# Patient Record
Sex: Female | Born: 1980 | Race: White | Hispanic: Yes | Marital: Married | Smoking: Former smoker
Health system: Southern US, Community
[De-identification: ages and names within clinical notes are randomized; demographics above are authoritative.]

## PROBLEM LIST (undated history)

## (undated) ENCOUNTER — Inpatient Hospital Stay (HOSPITAL_COMMUNITY): Payer: Self-pay

## (undated) DIAGNOSIS — F32A Depression, unspecified: Secondary | ICD-10-CM

## (undated) DIAGNOSIS — E119 Type 2 diabetes mellitus without complications: Secondary | ICD-10-CM

## (undated) DIAGNOSIS — IMO0002 Reserved for concepts with insufficient information to code with codable children: Secondary | ICD-10-CM

## (undated) DIAGNOSIS — A539 Syphilis, unspecified: Secondary | ICD-10-CM

## (undated) DIAGNOSIS — F419 Anxiety disorder, unspecified: Secondary | ICD-10-CM

## (undated) DIAGNOSIS — F3110 Bipolar disorder, current episode manic without psychotic features, unspecified: Secondary | ICD-10-CM

## (undated) DIAGNOSIS — A630 Anogenital (venereal) warts: Secondary | ICD-10-CM

## (undated) DIAGNOSIS — I10 Essential (primary) hypertension: Secondary | ICD-10-CM

## (undated) DIAGNOSIS — F329 Major depressive disorder, single episode, unspecified: Secondary | ICD-10-CM

## (undated) HISTORY — DX: Anogenital (venereal) warts: A63.0

## (undated) HISTORY — DX: Depression, unspecified: F32.A

## (undated) HISTORY — DX: Major depressive disorder, single episode, unspecified: F32.9

## (undated) HISTORY — DX: Type 2 diabetes mellitus without complications: E11.9

## (undated) HISTORY — DX: Syphilis, unspecified: A53.9

## (undated) HISTORY — DX: Essential (primary) hypertension: I10

## (undated) HISTORY — DX: Bipolar disorder, current episode manic without psychotic features, unspecified: F31.10

## (undated) HISTORY — DX: Reserved for concepts with insufficient information to code with codable children: IMO0002

---

## 2003-07-26 DIAGNOSIS — R87619 Unspecified abnormal cytological findings in specimens from cervix uteri: Secondary | ICD-10-CM

## 2003-07-26 DIAGNOSIS — IMO0002 Reserved for concepts with insufficient information to code with codable children: Secondary | ICD-10-CM

## 2003-07-26 HISTORY — DX: Reserved for concepts with insufficient information to code with codable children: IMO0002

## 2003-07-26 HISTORY — DX: Unspecified abnormal cytological findings in specimens from cervix uteri: R87.619

## 2008-03-04 ENCOUNTER — Other Ambulatory Visit: Admission: RE | Admit: 2008-03-04 | Discharge: 2008-03-04 | Payer: Self-pay | Admitting: Obstetrics and Gynecology

## 2008-03-22 ENCOUNTER — Emergency Department (HOSPITAL_COMMUNITY): Admission: EM | Admit: 2008-03-22 | Discharge: 2008-03-22 | Payer: Self-pay | Admitting: Emergency Medicine

## 2008-12-23 HISTORY — PX: CHOLECYSTECTOMY: SHX55

## 2008-12-29 ENCOUNTER — Emergency Department (HOSPITAL_COMMUNITY): Admission: EM | Admit: 2008-12-29 | Discharge: 2008-12-30 | Payer: Self-pay | Admitting: Emergency Medicine

## 2008-12-31 ENCOUNTER — Inpatient Hospital Stay (HOSPITAL_COMMUNITY): Admission: AD | Admit: 2008-12-31 | Discharge: 2009-01-03 | Payer: Self-pay

## 2009-01-01 ENCOUNTER — Encounter (INDEPENDENT_AMBULATORY_CARE_PROVIDER_SITE_OTHER): Payer: Self-pay | Admitting: General Surgery

## 2009-01-14 ENCOUNTER — Encounter: Admission: RE | Admit: 2009-01-14 | Discharge: 2009-01-14 | Payer: Self-pay | Admitting: General Surgery

## 2009-01-16 ENCOUNTER — Ambulatory Visit (HOSPITAL_COMMUNITY): Admission: RE | Admit: 2009-01-16 | Discharge: 2009-01-16 | Payer: Self-pay | Admitting: General Surgery

## 2009-06-29 ENCOUNTER — Ambulatory Visit (HOSPITAL_COMMUNITY): Admission: RE | Admit: 2009-06-29 | Discharge: 2009-06-29 | Payer: Self-pay | Admitting: Psychiatry

## 2010-11-01 LAB — CBC
HCT: 30.9 % — ABNORMAL LOW (ref 36.0–46.0)
HCT: 36.3 % (ref 36.0–46.0)
HCT: 38.4 % (ref 36.0–46.0)
Hemoglobin: 10.3 g/dL — ABNORMAL LOW (ref 12.0–15.0)
Hemoglobin: 12.2 g/dL (ref 12.0–15.0)
Hemoglobin: 12.9 g/dL (ref 12.0–15.0)
MCHC: 33.6 g/dL (ref 30.0–36.0)
RDW: 13.6 % (ref 11.5–15.5)
RDW: 14.2 % (ref 11.5–15.5)

## 2010-11-01 LAB — COMPREHENSIVE METABOLIC PANEL
AST: 49 U/L — ABNORMAL HIGH (ref 0–37)
Albumin: 3 g/dL — ABNORMAL LOW (ref 3.5–5.2)
Alkaline Phosphatase: 141 U/L — ABNORMAL HIGH (ref 39–117)
Alkaline Phosphatase: 85 U/L (ref 39–117)
Alkaline Phosphatase: 91 U/L (ref 39–117)
BUN: 5 mg/dL — ABNORMAL LOW (ref 6–23)
BUN: 8 mg/dL (ref 6–23)
BUN: 8 mg/dL (ref 6–23)
CO2: 27 mEq/L (ref 19–32)
CO2: 29 mEq/L (ref 19–32)
Calcium: 8.3 mg/dL — ABNORMAL LOW (ref 8.4–10.5)
Calcium: 9.1 mg/dL (ref 8.4–10.5)
Chloride: 107 mEq/L (ref 96–112)
Creatinine, Ser: 0.72 mg/dL (ref 0.4–1.2)
GFR calc Af Amer: >60 mL/min (ref >60)
GFR calc non Af Amer: >60 mL/min (ref >60)
GFR calc non Af Amer: >60 mL/min (ref >60)
GFR calc non Af Amer: >60 mL/min (ref >60)
Glucose, Bld: 103 mg/dL — ABNORMAL HIGH (ref 70–99)
Glucose, Bld: 145 mg/dL — ABNORMAL HIGH (ref 70–99)
Glucose, Bld: 90 mg/dL (ref 70–99)
Potassium: 3.9 mEq/L (ref 3.5–5.1)
Potassium: 4 mEq/L (ref 3.5–5.1)
Total Bilirubin: 0.5 mg/dL (ref 0.3–1.2)
Total Bilirubin: 1.1 mg/dL (ref 0.3–1.2)
Total Protein: 5.8 g/dL — ABNORMAL LOW (ref 6.0–8.3)
Total Protein: 6.1 g/dL (ref 6.0–8.3)
Total Protein: 7 g/dL (ref 6.0–8.3)

## 2010-11-01 LAB — DIFFERENTIAL
Basophils Relative: 0 % (ref 0–1)
Monocytes Relative: 5 % (ref 3–12)
Neutro Abs: 8.2 10*3/uL — ABNORMAL HIGH (ref 1.7–7.7)
Neutrophils Relative %: 69 % (ref 43–77)

## 2010-11-01 LAB — URINALYSIS, ROUTINE W REFLEX MICROSCOPIC
Ketones, ur: NEGATIVE mg/dL
Nitrite: NEGATIVE
Protein, ur: NEGATIVE mg/dL
Urobilinogen, UA: 1 mg/dL (ref 0.0–1.0)
pH: 5.5 (ref 5.0–8.0)

## 2010-11-01 LAB — LIPASE, BLOOD
Lipase: 14 U/L (ref 11–59)
Lipase: 18 U/L (ref 11–59)

## 2010-11-01 LAB — POCT PREGNANCY, URINE: Preg Test, Ur: NEGATIVE

## 2010-12-07 NOTE — Op Note (Signed)
NAME:  Diamond Pace, CHIHUAHUA NO.:  [id number]   MEDICAL RECORD NO.:  [id number]          PATIENT TYPE:  INP   LOCATION:  5151                         FACILITY:  [facility]   PHYSICIAN:  Almond Lint, MD       DATE OF BIRTH:  1981/04/16   DATE OF PROCEDURE:  01/01/2009  DATE OF DISCHARGE:                               OPERATIVE REPORT   PREOPERATIVE DIAGNOSIS:  Cholecystitis.   POSTOPERATIVE DIAGNOSIS:  Cholecystitis.   PROCEDURE PERFORMED:  Laparoscopic cholecystectomy.   SURGEON:  Almond Lint, MD   ASSISTANT:  Letha Cape, PA-C   ANESTHESIA:  General and local.   FINDINGS:  Inflamed, friable gallbladder and cystic duct.  This was  singly clipped as the duct tore apart when trying to apply the third  clip.   SPECIMEN:  Gallbladder to pathology.   ESTIMATED BLOOD LOSS:  Minimal.   COMPLICATIONS:  None known.   PROCEDURE:  Mr. Diamond Pace is a 30 year old female who was  identified in the holding area and taken to the operating room where she  was placed on supine on the operating room table.  General endotracheal  anesthesia was induced.  The abdomen was prepped and draped in a sterile  fashion.  Time-out was performed according to the surgical safety check  list.  When all was correct, we continued.  The infraumbilical skin was  anesthetized with a mixture of 1% lidocaine plain and 0.25% Marcaine  with epinephrine.  A transverse curvilinear incision was made  approximately 1.5 cm in length.  The subcutaneous tissues were divided  bluntly with a Kelly clamp.  The umbilical stalk was elevated with a  Kocher.  The fascia was noted to be slightly deeper so this was cleaned  off with retractors and grasped on either side of the midline.  A #11  blade was used to incise the midline.  A pursestring suture of 0 Vicryl  was placed around the fascial incision and Hasson trocar was introduced  into the abdomen.  The sutures were also used to secure the Hasson  to  the abdominal wall.  Pneumoperitoneum was achieved to a pressure of 15  mmHg.  The camera was introduced in the abdomen and the gallbladder was  immediately seen.  The epigastric region was identified and an incision  made after administration of local.  An 11-mm port was placed in the  epigastrium under direct visualization and two 5-mm ports were placed in  the right upper quadrant under direct visualization.  The gallbladder  was very difficult to retract as the patient's abdominal wall was very  thick and precluded good retraction.  The first attempts were made to  take the gallbladder dome down; however, the gallbladder was noted to be  very friable.  The peritoneum overlying the ductal structures was  removed and the [place] dissector in right angle were used to  skeletonize the cystic duct.  This was clipped once and the clip made a  hole in the gallbladder.  This was clipped again.  The gallbladder  continued to leak.  There was another clip placed inferior to these  clips and the  artery had been skeletonized and this was clipped and  transected as well.  Before an additional clip could be placed inferior  to the three clips on the duct, the cystic duct tore between the second  and third clips leaving only one clip present on the cystic duct stump.  The gallbladder was then taken off the gallbladder fossa with the Bovie  electrocautery.  There was no evidence of leakage from the clip that was  in place.  The gallbladder was removed from the abdominal cavity with  the EndoCatch bag through the umbilical incision.  The right upper  quadrant was irrigated copiously as well as along the right paracolic  gutter where the bile had spilled.  The gallbladder fossa was  reexamined.  There was no bleeding seen.  The cystic duct stump and the  cystic artery stump were examined and there was no leakage of bile or  bleeding seen at the clips.  There was a brief attempt to place an   additional clip on the cystic duct stump.  However, the periductal fatty  tissues began to come off and it was felt that it would be more prudent  to leave the ductal alone with the one clip in place.  The  pneumoperitoneum was then allowed to evacuate and the right upper  quadrant trocar and epigastric trocar was removed.  The fascia at the  umbilicus was closed with a pursestring suture with no additional defect  palpated.  The skin at each incision was closed with 4-0 Monocryl.  The  needle and sponge counts were correct x2.  Almond Lint, MD  Electronically Signed     FB/MEDQ  D:  01/01/2009  T:  01/02/2009  Job:  [id number]

## 2010-12-07 NOTE — H&P (Signed)
NAME:  Diamond Pace, Diamond Pace NO.:  [id number]   MEDICAL RECORD NO.:  [id number]          PATIENT TYPE:  INP   LOCATION:  5151                         FACILITY:  [facility]   PHYSICIAN:  Adolph Pollack, M.D.DATE OF BIRTH:  February 08, 1981   DATE OF ADMISSION:  12/31/2008  DATE OF DISCHARGE:                              HISTORY & PHYSICAL   CHIEF COMPLAINT:  Severe right upper quadrant pain, nausea, and  vomiting.   HISTORY:  This is a 30 year old female who presented to the emergency  department about a day to day and half ago with complaints as above with  right upper quadrant pain that is severe radiating to the back, nausea,  and vomiting.  While she was there she was noted to have significant  right upper quadrant tenderness, white cell count of 11,600 and  elevation of her SGOT and SGPT.  She was diagnosed with a presumptive  gallbladder disease.  No ultrasound was done.  She was subsequently sent  over here for further evaluation.  She states she continues to have the  pain and cannot keep anything down.  No fever.   PAST MEDICAL HISTORY:  No chronic illnesses.   OPERATIONS:  None.   ALLERGIES:  None.   MEDICATIONS:  Oxycodone, Zantac, and Phenergan.   SOCIAL HISTORY:  Married, 1 child, smokes half a pack of cigarettes a  day, no alcohol use.  Works as an Proofreader.   FAMILY HISTORY:  Notable for cancer and high blood pressure.   REVIEW OF SYSTEMS:  GENERAL:  She reports having some chills and night  sweats.  CARDIAC:  Negative.  PULMONARY:  Negative.  ENDOCRINE:  Negative.  HEMATOLOGIC:  Negative.  GU:  Negative.  NEUROLOGIC:  Has  intermittent headaches.   PHYSICAL EXAMINATION:  GENERAL:  Morbidly obese female, being 6 feet and  weighing 364 pounds.  Appears to be uncomfortable.  VITAL SIGNS:  Temperature is 97.6, blood pressure is 120/80, and pulse  is 64.  EYES:  Extraocular motions are intact.  No icterus.  RESPIRATORY:  Breath sounds  equal and clear.  Respirations unlabored.  CARDIOVASCULAR:  Regular rate, regular rhythm.  No murmur.  ABDOMEN:  Soft and obese with right upper quadrant tenderness and  guarding.  No obvious mass.  MUSCULOSKELETAL:  Some trace pretibial edema.   LABORATORY DATA:  White count 11,600.  SGOT is 94, SGPT is 55, total  bilirubin is 0.5, and alk phos is 85.  Lipase 18.   Urine pregnancy test negative.   IMPRESSION:  Right upper quadrant pain clinically and laboratory  consistent with evolving acute cholecystitis.   PLAN:  We will admit to the hospital.  We will get a stat abdominal  ultrasound.  Empirically, start IV antibiotics.  Very likely, she will  need a cholecystectomy in admission.      Adolph Pollack, M.D.  Electronically Signed     TJR/MEDQ  D:  12/31/2008  T:  01/01/2009  Job:  [id number]

## 2010-12-10 NOTE — Discharge Summary (Signed)
NAME:  Diamond Pace, Diamond Pace NO.:  [id number]   MEDICAL RECORD NO.:  [id number]          PATIENT TYPE:  INP   LOCATION:  5151                         FACILITY:  [facility]   PHYSICIAN:  Currie Paris, M.D.DATE OF BIRTH:  1981/01/08   DATE OF ADMISSION:  12/31/2008  DATE OF DISCHARGE:  01/03/2009                               DISCHARGE SUMMARY   ADMITTING PHYSICIAN:  Adolph Pollack, MD   OPERATING PHYSICIAN:  Almond Lint, MD   CHIEF COMPLAINT/REASON FOR ADMISSION:  Mr. Derrell Lolling is a 30 year old  female patient who had come to the ER on June 26, 27, and 28 with  complaints of right upper quadrant pain and nausea and vomiting.  Pain  radiating to the back.  Apparently at those visit it was stable enough  to go home.  She has had not any fever, but in the interim she has been  unable to keep any food or liquids down and ultrasound had not been done  on those admissions.  On exam, Dr. Abbey Chatters found the patient whose  abdomen was soft with right upper quadrant tenderness and guarding.  She  is obese.  Her white count was 11,900, SGOT 94, SGPT 55.  She was found  to have a diagnosis of acute cholecystitis on clinical exam.   HOSPITAL COURSE:  The patient was admitted, started on empiric IV  antibiotic therapy, and abdominal ultrasound was ordered to help clarify  diagnosis since this had not yet been done.  The ultrasound showed  common bile duct within normal limits, increased echo texture in the  liver, multiple stones with gallbladder wall thickening with a positive  sonographic Murphy sign.   The patient was subsequently taken to the OR on January 01, 2009, where she  underwent laparoscopic cholecystectomy by Dr. Donell Beers.  During the  procedure, the patient was found to have an inflamed friable gallbladder  and cystic duct singly clipped, it was very difficult to mobilize the  duct due to the small size.  Given these findings intraoperatively, Dr.  Donell Beers wanted  the patient followed closely for possible bile leak in the  postoperative period, so clinical exam and serial LFTs were followed.   By postoperative day #1, the patient was afebrile and vital signs were  stable.  Her white count was normal.  Her LFTs were trending down.  Total bilirubin was normal, but she was having increasing right upper  quadrant pain radiating to the back, which was similar to her  preoperative pain.  She was tolerating her diet and oral pain  medications, but she was exclusively tender with voluntary guarding in  the right upper quadrant.  We continued to watch her closely to make  sure she was not having signs of a bile leak.  By January 03, 2009,  postoperative day #2, Dr. Jamey Ripa evaluated the patient.  Her right upper  quadrant pain had diminished greatly and she was tolerating a diet.  Her  bowel sounds were present and it was determined that she was otherwise  deemed appropriate for discharge home.   FINAL DISCHARGE DIAGNOSIS:  Right upper quadrant abdominal pain  secondary to acute cholecystitis  status post laparoscopic  cholecystectomy with other issues as described.   DISCHARGE MEDICATIONS:  1. Oxycodone 5/325, 1-2 tablets every 4-6 hours as needed for pain.  2. Promethazine 25 mg every 4-6 hours as needed for nausea.  3. Zantac 75 mg daily.   ADDITIONAL INSTRUCTIONS:  No changes in diet.  The patient has Dermabond  over incisions.   ACTIVITY:  Increase activity slowly.  May walk up steps.  May shower.  No lifting for 2 weeks.  No driving for 2 weeks.   FOLLOWUP APPOINTMENT:  She needs to see Dr. Donell Beers in 2 weeks.  She  needs to call for an appointment.      Allison L. Rennis Harding, N.P.      Currie Paris, M.D.  Electronically Signed    ALE/MEDQ  D:  01/20/2009  T:  01/21/2009  Job:  [id number]   cc:   Almond Lint, MD

## 2011-11-07 ENCOUNTER — Other Ambulatory Visit: Payer: Self-pay | Admitting: Physician Assistant

## 2011-11-16 ENCOUNTER — Other Ambulatory Visit: Payer: Self-pay | Admitting: Physician Assistant

## 2011-12-15 ENCOUNTER — Other Ambulatory Visit: Payer: Self-pay | Admitting: Family Medicine

## 2011-12-15 ENCOUNTER — Other Ambulatory Visit: Payer: Self-pay | Admitting: Physician Assistant

## 2012-10-03 DIAGNOSIS — E785 Hyperlipidemia, unspecified: Secondary | ICD-10-CM | POA: Insufficient documentation

## 2012-10-03 DIAGNOSIS — I872 Venous insufficiency (chronic) (peripheral): Secondary | ICD-10-CM | POA: Insufficient documentation

## 2012-12-18 ENCOUNTER — Encounter: Payer: Self-pay | Admitting: Certified Nurse Midwife

## 2012-12-19 ENCOUNTER — Encounter: Payer: Self-pay | Admitting: Certified Nurse Midwife

## 2012-12-19 ENCOUNTER — Ambulatory Visit (INDEPENDENT_AMBULATORY_CARE_PROVIDER_SITE_OTHER): Payer: BC Managed Care – PPO | Admitting: Certified Nurse Midwife

## 2012-12-19 VITALS — BP 120/74 | HR 68 | Wt 379.0 lb

## 2012-12-19 DIAGNOSIS — N912 Amenorrhea, unspecified: Secondary | ICD-10-CM

## 2012-12-19 LAB — POCT URINE PREGNANCY: Preg Test, Ur: POSITIVE

## 2012-12-19 LAB — HCG, QUANTITATIVE, PREGNANCY: hCG, Beta Chain, Quant, S: 17.13 m[IU]/mL

## 2012-12-19 MED ORDER — CONCEPT OB 130-92.4-1 MG PO CAPS: 1.0000 | ORAL_CAPSULE | Freq: Every day | ORAL | Status: DC

## 2012-12-19 NOTE — Progress Notes (Signed)
  32 y.o.Married Hispanic female [id number] presents with no menses sinceMarch 28 which was spotting. Normal period 09-01-12 with consistent Micronor use. Has been on weight loss and PCP management for diabetes, hypertension, and depression.  Currently periods are occurring every month prior with Micronor use since 04/2012.  Periods were not regular in the past, occurring every 3 -6 months . Patient reports fatigued, loss of appetite and nipple tenderness.. Reports oral progesterone-only contraceptive, compliance until March with sporadic use. March 28 had spotting,no bleeding since that time., Has been sexually active without condom use. Has not taken her medications today for hypertension, diabetes and depression.  Wondered if she might be pregnant, due to not feeling herself. Urine pregnancy positive - very faint. No partner change in the past ? 6  Months. Not sure how she feels about possibility of pregnancy.   General appearance: alert, cooperative and morbidly obese Pelvic: External genital: normal BUS: negative Vagina: normal appearance, normal discharge Cervix: non tender, normal appearance Uterus: non tender,difficult to palpate due to body habitus Adnexa: no large masses, non tender, hard to palpate due to body habitus Perineal area: no lesions  Assessment:1-  Amenorrhea with very faint positive UPT 2-History of irregular cycles with Micronor use for contraception and cycle control 3-Micronor compliance issue for month of April 2014 4- History of SAB and ectopic( medically managed) 5-Type II Diabetes on medication 6-Hypertension on medication 7-Depression on medication 8-Unplanned pregnancy  Plan:1- Discussed with patient factors that may be contributory to menstrual abnormalities include hormonal factors non compliance of pills. Normal limited pelvic exam due morbid obesity.  Patient aware of limitations.  2-Patient unsure of feelings of questionable positive UPT Recommend Serum HCG  Quant.. Patient agreeable Labs: HCG quant. Discussed with patient all the medications she is taking and category of medications with pregnancy  and her health risk if pregnant. Medications will need to be altered or discontinued if results positive.  Patient will probably need high risk clinic management. Will hold on medications until lab back, patient has not been taking daily. Given prenatal vitamin sample to start [facility] one daily. Consulted with Dr Farrel Gobble regarding patient and she will follow up with her and agrees with management at this point. Patient advised to notify if vaginal bleeding or cramping.   Rv prn as ablove      Reviewed, TL

## 2012-12-20 ENCOUNTER — Other Ambulatory Visit: Payer: Self-pay | Admitting: Gynecology

## 2012-12-20 ENCOUNTER — Telehealth: Payer: Self-pay | Admitting: Certified Nurse Midwife

## 2012-12-20 DIAGNOSIS — N912 Amenorrhea, unspecified: Secondary | ICD-10-CM

## 2012-12-20 NOTE — Telephone Encounter (Signed)
Patient stated that she is waiting on a call from Joy with results from pregnancy test results. Patient stated that she needs to know if she is pregnant or not so that she can take her meds if she is not pregnant. Patient stated that she has not taken her meds since 1 pm 12/18/12.

## 2012-12-20 NOTE — Telephone Encounter (Signed)
Repeat HCG (STAT) scheduled for 12/21/12

## 2012-12-20 NOTE — Telephone Encounter (Signed)
Patient saw Debbi yesterday for amenorrhea and had faint pos UPT here. HCG 17, pt calling for results. Please advise.

## 2012-12-20 NOTE — Telephone Encounter (Signed)
Pt calling to schedule another appt for repeat hcg

## 2012-12-21 ENCOUNTER — Other Ambulatory Visit (INDEPENDENT_AMBULATORY_CARE_PROVIDER_SITE_OTHER): Payer: BC Managed Care – PPO

## 2012-12-21 DIAGNOSIS — N912 Amenorrhea, unspecified: Secondary | ICD-10-CM

## 2012-12-24 ENCOUNTER — Other Ambulatory Visit (INDEPENDENT_AMBULATORY_CARE_PROVIDER_SITE_OTHER): Payer: BC Managed Care – PPO

## 2012-12-24 ENCOUNTER — Other Ambulatory Visit: Payer: Self-pay | Admitting: Gynecology

## 2012-12-24 ENCOUNTER — Encounter: Payer: Self-pay | Admitting: Certified Nurse Midwife

## 2012-12-24 ENCOUNTER — Other Ambulatory Visit: Payer: Self-pay | Admitting: Orthopedic Surgery

## 2012-12-24 DIAGNOSIS — O09291 Supervision of pregnancy with other poor reproductive or obstetric history, first trimester: Secondary | ICD-10-CM

## 2012-12-24 DIAGNOSIS — N912 Amenorrhea, unspecified: Secondary | ICD-10-CM

## 2012-12-24 LAB — HCG, QUANTITATIVE, PREGNANCY: hCG, Beta Chain, Quant, S: 326 m[IU]/mL

## 2012-12-25 ENCOUNTER — Encounter: Payer: Self-pay | Admitting: Certified Nurse Midwife

## 2012-12-25 ENCOUNTER — Ambulatory Visit (INDEPENDENT_AMBULATORY_CARE_PROVIDER_SITE_OTHER): Payer: BC Managed Care – PPO | Admitting: Certified Nurse Midwife

## 2012-12-25 VITALS — BP 110/70 | HR 72 | Temp 97.9°F | Resp 16

## 2012-12-25 DIAGNOSIS — Z3201 Encounter for pregnancy test, result positive: Secondary | ICD-10-CM

## 2012-12-25 DIAGNOSIS — O200 Threatened abortion: Secondary | ICD-10-CM

## 2012-12-25 DIAGNOSIS — N912 Amenorrhea, unspecified: Secondary | ICD-10-CM

## 2012-12-25 DIAGNOSIS — E119 Type 2 diabetes mellitus without complications: Secondary | ICD-10-CM

## 2012-12-25 DIAGNOSIS — R350 Frequency of micturition: Secondary | ICD-10-CM

## 2012-12-25 LAB — POCT URINALYSIS DIPSTICK
Bilirubin, UA: NEGATIVE
Blood, UA: NEGATIVE
Glucose, UA: NEGATIVE
Nitrite, UA: NEGATIVE
Urobilinogen, UA: NEGATIVE
pH, UA: 5

## 2012-12-25 NOTE — Progress Notes (Signed)
32 yo married(but not together)hispanic female g3 p1021 with positive UPT and quantative HCG that is increasing, here complaining of cramping sporadic more so on left side, but occurs on right side.  Denies bleeding, vaginal discharge change or abdominal pain.  Denies nausea or vomiting, urinary frequency that is different, diarrhea,fever or chills.  Still has increasing breast and nipple tenderness. Taking prenatal vitamins, Aldomet for hypertension, recently changed form Lisinopril and Metformin HCL 500 mg.  Eating well, some fatigue. Describes discomfort as sporadic sharp quick pain,"almost like period getting ready to start". Checking blood glucose at home, all normal. LMP:10/17/12 history of irregular cycles  O: Healthy morbid obese  Affect: normal, orientation X3 Vital signs :110/70, P-72 R- 16  Urine essentially negative Temp:97.9 HCG: 5/28-17.13 5/30-49.7 6/2-326  Progesterone- 6  Exam: Abdomen: soft, no rebound, points to LLQ as feeling bruised when pain occurs.  No point of pain identified. Pelvic Exam: External genital area: normal no lesions BUS: negative Vagina: scant white discharge, no blood noted Cervix: closed, non tender, no blood present Uterus: non tender,mid position, difficult to palpate due to body habitus. Adnexae: non tender to touch, no large masses noted,due to body habitus Perineal area: no lesions  A:Early pregnancy with consistent increasing HCG levels? 5 weeks  No physical signs of threatened miscarriage per exam. Limited normal pelvic exam due to morbid obesity Unplanned pregnancy, but happy with partner support History of ectopic pregnancy with medication management Type 2 Diabetes on Metformin, good control Hypertension on Aldomet  P:Reviewed findings of no evidence of threatened miscarriage.  Warning signs and symptoms given and need to evaluate if occurs. Lab: HCG quant. And Hgb A1-c Discussed limitations of exam and PUS limitations at this  point. Continue medications as prescribed.  Continue good water intake. Consulted with Dr. Farrel Gobble who agrees with plan. Patient was seen by Dr.Lathrop who discussed concerns also with her.  Rv 12-28-12 repeat quant. Per Dr Farrel Gobble  Rv prn  Reviewed, TL

## 2012-12-26 ENCOUNTER — Encounter: Payer: Self-pay | Admitting: Gynecology

## 2012-12-27 ENCOUNTER — Other Ambulatory Visit: Payer: Self-pay | Admitting: Certified Nurse Midwife

## 2012-12-27 DIAGNOSIS — Z3201 Encounter for pregnancy test, result positive: Secondary | ICD-10-CM

## 2012-12-28 ENCOUNTER — Ambulatory Visit (INDEPENDENT_AMBULATORY_CARE_PROVIDER_SITE_OTHER): Payer: BC Managed Care – PPO | Admitting: Certified Nurse Midwife

## 2012-12-28 ENCOUNTER — Encounter: Payer: Self-pay | Admitting: Certified Nurse Midwife

## 2012-12-28 DIAGNOSIS — Z3201 Encounter for pregnancy test, result positive: Secondary | ICD-10-CM

## 2012-12-31 ENCOUNTER — Telehealth: Payer: Self-pay | Admitting: Certified Nurse Midwife

## 2012-12-31 NOTE — Telephone Encounter (Signed)
Patient is waiting to here about her appointment for [facility]

## 2012-12-31 NOTE — Telephone Encounter (Signed)
U/S appointment scheduled for 01/01/13 cm

## 2013-01-01 ENCOUNTER — Ambulatory Visit (INDEPENDENT_AMBULATORY_CARE_PROVIDER_SITE_OTHER): Payer: BC Managed Care – PPO

## 2013-01-01 ENCOUNTER — Ambulatory Visit (INDEPENDENT_AMBULATORY_CARE_PROVIDER_SITE_OTHER): Payer: BC Managed Care – PPO | Admitting: Gynecology

## 2013-01-01 ENCOUNTER — Other Ambulatory Visit: Payer: Self-pay | Admitting: Gynecology

## 2013-01-01 VITALS — BP 128/78

## 2013-01-01 DIAGNOSIS — N912 Amenorrhea, unspecified: Secondary | ICD-10-CM

## 2013-01-01 DIAGNOSIS — O09291 Supervision of pregnancy with other poor reproductive or obstetric history, first trimester: Secondary | ICD-10-CM

## 2013-01-01 DIAGNOSIS — O09299 Supervision of pregnancy with other poor reproductive or obstetric history, unspecified trimester: Secondary | ICD-10-CM

## 2013-01-01 DIAGNOSIS — N831 Corpus luteum cyst of ovary, unspecified side: Secondary | ICD-10-CM

## 2013-01-01 DIAGNOSIS — O0911 Supervision of pregnancy with history of ectopic or molar pregnancy, first trimester: Secondary | ICD-10-CM | POA: Insufficient documentation

## 2013-01-01 DIAGNOSIS — Z3201 Encounter for pregnancy test, result positive: Secondary | ICD-10-CM

## 2013-01-01 DIAGNOSIS — I10 Essential (primary) hypertension: Secondary | ICD-10-CM | POA: Insufficient documentation

## 2013-01-01 DIAGNOSIS — E119 Type 2 diabetes mellitus without complications: Secondary | ICD-10-CM

## 2013-01-01 DIAGNOSIS — Z794 Long term (current) use of insulin: Secondary | ICD-10-CM | POA: Insufficient documentation

## 2013-01-01 DIAGNOSIS — O9989 Other specified diseases and conditions complicating pregnancy, childbirth and the puerperium: Secondary | ICD-10-CM

## 2013-01-01 DIAGNOSIS — O0910 Supervision of pregnancy with history of ectopic or molar pregnancy, unspecified trimester: Secondary | ICD-10-CM

## 2013-01-01 LAB — US OB TRANSVAGINAL

## 2013-01-01 MED ORDER — PROGESTERONE 50 MG VA SUPP: 1.0000 | Freq: Two times a day (BID) | VAGINAL | Status: DC

## 2013-01-01 NOTE — Progress Notes (Signed)
Pt here for viabiltiy u/s, BHCG's rising appropriately but P4 remains low, was 6 now 4.1.  Pt with history of irregular menses in past. U/S images reviewed with pt and daughter, gestational sac appropriately placed in cavity, yolk sac seen, no fetal pole was noted, no mass in either adnexa, CL cyst on left. Pt's discomfort earlier was on left, now assured with CL cyst. Discussed concerns regarding low progesterone on 2 samples, recommend supplementing progesterone vaginally until viability can be confirmed, pt is aware this will distort her symptoms. Pt instructed on placement, we will rx enough to get to next u/s next week, and will refill if fetal pole seen. BP good today on aldomet, she is to continue her metformin.

## 2013-01-04 DIAGNOSIS — K219 Gastro-esophageal reflux disease without esophagitis: Secondary | ICD-10-CM | POA: Insufficient documentation

## 2013-01-10 ENCOUNTER — Other Ambulatory Visit: Payer: Self-pay | Admitting: Gynecology

## 2013-01-10 ENCOUNTER — Encounter: Payer: Self-pay | Admitting: Certified Nurse Midwife

## 2013-01-10 MED ORDER — PROGESTERONE 50 MG VA SUPP: 1.0000 | Freq: Two times a day (BID) | VAGINAL | Status: DC

## 2013-01-14 ENCOUNTER — Telehealth: Payer: Self-pay | Admitting: *Deleted

## 2013-01-14 NOTE — Telephone Encounter (Addendum)
Patient is ready to schedule pus appointment. Patients says Dr. Farrel Gobble told her she could have her pus today 01/14/13 or Wed. 6/252014. Patient says no one has called her to schedule.

## 2013-01-14 NOTE — Telephone Encounter (Signed)
Patient calling about ultrasound appointment that Dr Farrel Gobble told her would be this week.  Patient states she needs to know this now because "she is putting her job in jeopardy". Advised i could not speak to the job status but PUS scheduled for tomm at 5pm as last available appt this week for PUS.

## 2013-01-14 NOTE — Telephone Encounter (Signed)
This call (originally on 12-31-12) was resolved with appt on 01-01-13.  Please see next phone note regarding next PUS appt. @ week follow up appt is scheduled for 01-15-13.

## 2013-01-15 ENCOUNTER — Ambulatory Visit (INDEPENDENT_AMBULATORY_CARE_PROVIDER_SITE_OTHER): Payer: BC Managed Care – PPO | Admitting: Gynecology

## 2013-01-15 ENCOUNTER — Other Ambulatory Visit: Payer: Self-pay | Admitting: *Deleted

## 2013-01-15 ENCOUNTER — Ambulatory Visit (INDEPENDENT_AMBULATORY_CARE_PROVIDER_SITE_OTHER): Payer: BC Managed Care – PPO

## 2013-01-15 DIAGNOSIS — N912 Amenorrhea, unspecified: Secondary | ICD-10-CM

## 2013-01-15 DIAGNOSIS — I10 Essential (primary) hypertension: Secondary | ICD-10-CM

## 2013-01-15 DIAGNOSIS — O9989 Other specified diseases and conditions complicating pregnancy, childbirth and the puerperium: Secondary | ICD-10-CM

## 2013-01-15 DIAGNOSIS — E119 Type 2 diabetes mellitus without complications: Secondary | ICD-10-CM

## 2013-01-15 DIAGNOSIS — O09299 Supervision of pregnancy with other poor reproductive or obstetric history, unspecified trimester: Secondary | ICD-10-CM

## 2013-01-15 DIAGNOSIS — Z3201 Encounter for pregnancy test, result positive: Secondary | ICD-10-CM

## 2013-01-15 DIAGNOSIS — O09291 Supervision of pregnancy with other poor reproductive or obstetric history, first trimester: Secondary | ICD-10-CM

## 2013-01-15 LAB — US OB TRANSVAGINAL

## 2013-01-15 MED ORDER — PROGESTERONE 50 MG VA SUPP: 1.0000 | Freq: Two times a day (BID) | VAGINAL | Status: DC

## 2013-01-15 NOTE — Patient Instructions (Signed)
Please make appt with [facility] doctor

## 2013-01-15 NOTE — Progress Notes (Signed)
Pt here for viability u/s last scan failed to show a fetal pole, pt was found to have low progesterone of 6 and repeat also low 4.1, she is on supplemental vaginal progesterone twice a day. U/s shows an SIUP, [redacted]w[redacted]d c/w LMP, FHR 158 well positioned in fundus Pt is pleasesd. We suggest she continue the progesterone support for the first trimester and she is agreeable, we refilled #30 x2. We encourage her to make an appointment with OB, dicussued high risk based on HTN, DM but good HbA1C, recommend she discuss with new MD regarding diet and exercise.  Pt is doing well on new BP medication Congradulations given

## 2013-01-17 ENCOUNTER — Encounter (HOSPITAL_COMMUNITY): Payer: Self-pay | Admitting: *Deleted

## 2013-01-17 ENCOUNTER — Inpatient Hospital Stay (HOSPITAL_COMMUNITY)
Admission: AD | Admit: 2013-01-17 | Discharge: 2013-01-17 | Disposition: A | Discharge status: Home / Self Care | Payer: BC Managed Care – PPO | Source: Ambulatory Visit | Attending: Gynecology | Admitting: Gynecology

## 2013-01-17 ENCOUNTER — Inpatient Hospital Stay (HOSPITAL_COMMUNITY): Payer: BC Managed Care – PPO

## 2013-01-17 DIAGNOSIS — R109 Unspecified abdominal pain: Secondary | ICD-10-CM | POA: Insufficient documentation

## 2013-01-17 DIAGNOSIS — O4690 Antepartum hemorrhage, unspecified, unspecified trimester: Secondary | ICD-10-CM

## 2013-01-17 DIAGNOSIS — M549 Dorsalgia, unspecified: Secondary | ICD-10-CM | POA: Insufficient documentation

## 2013-01-17 DIAGNOSIS — O24919 Unspecified diabetes mellitus in pregnancy, unspecified trimester: Secondary | ICD-10-CM | POA: Insufficient documentation

## 2013-01-17 DIAGNOSIS — Y92009 Unspecified place in unspecified non-institutional (private) residence as the place of occurrence of the external cause: Secondary | ICD-10-CM | POA: Insufficient documentation

## 2013-01-17 DIAGNOSIS — E119 Type 2 diabetes mellitus without complications: Secondary | ICD-10-CM | POA: Insufficient documentation

## 2013-01-17 DIAGNOSIS — O99891 Other specified diseases and conditions complicating pregnancy: Secondary | ICD-10-CM | POA: Insufficient documentation

## 2013-01-17 DIAGNOSIS — O10019 Pre-existing essential hypertension complicating pregnancy, unspecified trimester: Secondary | ICD-10-CM | POA: Insufficient documentation

## 2013-01-17 DIAGNOSIS — IMO0002 Reserved for concepts with insufficient information to code with codable children: Secondary | ICD-10-CM | POA: Insufficient documentation

## 2013-01-17 DIAGNOSIS — O209 Hemorrhage in early pregnancy, unspecified: Secondary | ICD-10-CM | POA: Insufficient documentation

## 2013-01-17 HISTORY — DX: Major depressive disorder, single episode, unspecified: F32.9

## 2013-01-17 HISTORY — DX: Depression, unspecified: F32.A

## 2013-01-17 HISTORY — DX: Anxiety disorder, unspecified: F41.9

## 2013-01-17 LAB — WET PREP, GENITAL
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
## 2013-01-29 DIAGNOSIS — Z3490 Encounter for supervision of normal pregnancy, unspecified, unspecified trimester: Secondary | ICD-10-CM | POA: Insufficient documentation

## 2013-01-29 NOTE — Progress Notes (Signed)
Patient not seen.

## 2013-01-30 ENCOUNTER — Encounter: Payer: Self-pay | Admitting: Gynecology

## 2013-02-05 ENCOUNTER — Ambulatory Visit (INDEPENDENT_AMBULATORY_CARE_PROVIDER_SITE_OTHER): Payer: BC Managed Care – PPO | Admitting: Family Medicine

## 2013-02-05 VITALS — BP 159/92 | Temp 98.8°F | Wt 387.4 lb

## 2013-02-05 DIAGNOSIS — O99211 Obesity complicating pregnancy, first trimester: Secondary | ICD-10-CM

## 2013-02-05 DIAGNOSIS — O99210 Obesity complicating pregnancy, unspecified trimester: Secondary | ICD-10-CM | POA: Insufficient documentation

## 2013-02-05 DIAGNOSIS — O24919 Unspecified diabetes mellitus in pregnancy, unspecified trimester: Secondary | ICD-10-CM

## 2013-02-05 DIAGNOSIS — O10019 Pre-existing essential hypertension complicating pregnancy, unspecified trimester: Secondary | ICD-10-CM

## 2013-02-05 DIAGNOSIS — O10011 Pre-existing essential hypertension complicating pregnancy, first trimester: Secondary | ICD-10-CM

## 2013-02-05 DIAGNOSIS — O0990 Supervision of high risk pregnancy, unspecified, unspecified trimester: Secondary | ICD-10-CM

## 2013-02-05 DIAGNOSIS — O24911 Unspecified diabetes mellitus in pregnancy, first trimester: Secondary | ICD-10-CM

## 2013-02-05 DIAGNOSIS — E669 Obesity, unspecified: Secondary | ICD-10-CM

## 2013-02-05 LAB — POCT URINALYSIS DIP (DEVICE)
Ketones, ur: NEGATIVE mg/dL
Leukocytes, UA: NEGATIVE
Protein, ur: NEGATIVE mg/dL
Specific Gravity, Urine: 1.02 (ref 1.005–1.030)
Urobilinogen, UA: 0.2 mg/dL (ref 0.0–1.0)
pH: 6 (ref 5.0–8.0)

## 2013-02-05 MED ORDER — ACCU-CHEK FASTCLIX LANCETS MISC: 1.0000 [IU] | Freq: Four times a day (QID) | Status: DC

## 2013-02-05 MED ORDER — GLUCOSE BLOOD VI STRP: ORAL_STRIP | Status: DC

## 2013-02-05 MED ORDER — ACCU-CHEK NANO SMARTVIEW W/DEVICE KIT: 1.0000 | PACK | Status: DC

## 2013-02-05 NOTE — Progress Notes (Signed)
Pulse- 84  Edema-feet  Pain-"mild cramps" Weight gain 11-20lbs  New ob packet given

## 2013-02-05 NOTE — Patient Instructions (Addendum)
Following an appropriate diet and keeping your blood sugar under control is the most important thing to do for your health and that of your unborn baby.  Please check your blood sugar 4 times daily.  Please keep accurate BS logs and bring them with you to every visit.  Please bring your meter also.  Goals for Blood sugar should be: 1. Fasting (first thing in the morning before eating) should be less than 90.   2.  2 hours after meals should be less than 120.  Please eat 3 meals and 3 snacks.  Include protein (meat, dairy-cheese, eggs, nuts) with all meals.  Be mindful that carbohydrates increase your blood sugar.  Not just sweet food (cookies, cake, donuts, fruit, juice, soda) but also bread, pasta, rice, and potatoes.  You have to limit how many carbs you are eating.  Adding exercise, as little as 30 minutes a day can decrease your blood sugar.  Pregnancy - First Trimester During sexual intercourse, millions of sperm go into the vagina. Only 1 sperm will penetrate and fertilize the female egg while it is in the Fallopian tube. One week later, the fertilized egg implants into the wall of the uterus. An embryo begins to develop into a baby. At 6 to 8 weeks, the eyes and face are formed and the heartbeat can be seen on ultrasound. At the end of 12 weeks (first trimester), all the baby's organs are formed. Now that you are pregnant, you will want to do everything you can to have a healthy baby. Two of the most important things are to get good prenatal care and follow your caregiver's instructions. Prenatal care is all the medical care you receive before the baby's birth. It is given to prevent, find, and treat problems during the pregnancy and childbirth. PRENATAL EXAMS  During prenatal visits, your weight, blood pressure, and urine are checked. This is done to make sure you are healthy and progressing normally during the pregnancy.  A pregnant woman should gain 25 to 35 pounds during the  pregnancy. However, if you are overweight or underweight, your caregiver will advise you regarding your weight.  Your caregiver will ask and answer questions for you.  Blood work, cervical cultures, other necessary tests, and a Pap test are done during your prenatal exams. These tests are done to check on your health and the probable health of your baby. Tests are strongly recommended and done for HIV with your permission. This is the virus that causes AIDS. These tests are done because medicines can be given to help prevent your baby from being born with this infection should you have been infected without knowing it. Blood work is also used to find out your blood type, previous infections, and follow your blood levels (hemoglobin).  Low hemoglobin (anemia) is common during pregnancy. Iron and vitamins are given to help prevent this. Later in the pregnancy, blood tests for diabetes will be done along with any other tests if any problems develop.  You may need other tests to make sure you and the baby are doing well. CHANGES DURING THE FIRST TRIMESTER  Your body goes through many changes during pregnancy. They vary from person to person. Talk to your caregiver about changes you notice and are concerned about. Changes can include:  Your menstrual period stops.  The egg and sperm carry the genes that determine what you look like. Genes from you and your partner are forming a baby. The female genes determine whether the baby  is a boy or a girl.  Your body increases in girth and you may feel bloated.  Feeling sick to your stomach (nauseous) and throwing up (vomiting). If the vomiting is uncontrollable, call your caregiver.  Your breasts will begin to enlarge and become tender.  Your nipples may stick out more and become darker.  The need to urinate more. Painful urination may mean you have a bladder infection.  Tiring easily.  Loss of appetite.  Cravings for certain kinds of food.  At  first, you may gain or lose a couple of pounds.  You may have changes in your emotions from day to day (excited to be pregnant or concerned something may go wrong with the pregnancy and baby).  You may have more vivid and strange dreams. HOME CARE INSTRUCTIONS   It is very important to avoid all smoking, alcohol and non-prescribed drugs during your pregnancy. These affect the formation and growth of the baby. Avoid chemicals while pregnant to ensure the delivery of a healthy infant.  Start your prenatal visits by the 12th week of pregnancy. They are usually scheduled monthly at first, then more often in the last 2 months before delivery. Keep your caregiver's appointments. Follow your caregiver's instructions regarding medicine use, blood and lab tests, exercise, and diet.  During pregnancy, you are providing food for you and your baby. Eat regular, well-balanced meals. Choose foods such as meat, fish, milk and other low fat dairy products, vegetables, fruits, and whole-grain breads and cereals. Your caregiver will tell you of the ideal weight gain.  You can help morning sickness by keeping soda crackers at the bedside. Eat a couple before arising in the morning. You may want to use the crackers without salt on them.  Eating 4 to 5 small meals rather than 3 large meals a day also may help the nausea and vomiting.  Drinking liquids between meals instead of during meals also seems to help nausea and vomiting.  A physical sexual relationship may be continued throughout pregnancy if there are no other problems. Problems may be early (premature) leaking of amniotic fluid from the membranes, vaginal bleeding, or belly (abdominal) pain.  Exercise regularly if there are no restrictions. Check with your caregiver or physical therapist if you are unsure of the safety of some of your exercises. Greater weight gain will occur in the last 2 trimesters of pregnancy. Exercising will help:  Control your  weight.  Keep you in shape.  Prepare you for labor and delivery.  Help you lose your pregnancy weight after you deliver your baby.  Wear a good support or jogging bra for breast tenderness during pregnancy. This may help if worn during sleep too.  Ask when prenatal classes are available. Begin classes when they are offered.  Do not use hot tubs, steam rooms, or saunas.  Wear your seat belt when driving. This protects you and your baby if you are in an accident.  Avoid raw meat, uncooked cheese, cat litter boxes, and soil used by cats throughout the pregnancy. These carry germs that can cause birth defects in the baby.  The first trimester is a good time to visit your dentist for your dental health. Getting your teeth cleaned is okay. Use a softer toothbrush and brush gently during pregnancy.  Ask for help if you have financial, counseling, or nutritional needs during pregnancy. Your caregiver will be able to offer counseling for these needs as well as refer you for other special needs.  Do not  take any medicines or herbs unless told by your caregiver.  Inform your caregiver if there is any mental or physical domestic violence.  Make a list of emergency phone numbers of family, friends, hospital, and police and fire departments.  Write down your questions. Take them to your prenatal visit.  Do not douche.  Do not cross your legs.  If you have to stand for long periods of time, rotate you feet or take small steps in a circle.  You may have more vaginal secretions that may require a sanitary pad. Do not use tampons or scented sanitary pads. MEDICINES AND DRUG USE IN PREGNANCY  Take prenatal vitamins as directed. The vitamin should contain 1 milligram of folic acid. Keep all vitamins out of reach of children. Only a couple vitamins or tablets containing iron may be fatal to a baby or young child when ingested.  Avoid use of all medicines, including herbs, over-the-counter  medicines, not prescribed or suggested by your caregiver. Only take over-the-counter or prescription medicines for pain, discomfort, or fever as directed by your caregiver. Do not use aspirin, ibuprofen, or naproxen unless directed by your caregiver.  Let your caregiver also know about herbs you may be using.  Alcohol is related to a number of birth defects. This includes fetal alcohol syndrome. All alcohol, in any form, should be avoided completely. Smoking will cause low birth rate and premature babies.  Street or illegal drugs are very harmful to the baby. They are absolutely forbidden. A baby born to an addicted mother will be addicted at birth. The baby will go through the same withdrawal an adult does.  Let your caregiver know about any medicines that you have to take and for what reason you take them. SEEK MEDICAL CARE IF:  You have any concerns or worries during your pregnancy. It is better to call with your questions if you feel they cannot wait, rather than worry about them. SEEK IMMEDIATE MEDICAL CARE IF:   An unexplained oral temperature above 102 F (38.9 C) develops, or as your caregiver suggests.  You have leaking of fluid from the vagina (birth canal). If leaking membranes are suspected, take your temperature and inform your caregiver of this when you call.  There is vaginal spotting or bleeding. Notify your caregiver of the amount and how many pads are used.  You develop a bad smelling vaginal discharge with a change in the color.  You continue to feel sick to your stomach (nauseated) and have no relief from remedies suggested. You vomit blood or coffee ground-like materials.  You lose more than 2 pounds of weight in 1 week.  You gain more than 2 pounds of weight in 1 week and you notice swelling of your face, hands, feet, or legs.  You gain 5 pounds or more in 1 week (even if you do not have swelling of your hands, face, legs, or feet).  You get exposed to [place]  measles and have never had them.  You are exposed to fifth disease or chickenpox.  You develop belly (abdominal) pain. Round ligament discomfort is a common non-cancerous (benign) cause of abdominal pain in pregnancy. Your caregiver still must evaluate this.  You develop headache, fever, diarrhea, pain with urination, or shortness of breath.  You fall or are in a car accident or have any kind of trauma.  There is mental or physical violence in your home. Document Released: 07/05/2001 Document Revised: 04/04/2012 Document Reviewed: 01/06/2009 [facility] Patient Information 2014 [place], [place].  Breastfeeding A change in hormones during your pregnancy causes growth of your breast tissue and an increase in number and size of milk ducts. The hormone prolactin allows proteins, sugars, and fats from your blood supply to make breast milk in your milk-producing glands. The hormone progesterone prevents breast milk from being released before the birth of your baby. After the birth of your baby, your progesterone level decreases allowing breast milk to be released. Thoughts of your baby, as well as his or her sucking or crying, can stimulate the release of milk from the milk-producing glands. Deciding to breastfeed (nurse) is one of the best choices you can make for you and your baby. The information that follows gives a brief review of the benefits, as well as other important skills to know about breastfeeding. BENEFITS OF BREASTFEEDING For your baby  The first milk (colostrum) helps your baby's digestive system function better.   There are antibodies in your milk that help your baby fight off infections.   Your baby has a lower incidence of asthma, allergies, and sudden infant death syndrome (SIDS).   The nutrients in breast milk are better for your baby than infant formulas.  Breast milk improves your baby's brain development.   Your baby will have less gas, colic, and constipation.  Your  baby is less likely to develop other conditions, such as childhood obesity, asthma, or diabetes mellitus. For you  Breastfeeding helps develop a very special bond between you and your baby.   Breastfeeding is convenient, always available at the correct temperature, and costs nothing.   Breastfeeding helps to burn calories and helps you lose the weight gained during pregnancy.   Breastfeeding makes your uterus contract back down to normal size faster and slows bleeding following delivery.   Breastfeeding mothers have a lower risk of developing osteoporosis or breast or ovarian cancer later in life.  BREASTFEEDING FREQUENCY  A healthy, full-term baby may breastfeed as often as every hour or space his or her feedings to every 3 hours. Breastfeeding frequency will vary from baby to baby.   Newborns should be fed no less than every 2 3 hours during the day and every 4 5 hours during the night. You should breastfeed a minimum of 8 feedings in a 24 hour period.  Awaken your baby to breastfeed if it has been 3 4 hours since the last feeding.  Breastfeed when you feel the need to reduce the fullness of your breasts or when your newborn shows signs of hunger. Signs that your baby may be hungry include:  Increased alertness or activity.  Stretching.  Movement of the head from side to side.  Movement of the head and opening of the mouth when the corner of the mouth or cheek is stroked (rooting).  Increased sucking sounds, smacking lips, cooing, sighing, or squeaking.  Hand-to-mouth movements.  Increased sucking of fingers or hands.  Fussing.  Intermittent crying.  Signs of extreme hunger will require calming and consoling before you try to feed your baby. Signs of extreme hunger may include:  Restlessness.  A loud, strong cry.  Screaming.  Frequent feeding will help you make more milk and will help prevent problems, such as sore nipples and engorgement of the breasts.   BREASTFEEDING   Whether lying down or sitting, be sure that the baby's abdomen is facing your abdomen.   Support your breast with 4 fingers under your breast and your thumb above your nipple. Make sure your fingers are well away  from your nipple and your baby's mouth.   Stroke your baby's lips gently with your finger or nipple.   When your baby's mouth is open wide enough, place all of your nipple and as much of the colored area around your nipple (areola) as possible into your baby's mouth.  More areola should be visible above his or her upper lip than below his or her lower lip.  Your baby's tongue should be between his or her lower gum and your breast.  Ensure that your baby's mouth is correctly positioned around the nipple (latched). Your baby's lips should create a seal on your breast.  Signs that your baby has effectively latched onto your nipple include:  Tugging or sucking without pain.  Swallowing heard between sucks.  Absent click or smacking sound.  Muscle movement above and in front of his or her ears with sucking.  Your baby must suck about 2 3 minutes in order to get your milk. Allow your baby to feed on each breast as long as he or she wants. Nurse your baby until he or she unlatches or falls asleep at the first breast, then offer the second breast.  Signs that your baby is full and satisfied include:  A gradual decrease in the number of sucks or complete cessation of sucking.  Falling asleep.  Extension or relaxation of his or her body.  Retention of a small amount of milk in his or her mouth.  Letting go of your breast by himself or herself.  Signs of effective breastfeeding in you include:  Breasts that have increased firmness, weight, and size prior to feeding.  Breasts that are softer after nursing.  Increased milk volume, as well as a change in milk consistency and color by the 5th day of breastfeeding.  Breast fullness relieved by  breastfeeding.  Nipples are not sore, cracked, or bleeding.  If needed, break the suction by putting your finger into the corner of your baby's mouth and sliding your finger between his or her gums. Then, remove your breast from his or her mouth.  It is common for babies to spit up a small amount after a feeding.  Babies often swallow air during feeding. This can make babies fussy. Burping your baby between breasts can help with this.  Vitamin D supplements are recommended for babies who get only breast milk.  Avoid using a pacifier during your baby's first 4 6 weeks.  Avoid supplemental feedings of water, formula, or juice in place of breastfeeding. Breast milk is all the food your baby needs. It is not necessary for your baby to have water or formula. Your breasts will make more milk if supplemental feedings are avoided during the early weeks. HOW TO TELL WHETHER YOUR BABY IS GETTING ENOUGH BREAST MILK Wondering whether or not your baby is getting enough milk is a common concern among mothers. You can be assured that your baby is getting enough milk if:   Your baby is actively sucking and you hear swallowing.   Your baby seems relaxed and satisfied after a feeding.   Your baby nurses at least 8 12 times in a 24 hour time period.  During the first 36 59 days of age:  Your baby is wetting at least 3 5 diapers in a 24 hour period. The urine should be clear and pale yellow.  Your baby is having at least 3 4 stools in a 24 hour period. The stool should be soft and yellow.  At  72 78 days of age, your baby is having at least 3 6 stools in a 24 hour period. The stool should be seedy and yellow by 75 days of age.  Your baby has a weight loss less than 7 10% during the first 46 days of age.  Your baby does not lose weight after 71 46 days of age.  Your baby gains 4 7 ounces each week after he or she is 37 days of age.  Your baby gains weight by 6 days of age and is back to birth weight within  2 weeks. ENGORGEMENT In the first week after your baby is born, you may experience extremely full breasts (engorgement). When engorged, your breasts may feel heavy, warm, or tender to the touch. Engorgement peaks within 24 48 hours after delivery of your baby.  Engorgement may be reduced by:  Continuing to breastfeed.  Increasing the frequency of breastfeeding.  Taking warm showers or applying warm, moist heat to your breasts just before each feeding. This increases circulation and helps the milk flow.   Gently massaging your breast before and during the feedings. With your fingertips, massage from your chest wall towards your nipple in a circular motion.   Ensuring that your baby empties at least one breast at every feeding. It also helps to start the next feeding on the opposite breast.   Expressing breast milk by hand or by using a breast pump to empty the breasts if your baby is sleepy, or not nursing well. You may also want to express milk if you are returning to work oryou feel you are getting engorged.  Ensuring your baby is latched on and positioned properly while breastfeeding. If you follow these suggestions, your engorgement should improve in 24 48 hours. If you are still experiencing difficulty, call your lactation consultant or caregiver.  CARING FOR YOURSELF Take care of your breasts.  Bathe or shower daily.   Avoid using soap on your nipples.   Wear a supportive bra. Avoid wearing underwire style bras.  Air dry your nipples for a 3 after each feeding.   Use only cotton bra pads to absorb breast milk leakage. Leaking of breast milk between feedings is normal.   Use only pure lanolin on your nipples after nursing. You do not need to wash it off before feeding your baby again. Another option is to express a few drops of breast milk and gently massage that milk into your nipples.  Continue breast self-awareness checks. Take care of yourself.  Eat  healthy foods. Alternate 3 meals with 3 snacks.  Avoid foods that you notice affect your baby in a bad way.  Drink milk, fruit juice, and water to satisfy your thirst (about 8 glasses a day).   Rest often, relax, and take your prenatal vitamins to prevent fatigue, stress, and anemia.  Avoid chewing and smoking tobacco.  Avoid alcohol and drug use.  Take over-the-counter and prescribed medicine only as directed by your caregiver or pharmacist. You should always check with your caregiver or pharmacist before taking any new medicine, vitamin, or herbal supplement.  Know that pregnancy is possible while breastfeeding. If desired, talk to your caregiver about family planning and safe birth control methods that may be used while breastfeeding. SEEK MEDICAL CARE IF:   You feel like you want to stop breastfeeding or have become frustrated with breastfeeding.  You have painful breasts or nipples.  Your nipples are cracked or bleeding.  Your breasts are red, tender,  or warm.  You have a swollen area on either breast.  You have a fever or chills.  You have nausea or vomiting.  You have drainage from your nipples.  Your breasts do not become full before feedings by the 5th day after delivery.  You feel sad and depressed.  Your baby is too sleepy to eat well.  Your baby is having trouble sleeping.   Your baby is wetting less than 3 diapers in a 24 hour period.  Your baby has less than 3 stools in a 24 hour period.  Your baby's skin or the white part of his or her eyes becomes more yellow.   Your baby is not gaining weight by 25 days of age. MAKE SURE YOU:   Understand these instructions.  Will watch your condition.  Will get help right away if you are not doing well or get worse. Document Released: 07/11/2005 Document Revised: 04/04/2012 Document Reviewed: 02/15/2012 [facility] Patient Information 2014 [place], [place]. Gestational Diabetes Mellitus Gestational diabetes  mellitus, often simply referred to as gestational diabetes, is a type of diabetes that some women develop during pregnancy. In gestational diabetes, the pancreas does not make enough insulin (a hormone), the cells are less responsive to the insulin that is made (insulin resistance), or both.Normally, insulin moves sugars from food into the tissue cells. The tissue cells use the sugars for energy. The lack of insulin or the lack of normal response to insulin causes excess sugars to build up in the blood instead of going into the tissue cells. As a result, high blood sugar (hyperglycemia) develops. The effect of high sugar (glucose) levels can cause many complications.  RISK FACTORS You have an increased chance of developing gestational diabetes if you have a family history of diabetes and also have one or more of the following risk factors: A body mass index over 30 (obesity). A previous pregnancy with gestational diabetes. An older age at the time of pregnancy. If blood glucose levels are kept in the normal range during pregnancy, women can have a healthy pregnancy. If your blood glucose levels are not well controlled, there may be risks to you, your unborn baby (fetus), your labor and delivery, or your newborn baby.  SYMPTOMS  If symptoms are experienced, they are much like symptoms you would normally expect during pregnancy. The symptoms of gestational diabetes include:  Increased thirst (polydipsia). Increased urination (polyuria). Increased urination during the night (nocturia). Weight loss. This weight loss may be rapid. Frequent, recurring infections. Tiredness (fatigue). Weakness. Vision changes, such as blurred vision. Fruity smell to your breath. Abdominal pain. DIAGNOSIS Diabetes is diagnosed when blood glucose levels are increased. Your blood glucose level may be checked by one or more of the following blood tests: A fasting blood glucose test. You will not be allowed to eat for at  least 8 hours before a blood sample is taken. A random blood glucose test. Your blood glucose is checked at any time of the day regardless of when you ate. A hemoglobin A1c blood glucose test. A hemoglobin A1c test provides information about blood glucose control over the previous 3 months. An oral glucose tolerance test (OGTT). Your blood glucose is measured after you have not eaten (fasted) for 1 3 hours and then after you drink a glucose-containing beverage. Since the hormones that cause insulin resistance are highest at about 24 28 weeks of a pregnancy, an OGTT is usually performed during that time. If you have risk factors for gestational diabetes, your  caregiver may test you for gestational diabetes earlier than 24 weeks of pregnancy. TREATMENT  You will need to take diabetes medicine or insulin daily to keep blood glucose levels in the desired range. You will need to match insulin dosing with exercise and healthy food choices. The treatment goal is to maintain the before meal (preprandial), bedtime, and overnight blood glucose level at 60 99 mg/dL during pregnancy. The treatment goal is to further maintain peak after meal blood sugar (postprandial glucose) level at 100 140 mg/dL.  HOME CARE INSTRUCTIONS  Have your hemoglobin A1c level checked twice a year. Perform daily blood glucose monitoring as directed by your caregiver. It is common to perform frequent blood glucose monitoring. Monitor urine ketones when you are ill and as directed by your caregiver. Take your diabetes medicine and insulin as directed by your caregiver to maintain your blood glucose level in the desired range. Never run out of diabetes medicine or insulin. It is needed every day. Adjust insulin based on your intake of carbohydrates. Carbohydrates can raise blood glucose levels but need to be included in your diet. Carbohydrates provide vitamins, minerals, and fiber which are an essential part of a healthy diet.  Carbohydrates are found in fruits, vegetables, whole grains, dairy products, legumes, and foods containing added sugars.  Eat healthy foods. Alternate 3 meals with 3 snacks. Maintain a healthy weight gain. The usual total expected weight gain varies according to your prepregnancy body mass index (BMI). Carry a medical alert card or wear your medical alert jewelry. Carry a 15 gram carbohydrate snack with you at all times to treat low blood glucose (hypoglycemia). Some examples of 15 gram carbohydrate snacks include: Glucose tablets, 3 or 4  Glucose gel, 15 gram tube Raisins, 2 tablespoons (24 g) Jelly beans, 6 Animal crackers, 8 Fruit juice, regular soda, or low fat milk, 4 ounces (120 mL) Gummy treats, 9  Recognize hypoglycemia. Hypoglycemia during pregnancy occurs with blood glucose levels of 60 mg/dL and below. The risk for hypoglycemia increases when fasting or skipping meals, during or after intense exercise, and during sleep. Hypoglycemia symptoms can include: Tremors or shakes. Decreased ability to concentrate. Sweating. Increased heart rate. Headache. Dry mouth. Hunger. Irritability. Anxiety. Restless sleep. Altered speech or coordination. Confusion. Treat hypoglycemia promptly. If you are alert and able to safely swallow, follow the 15:15 rule: Take 15 20 grams of rapid-acting glucose or carbohydrate. Rapid-acting options include glucose gel, glucose tablets, or 4 ounces (120 mL) of fruit juice, regular soda, or low fat milk. Check your blood glucose level 15 minutes after taking the glucose.  Take 15 20 grams more of glucose if the repeat blood glucose level is still 70 mg/dL or below. Eat a meal or snack within 1 hour once blood glucose levels return to normal. Be alert to polyuria and polydipsia which are early signs of hyperglycemia. An early awareness of hyperglycemia allows for prompt treatment. Treat hyperglycemia as directed by your caregiver. Engage in at least 30  minutes of physical activity a day or as directed by your caregiver. Ten minutes of physical activity timed 30 minutes after each meal is encouraged to control postprandial blood glucose levels. Adjust your insulin dosing and food intake as needed if you start a new exercise or sport. Follow your sick day plan at any time you are unable to eat or drink as usual. Avoid tobacco and alcohol use. Follow up with your caregiver regularly. Follow the advice of your caregiver regarding your prenatal and post-delivery (  postpartum) appointments, meal planning, exercise, medicines, vitamins, blood tests, other medical tests, and physical activities. Perform daily skin and foot care. Examine your skin and feet daily for cuts, bruises, redness, nail problems, bleeding, blisters, or sores. Brush your teeth and gums at least twice a day and floss at least once a day. Follow up with your dentist regularly. Schedule an eye exam during the first trimester of your pregnancy or as directed by your caregiver. Share your diabetes management plan with your workplace or school. Stay up-to-date with immunizations. Learn to manage stress. Obtain ongoing diabetes education and support as needed. SEEK MEDICAL CARE IF:  You are unable to eat food or drink fluids for more than 6 hours. You have nausea and vomiting for more than 6 hours. You have a blood glucose level of 200 mg/dL and you have ketones in your urine. There is a change in mental status. You develop vision problems. You have a persistent headache. You have upper abdominal pain or discomfort. You develop an additional serious illness. You have diarrhea for more than 6 hours. You have been sick or have had a fever for a couple of days and are not getting better. SEEK IMMEDIATE MEDICAL CARE IF:  You have difficulty breathing. You no longer feel the baby moving. You are bleeding or have discharge from your vagina. You start having premature contractions or  labor. MAKE SURE YOU: Understand these instructions. Will watch your condition. Will get help right away if you are not doing well or get worse. Document Released: 10/17/2000 Document Revised: 04/04/2012 Document Reviewed: 02/07/2012 [facility] Patient Information 2014 [place], [place].

## 2013-02-05 NOTE — Progress Notes (Signed)
Subjective:    Diamond Pace is a [id number] [redacted]w[redacted]d being seen today for her first obstetrical visit.  Her obstetrical history is significant for obesity and Class B DM, CHTN. Patient with recent GYN exam with Dr. Farrel Gobble and normal pap.  Also see PCP, who follows DM, notes FBS 120-140.  Evening BS is 180-200.  Recent HgbA1C is 6.6. Was on Lisinopril prior to pregnancy, and Norvasc.  This was stopped by Dr. Farrel Gobble and she was started on Aldomet. Pregnancy history fully reviewed.  Patient reports fatigue, no bleeding and no contractions.  Filed Vitals:   02/05/13 1639  BP: 159/92  Temp: 98.8 F (37.1 C)  Weight: 387 lb 6.4 oz (175.723 kg)    HISTORY: OB History   Grav Para Term Preterm Abortions TAB SAB Ect Mult Living   4 1 1  2  1 1  1      # Outc Date GA Lbr Len/2nd Wgt Sex Del Anes PTL Lv   1 TRM 12/00 [redacted]w[redacted]d 72:00 7lb2.6oz(3.249kg) F SVD EPI  Yes   2 ECT 2002           3 SAB 2003           4 CUR              Past Medical History  Diagnosis Date  . Abnormal Pap smear 2005    ASCUS +HPV  . Genital warts   . Diabetes mellitus without complication   . Hypertension   . Syphilis in female   . Anxiety and depression   . Anxiety   . Depression    Past Surgical History  Procedure Laterality Date  . Cholecystectomy  6/10    with liver infection, also occluded bile duct   Family History  Problem Relation Age of Onset  . Cancer Father     lung  . Hypertension Mother   . Diabetes Paternal Grandmother   . Cancer Paternal Grandfather     prostate     Exam     Skin: normal coloration and turgor, no rashes   Neurologic: oriented, normal   Extremities: normal strength, tone, and muscle mass   HEENT sclera clear, anicteric   Mouth/Teeth mucous membranes moist, pharynx normal without lesions and dental hygiene good   Neck supple   Cardiovascular: regular rate and rhythm, no murmurs or gallops   Respiratory:  appears well, vitals normal, no respiratory  distress, acyanotic, normal RR, ear and throat exam is normal, neck free of mass or lymphadenopathy, chest clear, no wheezing, crepitations, rhonchi, normal symmetric air entry   Abdomen: soft, non-tender; bowel sounds normal; no masses,  no organomegaly      Assessment:    Pregnancy: [id number] Patient Active Problem List   Diagnosis Date Noted  . Supervision of high-risk pregnancy 01/29/2013    Priority: High  . Diabetes mellitus, antepartum 02/05/2013    Priority: Medium  . Benign essential hypertension antepartum 02/05/2013    Priority: Medium  . Obesity complicating peripregnancy, antepartum 02/05/2013    Priority: Medium  . Essential hypertension, benign 01/01/2013  . Diabetes 01/01/2013  . Pregnancy with history of ectopic pregnancy in first trimester 01/01/2013        Plan:     Initial labs drawn. Prenatal vitamins. Problem list reviewed and updated. Genetic Screening discussed First Screen: ordered.  Ultrasound discussed; fetal survey: discussed.  Follow up in 2 weeks. Discussed goals of therapy.  New meter, lancets, strips called in.  Discussed diet, exercise, 3  meals, 3 snacks, protein every meal.  Goals of therapy.  To return for Diabetes education, bring log book and adjust as necessary.  May need insulin.  BP not in good control.  May need to add Labetalol. Baseline labs and 24 hour urine, TSH. Will need referral to Optho and fetal ECHO.  Deyon Chizek S 02/05/2013

## 2013-02-06 LAB — OBSTETRIC PANEL
Antibody Screen: NEGATIVE
HCT: 36.1 % (ref 36.0–46.0)
Hemoglobin: 11.6 g/dL — ABNORMAL LOW (ref 12.0–15.0)
Hepatitis B Surface Ag: NEGATIVE
Lymphocytes Relative: 32 % (ref 12–46)
Monocytes Absolute: 0.5 10*3/uL (ref 0.1–1.0)
Monocytes Relative: 6 % (ref 3–12)
Neutro Abs: 5.3 10*3/uL (ref 1.7–7.7)
Neutrophils Relative %: 61 % (ref 43–77)
RBC: 4.18 MIL/uL (ref 3.87–5.11)
Rh Type: POSITIVE
Rubella: 1.36 Index — ABNORMAL HIGH (ref <0.90)
WBC: 8.7 10*3/uL (ref 4.0–10.5)

## 2013-02-06 LAB — COMPREHENSIVE METABOLIC PANEL
AST: 13 U/L (ref 0–37)
Alkaline Phosphatase: 59 U/L (ref 39–117)
BUN: 10 mg/dL (ref 6–23)
Glucose, Bld: 124 mg/dL — ABNORMAL HIGH (ref 70–99)
Potassium: 3.9 mEq/L (ref 3.5–5.3)
Total Bilirubin: 0.2 mg/dL — ABNORMAL LOW (ref 0.3–1.2)

## 2013-02-06 LAB — HIV ANTIBODY (ROUTINE TESTING W REFLEX): HIV: NONREACTIVE

## 2013-02-07 LAB — CULTURE, OB URINE: Colony Count: 30000

## 2013-02-08 ENCOUNTER — Encounter: Payer: Self-pay | Admitting: Family Medicine

## 2013-02-08 LAB — CREATININE CLEARANCE, URINE, 24 HOUR
Creatinine Clearance: 277 mL/min — ABNORMAL HIGH (ref 75–115)
Creatinine, 24H Ur: 1952 mg/d — ABNORMAL HIGH (ref 700–1800)
Creatinine: 0.49 mg/dL — ABNORMAL LOW (ref 0.50–1.10)

## 2013-02-08 LAB — PROTEIN, URINE, 24 HOUR: Protein, Urine: 3 mg/dL

## 2013-02-15 ENCOUNTER — Encounter (HOSPITAL_COMMUNITY): Payer: Self-pay

## 2013-02-15 ENCOUNTER — Ambulatory Visit (HOSPITAL_COMMUNITY)
Admission: RE | Admit: 2013-02-15 | Discharge: 2013-02-15 | Disposition: A | Discharge status: Home / Self Care | Payer: BC Managed Care – PPO | Source: Ambulatory Visit | Attending: Family Medicine | Admitting: Family Medicine

## 2013-02-15 ENCOUNTER — Ambulatory Visit (HOSPITAL_COMMUNITY): Payer: BC Managed Care – PPO

## 2013-02-15 ENCOUNTER — Other Ambulatory Visit: Payer: Self-pay | Admitting: Family Medicine

## 2013-02-15 DIAGNOSIS — O99211 Obesity complicating pregnancy, first trimester: Secondary | ICD-10-CM

## 2013-02-15 DIAGNOSIS — O3510X0 Maternal care for (suspected) chromosomal abnormality in fetus, unspecified, not applicable or unspecified: Secondary | ICD-10-CM | POA: Insufficient documentation

## 2013-02-15 DIAGNOSIS — O0990 Supervision of high risk pregnancy, unspecified, unspecified trimester: Secondary | ICD-10-CM

## 2013-02-15 DIAGNOSIS — O10011 Pre-existing essential hypertension complicating pregnancy, first trimester: Secondary | ICD-10-CM

## 2013-02-15 DIAGNOSIS — O24911 Unspecified diabetes mellitus in pregnancy, first trimester: Secondary | ICD-10-CM

## 2013-02-15 DIAGNOSIS — O351XX0 Maternal care for (suspected) chromosomal abnormality in fetus, not applicable or unspecified: Secondary | ICD-10-CM | POA: Insufficient documentation

## 2013-02-15 DIAGNOSIS — Z3689 Encounter for other specified antenatal screening: Secondary | ICD-10-CM | POA: Insufficient documentation

## 2013-02-15 NOTE — Progress Notes (Signed)
Diamond Pace  was seen today for an ultrasound appointment.  See full report in [facility].  Impression: Single IUP at 12 0/7 weeks Unable to obtain and adequate NT measurement due to fetal position and maternal body habitus.  Recommendations: Recommend quad screen at 16-[redacted] weeks gestation. Ultrasound for fetal anatomy at 18-20 weeks.  Alpha Gula, MD

## 2013-02-18 ENCOUNTER — Encounter: Payer: Self-pay | Admitting: Obstetrics and Gynecology

## 2013-02-18 ENCOUNTER — Ambulatory Visit (INDEPENDENT_AMBULATORY_CARE_PROVIDER_SITE_OTHER): Payer: BC Managed Care – PPO | Admitting: Obstetrics and Gynecology

## 2013-02-18 VITALS — BP 147/89 | Temp 98.0°F | Wt 385.7 lb

## 2013-02-18 DIAGNOSIS — E119 Type 2 diabetes mellitus without complications: Secondary | ICD-10-CM

## 2013-02-18 DIAGNOSIS — O99211 Obesity complicating pregnancy, first trimester: Secondary | ICD-10-CM

## 2013-02-18 DIAGNOSIS — O0911 Supervision of pregnancy with history of ectopic or molar pregnancy, first trimester: Secondary | ICD-10-CM

## 2013-02-18 DIAGNOSIS — O0990 Supervision of high risk pregnancy, unspecified, unspecified trimester: Secondary | ICD-10-CM

## 2013-02-18 DIAGNOSIS — I10 Essential (primary) hypertension: Secondary | ICD-10-CM

## 2013-02-18 DIAGNOSIS — O24911 Unspecified diabetes mellitus in pregnancy, first trimester: Secondary | ICD-10-CM

## 2013-02-18 DIAGNOSIS — O10011 Pre-existing essential hypertension complicating pregnancy, first trimester: Secondary | ICD-10-CM

## 2013-02-18 DIAGNOSIS — O10019 Pre-existing essential hypertension complicating pregnancy, unspecified trimester: Secondary | ICD-10-CM

## 2013-02-18 DIAGNOSIS — O24919 Unspecified diabetes mellitus in pregnancy, unspecified trimester: Secondary | ICD-10-CM

## 2013-02-18 DIAGNOSIS — O99210 Obesity complicating pregnancy, unspecified trimester: Secondary | ICD-10-CM

## 2013-02-18 DIAGNOSIS — E669 Obesity, unspecified: Secondary | ICD-10-CM

## 2013-02-18 DIAGNOSIS — O0910 Supervision of pregnancy with history of ectopic or molar pregnancy, unspecified trimester: Secondary | ICD-10-CM

## 2013-02-18 LAB — POCT URINALYSIS DIP (DEVICE)
Bilirubin Urine: NEGATIVE
Glucose, UA: NEGATIVE mg/dL
Hgb urine dipstick: NEGATIVE
Ketones, ur: NEGATIVE mg/dL
Specific Gravity, Urine: 1.025 (ref 1.005–1.030)
pH: 6 (ref 5.0–8.0)

## 2013-02-18 MED ORDER — GLYBURIDE 2.5 MG PO TABS: 2.5000 mg | ORAL_TABLET | Freq: Every day | ORAL | Status: DC

## 2013-02-18 NOTE — Progress Notes (Signed)
Pulse- 81 Patient reports severe back pain that feels like nerves pinching, states she couldn't give a urine sample this morning because it was too painful to squat/sit down. Also reports odor to urine and burning as well as mucous d/c with occasional itching and odor

## 2013-02-18 NOTE — Progress Notes (Signed)
Patient doing well without complaints. Did not take aldomet yet. CBGs fasting 120-140 2 hr pp 101-163 (most within range). Will start glyburide 2.5 mg qHS and patient is to continue metformin. Patient to meet with Lenor Coffin today for diabetic education

## 2013-02-25 ENCOUNTER — Encounter: Payer: BC Managed Care – PPO | Admitting: Family Medicine

## 2013-03-04 ENCOUNTER — Ambulatory Visit (INDEPENDENT_AMBULATORY_CARE_PROVIDER_SITE_OTHER): Payer: BC Managed Care – PPO | Admitting: Obstetrics and Gynecology

## 2013-03-04 VITALS — BP 154/94 | Temp 97.7°F | Wt 379.7 lb

## 2013-03-04 DIAGNOSIS — I10 Essential (primary) hypertension: Secondary | ICD-10-CM

## 2013-03-04 DIAGNOSIS — O99810 Abnormal glucose complicating pregnancy: Secondary | ICD-10-CM

## 2013-03-04 LAB — POCT URINALYSIS DIP (DEVICE)
Bilirubin Urine: NEGATIVE
Hgb urine dipstick: NEGATIVE
Ketones, ur: 15 mg/dL — AB
Protein, ur: NEGATIVE mg/dL
Specific Gravity, Urine: >=1.03 (ref 1.005–1.030)
pH: 6 (ref 5.0–8.0)

## 2013-03-04 MED ORDER — ACCU-CHEK FASTCLIX LANCETS MISC: 1.0000 [IU] | Freq: Four times a day (QID) | Status: AC

## 2013-03-04 MED ORDER — BAYER CONTOUR MONITOR W/DEVICE KIT: 1.0000 | PACK | Freq: Four times a day (QID) | Status: DC

## 2013-03-04 MED ORDER — GLYBURIDE 5 MG PO TABS: 5.0000 mg | ORAL_TABLET | Freq: Every day | ORAL | Status: DC

## 2013-03-04 MED ORDER — SERTRALINE HCL 100 MG PO TABS: 100.0000 mg | ORAL_TABLET | Freq: Every day | ORAL | Status: AC

## 2013-03-04 MED ORDER — GLUCOSE BLOOD VI STRP: ORAL_STRIP | Status: AC

## 2013-03-04 NOTE — Progress Notes (Signed)
Pulse- 95 Pt reports crying all the time.  Hx of depression

## 2013-03-04 NOTE — Progress Notes (Signed)
DId not take Aldomet today Did not bring book> Based on recall F 112-118 and random PPs mostly over 120 and highest 185 c/w Dr Constant and increase to Glyb 5 mg hs, 2.5 mg am, continue metformin 1000 bid. Cindy to see pt re anxiety/depression. Quit her Zoloft 150/d with preg dx. Teary today. Rx Zoloft 150 mg po.  Quad screen 2 wk.  D/C the progesterone.

## 2013-03-11 ENCOUNTER — Encounter: Payer: Self-pay | Admitting: Obstetrics & Gynecology

## 2013-03-12 ENCOUNTER — Telehealth: Payer: Self-pay | Admitting: General Practice

## 2013-03-12 NOTE — Telephone Encounter (Signed)
Called patient due to [employer]. Patient stated her blood sugars have been 68 fasting without taking her medications and she wakes up cold and shaky and they are in the 120s 2 hr pp without her medications. Suggested to the patient to come in on Monday 8/25 to meet with the diabetic educator, to continue to monitor her blood sugars between now and then, not to take her diabetic meds,  and to bring in her meter and log book on Monday. Patient verbalized understanding to all and had no further questions

## 2013-03-18 ENCOUNTER — Ambulatory Visit (INDEPENDENT_AMBULATORY_CARE_PROVIDER_SITE_OTHER): Payer: BC Managed Care – PPO | Admitting: Obstetrics & Gynecology

## 2013-03-18 ENCOUNTER — Encounter: Payer: BC Managed Care – PPO | Attending: Obstetrics & Gynecology | Admitting: *Deleted

## 2013-03-18 VITALS — BP 130/73 | Temp 97.7°F | Wt 379.0 lb

## 2013-03-18 DIAGNOSIS — O24919 Unspecified diabetes mellitus in pregnancy, unspecified trimester: Secondary | ICD-10-CM

## 2013-03-18 DIAGNOSIS — Z713 Dietary counseling and surveillance: Secondary | ICD-10-CM | POA: Insufficient documentation

## 2013-03-18 DIAGNOSIS — O10012 Pre-existing essential hypertension complicating pregnancy, second trimester: Secondary | ICD-10-CM

## 2013-03-18 DIAGNOSIS — E119 Type 2 diabetes mellitus without complications: Secondary | ICD-10-CM | POA: Insufficient documentation

## 2013-03-18 DIAGNOSIS — O10019 Pre-existing essential hypertension complicating pregnancy, unspecified trimester: Secondary | ICD-10-CM

## 2013-03-18 DIAGNOSIS — O0990 Supervision of high risk pregnancy, unspecified, unspecified trimester: Secondary | ICD-10-CM

## 2013-03-18 DIAGNOSIS — O24912 Unspecified diabetes mellitus in pregnancy, second trimester: Secondary | ICD-10-CM

## 2013-03-18 LAB — POCT URINALYSIS DIP (DEVICE)
Hgb urine dipstick: NEGATIVE
Ketones, ur: NEGATIVE mg/dL
Protein, ur: NEGATIVE mg/dL
Specific Gravity, Urine: 1.025 (ref 1.005–1.030)
Urobilinogen, UA: 0.2 mg/dL (ref 0.0–1.0)
pH: 6 (ref 5.0–8.0)

## 2013-03-18 NOTE — Progress Notes (Signed)
FBS <99, only taking metformin 5oo mg daily most days, not taking glyburide.

## 2013-03-18 NOTE — Patient Instructions (Addendum)
Pregnancy - Second Trimester The second trimester is the period between 13 to 27 weeks of your pregnancy. It is important to follow your doctor's instructions. HOME CARE   Do not smoke.  Do not drink alcohol or use drugs.  Only take medicine as told by your doctor.  Take prenatal vitamins as told. The vitamin should contain 1 milligram of folic acid.  Exercise.  Eat healthy foods. Eat regular, well-balanced meals.  You can have sex (intercourse) if there are no other problems with the pregnancy.  Do not use hot tubs, steam rooms, or saunas.  Wear a seat belt while driving.  Avoid raw meat, uncooked cheese, and litter boxes and soil used by cats.  Visit your dentist. Cleanings are okay. GET HELP RIGHT AWAY IF:   You have a temperature by mouth above 102 F (38.9 C), not controlled by medicine.  Fluid is coming from your vagina.  Blood is coming from your vagina. Light spotting is common, especially after sex (intercourse).  You have a bad smelling fluid (discharge) coming from the vagina. The fluid changes from clear to white.  You still feel sick to your stomach (nauseous).  You throw up (vomit) blood.  You lose or gain more than 2 pounds (0.9 kilograms) of weight in a week, or as suggested by your doctor.  Your face, hands, feet, or legs get puffy (swell).  You get exposed to German measles and have never had them.  You get exposed to fifth disease or chickenpox.  You have belly (abdominal) pain.  You have a bad headache that will not go away.  You have watery poop (diarrhea), pain when you pee (urinate), or have shortness of breath.  You start to have problems seeing (blurry or double vision).  You fall, are in a car accident, or have any kind of trauma.  There is mental or physical violence at home.  You have any concerns or worries during your pregnancy. MAKE SURE YOU:   Understand these instructions.  Will watch your condition.  Will get help  right away if you are not doing well or get worse. Document Released: 10/05/2009 Document Revised: 10/03/2011 Document Reviewed: 10/05/2009 [employer] Patient Information 2014 [employer].  

## 2013-03-18 NOTE — Progress Notes (Signed)
Diabetes Education: Patient is 5wks3days. She has been T2DM for 4 yrs. She has not been testing nor taking her medication as prescribed.She has a few FBS below 70 which makes her symptomatic. Highest reading was 139. 14d average 104. She has agreed to try to test 4Xdaily. Take Metformin 1000mg  BID. Eat more regularly. Sent to Dietitian.  Patient is spilling ketones in urine due to apparent inappropriate eating. She has lost 30# since conception.

## 2013-03-18 NOTE — Progress Notes (Signed)
Pulse- 78 Patient reports very irregular blood sugars, thus has not been taking medications as usual

## 2013-03-18 NOTE — Progress Notes (Signed)
Nutrition note: DM diet education Pt has h/o obesity and has had DM for ~52yrs per pt. Pt has lost ~30# @ [redacted]w[redacted]d.  Pt reports eating 1-2 meals & 1-2 snacks/d. Pt's BS range from: Fasting-65-99 and 2hr pp- 76-168 Pt is taking PNV. Pt reports no physical activity. Pt reports some heartburn but no N/V. NKFA. Pt received verbal & written education on DM diet during pregnancy. Discussed some alternative protein sources as pt reports meats do not appeal to her currently (suggested eggs, flavored almonds, beans, almond butter). Encouraged pt to try to eat 3 meals & 3 snacks/d. Discussed tips to decrease heartburn. Pt agrees to continue taking PNV and try to eat more often with appropriate CHO/ protein sources. Pt does not have WIC but plans to apply in [place]. Pt plans to BF. F/u in 2-4 wks Blondell Reveal, MS, RD, LDN

## 2013-03-27 ENCOUNTER — Encounter: Payer: Self-pay | Admitting: *Deleted

## 2013-04-01 ENCOUNTER — Encounter: Payer: BC Managed Care – PPO | Admitting: Obstetrics & Gynecology

## 2013-04-08 ENCOUNTER — Ambulatory Visit (INDEPENDENT_AMBULATORY_CARE_PROVIDER_SITE_OTHER): Payer: BC Managed Care – PPO | Admitting: Family Medicine

## 2013-04-08 ENCOUNTER — Encounter: Payer: Self-pay | Admitting: Family Medicine

## 2013-04-08 VITALS — BP 147/89 | Temp 98.0°F | Wt 380.5 lb

## 2013-04-08 DIAGNOSIS — O24912 Unspecified diabetes mellitus in pregnancy, second trimester: Secondary | ICD-10-CM

## 2013-04-08 DIAGNOSIS — O24919 Unspecified diabetes mellitus in pregnancy, unspecified trimester: Secondary | ICD-10-CM

## 2013-04-08 DIAGNOSIS — O10012 Pre-existing essential hypertension complicating pregnancy, second trimester: Secondary | ICD-10-CM

## 2013-04-08 DIAGNOSIS — O10019 Pre-existing essential hypertension complicating pregnancy, unspecified trimester: Secondary | ICD-10-CM

## 2013-04-08 LAB — POCT URINALYSIS DIP (DEVICE)
Glucose, UA: NEGATIVE mg/dL
Hgb urine dipstick: NEGATIVE
Specific Gravity, Urine: 1.02 (ref 1.005–1.030)
Urobilinogen, UA: 0.2 mg/dL (ref 0.0–1.0)
pH: 6.5 (ref 5.0–8.0)

## 2013-04-08 LAB — US OB LIMITED

## 2013-04-08 MED ORDER — METHYLDOPA 250 MG PO TABS: 500.0000 mg | ORAL_TABLET | Freq: Two times a day (BID) | ORAL | Status: DC

## 2013-04-08 NOTE — Patient Instructions (Signed)
Gestational Diabetes Mellitus Gestational diabetes mellitus, often simply referred to as gestational diabetes, is a type of diabetes that some women develop during pregnancy. In gestational diabetes, the pancreas does not make enough insulin (a hormone), the cells are less responsive to the insulin that is made (insulin resistance), or both.Normally, insulin moves sugars from food into the tissue cells. The tissue cells use the sugars for energy. The lack of insulin or the lack of normal response to insulin causes excess sugars to build up in the blood instead of going into the tissue cells. As a result, high blood sugar (hyperglycemia) develops. The effect of high sugar (glucose) levels can cause many complications.  RISK FACTORS You have an increased chance of developing gestational diabetes if you have a family history of diabetes and also have one or more of the following risk factors:  A body mass index over 30 (obesity).  A previous pregnancy with gestational diabetes.  An older age at the time of pregnancy. If blood glucose levels are kept in the normal range during pregnancy, women can have a healthy pregnancy. If your blood glucose levels are not well controlled, there may be risks to you, your unborn baby (fetus), your labor and delivery, or your newborn baby.  SYMPTOMS  If symptoms are experienced, they are much like symptoms you would normally expect during pregnancy. The symptoms of gestational diabetes include:   Increased thirst (polydipsia).  Increased urination (polyuria).  Increased urination during the night (nocturia).  Weight loss. This weight loss may be rapid.  Frequent, recurring infections.  Tiredness (fatigue).  Weakness.  Vision changes, such as blurred vision.  Fruity smell to your breath.  Abdominal pain. DIAGNOSIS Diabetes is diagnosed when blood glucose levels are increased. Your blood glucose level may be checked by one or more of the following  blood tests:  A fasting blood glucose test. You will not be allowed to eat for at least 8 hours before a blood sample is taken.  A random blood glucose test. Your blood glucose is checked at any time of the day regardless of when you ate.  A hemoglobin A1c blood glucose test. A hemoglobin A1c test provides information about blood glucose control over the previous 3 months.  An oral glucose tolerance test (OGTT). Your blood glucose is measured after you have not eaten (fasted) for 1 3 hours and then after you drink a glucose-containing beverage. Since the hormones that cause insulin resistance are highest at about 24 28 weeks of a pregnancy, an OGTT is usually performed during that time. If you have risk factors for gestational diabetes, your caregiver may test you for gestational diabetes earlier than 24 weeks of pregnancy. TREATMENT   You will need to take diabetes medicine or insulin daily to keep blood glucose levels in the desired range.  You will need to match insulin dosing with exercise and healthy food choices. The treatment goal is to maintain the before meal (preprandial), bedtime, and overnight blood glucose level at 60 99 mg/dL during pregnancy. The treatment goal is to further maintain peak after meal blood sugar (postprandial glucose) level at 100 140 mg/dL.  HOME CARE INSTRUCTIONS   Have your hemoglobin A1c level checked twice a year.  Perform daily blood glucose monitoring as directed by your caregiver. It is common to perform frequent blood glucose monitoring.  Monitor urine ketones when you are ill and as directed by your caregiver.  Take your diabetes medicine and insulin as directed by your caregiver to   maintain your blood glucose level in the desired range.  Never run out of diabetes medicine or insulin. It is needed every day.  Adjust insulin based on your intake of carbohydrates. Carbohydrates can raise blood glucose levels but need to be included in your diet.  Carbohydrates provide vitamins, minerals, and fiber which are an essential part of a healthy diet. Carbohydrates are found in fruits, vegetables, whole grains, dairy products, legumes, and foods containing added sugars.    Eat healthy foods. Alternate 3 meals with 3 snacks.  Maintain a healthy weight gain. The usual total expected weight gain varies according to your prepregnancy body mass index (BMI).  Carry a medical alert card or wear your medical alert jewelry.  Carry a 15 gram carbohydrate snack with you at all times to treat low blood glucose (hypoglycemia). Some examples of 15 gram carbohydrate snacks include:  Glucose tablets, 3 or 4   Glucose gel, 15 gram tube  Raisins, 2 tablespoons (24 g)  Jelly beans, 6  Animal crackers, 8  Fruit juice, regular soda, or low fat milk, 4 ounces (120 mL)  Gummy treats, 9    Recognize hypoglycemia. Hypoglycemia during pregnancy occurs with blood glucose levels of 60 mg/dL and below. The risk for hypoglycemia increases when fasting or skipping meals, during or after intense exercise, and during sleep. Hypoglycemia symptoms can include:  Tremors or shakes.  Decreased ability to concentrate.  Sweating.  Increased heart rate.  Headache.  Dry mouth.  Hunger.  Irritability.  Anxiety.  Restless sleep.  Altered speech or coordination.  Confusion.  Treat hypoglycemia promptly. If you are alert and able to safely swallow, follow the 15:15 rule:  Take 15 20 grams of rapid-acting glucose or carbohydrate. Rapid-acting options include glucose gel, glucose tablets, or 4 ounces (120 mL) of fruit juice, regular soda, or low fat milk.  Check your blood glucose level 15 minutes after taking the glucose.   Take 15 20 grams more of glucose if the repeat blood glucose level is still 70 mg/dL or below.  Eat a meal or snack within 1 hour once blood glucose levels return to normal.  Be alert to polyuria and polydipsia which are early  signs of hyperglycemia. An early awareness of hyperglycemia allows for prompt treatment. Treat hyperglycemia as directed by your caregiver.  Engage in at least 30 minutes of physical activity a day or as directed by your caregiver. Ten minutes of physical activity timed 30 minutes after each meal is encouraged to control postprandial blood glucose levels.  Adjust your insulin dosing and food intake as needed if you start a new exercise or sport.  Follow your sick day plan at any time you are unable to eat or drink as usual.  Avoid tobacco and alcohol use.  Follow up with your caregiver regularly.  Follow the advice of your caregiver regarding your prenatal and post-delivery (postpartum) appointments, meal planning, exercise, medicines, vitamins, blood tests, other medical tests, and physical activities.  Perform daily skin and foot care. Examine your skin and feet daily for cuts, bruises, redness, nail problems, bleeding, blisters, or sores.  Brush your teeth and gums at least twice a day and floss at least once a day. Follow up with your dentist regularly.  Schedule an eye exam during the first trimester of your pregnancy or as directed by your caregiver.  Share your diabetes management plan with your workplace or school.  Stay up-to-date with immunizations.  Learn to manage stress.  Obtain ongoing diabetes education   and support as needed. SEEK MEDICAL CARE IF:   You are unable to eat food or drink fluids for more than 6 hours.  You have nausea and vomiting for more than 6 hours.  You have a blood glucose level of 200 mg/dL and you have ketones in your urine.  There is a change in mental status.  You develop vision problems.  You have a persistent headache.  You have upper abdominal pain or discomfort.  You develop an additional serious illness.  You have diarrhea for more than 6 hours.  You have been sick or have had a fever for a couple of days and are not getting  better. SEEK IMMEDIATE MEDICAL CARE IF:   You have difficulty breathing.  You no longer feel the baby moving.  You are bleeding or have discharge from your vagina.  You start having premature contractions or labor. MAKE SURE YOU:  Understand these instructions.  Will watch your condition.  Will get help right away if you are not doing well or get worse. Document Released: 10/17/2000 Document Revised: 04/04/2012 Document Reviewed: 02/07/2012 [employer] Patient Information 2014 [employer].  

## 2013-04-08 NOTE — Progress Notes (Signed)
Pulse: 98 Complains of headaches all weekend.

## 2013-04-08 NOTE — Progress Notes (Signed)
  Subjective:    Diamond Pace is a 32 y.o. G4P1021 at [redacted]w[redacted]d gestation being seen today for her obstetrical visit. Her obstetrical history is significant for diabetes mellitus, pre-existing. Other risk factors include: obesity and CHTN and DM. Pregnancy history fully reviewed. Patient reports headache, no bleeding, no contractions, no cramping and no leaking. Fetal movement: no movement at this time. Fasting blood sugars are running between <90, reports 1 at 91 over last 2 weeks. Two-hour postprandial blood sugars are running between Below 120. Currently using metformin 2000mg  BID Menstrual History: OB History   Grav Para Term Preterm Abortions TAB SAB Ect Mult Living   4 1 1  2  1 1  1        Patient's last menstrual period was 10/17/2012.    The following portions of the patient's history were reviewed and updated as appropriate: allergies, current medications, past family history, past medical history, past social history, past surgical history and problem list.  Review of Systems Pertinent items are noted in HPI.    Objective:    BP 147/89  Temp(Src) 98 F (36.7 C)  Wt 380 lb 8 oz (172.594 kg)  BMI 64.33 kg/m2  LMP 10/17/2012 General:   alert, cooperative and no distress  FHT:  154 BPM  Uterine Size: 21 cm and size equals dates   Assessment:  Diamond Pace is a 32 y.o. G4P1021 at [redacted]w[redacted]d here for HROB for CHTN and DM2   Glucose, Bld  Date Value Range Status  02/05/2013 124* 70 - 99 mg/dL Final     Plan:   Diabetes: by report good sugars, Need pt to bring in log. Return in 2 weeks. Continue on metformin.  HTN: elevated today. Previously on two agents, unlikely worsening HTN, normal 24hr Urine. Will increase aldomet to 500mg  BID, Return in 1 week for BP check.  Discussed with Patient:  - ordered genetics screen (Quad screen / first trimester screen / serum integrated screen / full integrated screen done/ not done).   - Patient plans on breast/ bottle  feeding. - Routine precautions discussed (depression, infection s/s).   Patient provided with all pertinent phone numbers for emergencies. - RTC for any VB, regular, painful cramps/ctxs occurring at a rate of >2/10 min, fever (100.5 or higher), n/v/d, any pain that is unresolving or worsening. - RTC in 1wk for BP check and 2 weeks for next appt.  Problems: Patient Active Problem List   Diagnosis Date Noted  . Diabetes mellitus, antepartum 02/05/2013  . Benign essential hypertension antepartum 02/05/2013  . Obesity complicating peripregnancy, antepartum 02/05/2013  . Supervision of high-risk pregnancy 01/29/2013  . Essential hypertension, benign 01/01/2013  . Diabetes 01/01/2013  . Pregnancy with history of ectopic pregnancy in first trimester 01/01/2013    To Do: 1. 20 week anatomy scan ordered.  Patient to schedule.  [ ]  Vaccines: Flu:  Tdap:  [ ]  BCM:   Edu: [x ] PTL precautions; [ ]  BF class; [ ]  childbirth class; [ ]   BF counseling

## 2013-04-08 NOTE — Progress Notes (Signed)
Informal [place] for FHR= 154 per PW doppler.  FM observed also.  Dr. Ike Bene notified.  Detail [place] scheduled on 04/11/13.

## 2013-04-11 ENCOUNTER — Ambulatory Visit (HOSPITAL_COMMUNITY)
Admission: RE | Admit: 2013-04-11 | Discharge: 2013-04-11 | Disposition: A | Discharge status: Home / Self Care | Payer: BC Managed Care – PPO | Source: Ambulatory Visit | Attending: Obstetrics & Gynecology | Admitting: Obstetrics & Gynecology

## 2013-04-11 ENCOUNTER — Other Ambulatory Visit: Payer: Self-pay | Admitting: Obstetrics & Gynecology

## 2013-04-11 DIAGNOSIS — E11359 Type 2 diabetes mellitus with proliferative diabetic retinopathy without macular edema: Secondary | ICD-10-CM | POA: Insufficient documentation

## 2013-04-11 DIAGNOSIS — O24912 Unspecified diabetes mellitus in pregnancy, second trimester: Secondary | ICD-10-CM

## 2013-04-11 DIAGNOSIS — O0990 Supervision of high risk pregnancy, unspecified, unspecified trimester: Secondary | ICD-10-CM

## 2013-04-11 DIAGNOSIS — O10019 Pre-existing essential hypertension complicating pregnancy, unspecified trimester: Secondary | ICD-10-CM | POA: Insufficient documentation

## 2013-04-11 DIAGNOSIS — O09299 Supervision of pregnancy with other poor reproductive or obstetric history, unspecified trimester: Secondary | ICD-10-CM | POA: Insufficient documentation

## 2013-04-11 DIAGNOSIS — O24919 Unspecified diabetes mellitus in pregnancy, unspecified trimester: Secondary | ICD-10-CM | POA: Insufficient documentation

## 2013-04-11 DIAGNOSIS — E1139 Type 2 diabetes mellitus with other diabetic ophthalmic complication: Secondary | ICD-10-CM | POA: Insufficient documentation

## 2013-04-11 DIAGNOSIS — E669 Obesity, unspecified: Secondary | ICD-10-CM | POA: Insufficient documentation

## 2013-04-12 ENCOUNTER — Encounter: Payer: Self-pay | Admitting: *Deleted

## 2013-04-12 DIAGNOSIS — O0990 Supervision of high risk pregnancy, unspecified, unspecified trimester: Secondary | ICD-10-CM

## 2013-04-15 ENCOUNTER — Ambulatory Visit (INDEPENDENT_AMBULATORY_CARE_PROVIDER_SITE_OTHER): Payer: BC Managed Care – PPO | Admitting: General Practice

## 2013-04-15 VITALS — BP 148/89 | HR 81 | Ht 66.0 in | Wt 378.0 lb

## 2013-04-15 DIAGNOSIS — O132 Gestational [pregnancy-induced] hypertension without significant proteinuria, second trimester: Secondary | ICD-10-CM

## 2013-04-15 DIAGNOSIS — O139 Gestational [pregnancy-induced] hypertension without significant proteinuria, unspecified trimester: Secondary | ICD-10-CM

## 2013-04-15 MED ORDER — METHYLDOPA 250 MG PO TABS: 500.0000 mg | ORAL_TABLET | Freq: Three times a day (TID) | ORAL | Status: DC

## 2013-04-15 NOTE — Progress Notes (Addendum)
Reviewed chart with Dr Debroah Loop & patient's BP today. Increased aldomet to 500 mg TID. Reviewed instructions with patient, patient verbalizes understanding

## 2013-04-22 ENCOUNTER — Encounter: Payer: Self-pay | Admitting: Obstetrics and Gynecology

## 2013-04-22 ENCOUNTER — Ambulatory Visit (INDEPENDENT_AMBULATORY_CARE_PROVIDER_SITE_OTHER): Payer: BC Managed Care – PPO | Admitting: Obstetrics and Gynecology

## 2013-04-22 VITALS — BP 133/81 | Temp 97.2°F | Wt 379.2 lb

## 2013-04-22 DIAGNOSIS — O24912 Unspecified diabetes mellitus in pregnancy, second trimester: Secondary | ICD-10-CM

## 2013-04-22 DIAGNOSIS — E669 Obesity, unspecified: Secondary | ICD-10-CM

## 2013-04-22 DIAGNOSIS — O10019 Pre-existing essential hypertension complicating pregnancy, unspecified trimester: Secondary | ICD-10-CM

## 2013-04-22 DIAGNOSIS — O10012 Pre-existing essential hypertension complicating pregnancy, second trimester: Secondary | ICD-10-CM

## 2013-04-22 DIAGNOSIS — I10 Essential (primary) hypertension: Secondary | ICD-10-CM

## 2013-04-22 DIAGNOSIS — E119 Type 2 diabetes mellitus without complications: Secondary | ICD-10-CM

## 2013-04-22 DIAGNOSIS — O99212 Obesity complicating pregnancy, second trimester: Secondary | ICD-10-CM

## 2013-04-22 DIAGNOSIS — O0990 Supervision of high risk pregnancy, unspecified, unspecified trimester: Secondary | ICD-10-CM

## 2013-04-22 DIAGNOSIS — O24919 Unspecified diabetes mellitus in pregnancy, unspecified trimester: Secondary | ICD-10-CM

## 2013-04-22 LAB — POCT URINALYSIS DIP (DEVICE)
Bilirubin Urine: NEGATIVE
Hgb urine dipstick: NEGATIVE
Ketones, ur: NEGATIVE mg/dL
Leukocytes, UA: NEGATIVE
Protein, ur: NEGATIVE mg/dL
Specific Gravity, Urine: >=1.03 (ref 1.005–1.030)
pH: 6 (ref 5.0–8.0)

## 2013-04-22 NOTE — Progress Notes (Signed)
FBS 90-152 (most in the 90's) 2hr pp 80-175 (4/18 out of range). Patient not always taking glyburide, sometimes takes glyburide in am and for lunch. Patient also often skips meals. Reviewed importance of diet and timing of medications. Patient taking BP meds adequately. Will schedule follow up anatomy for week of 10/13

## 2013-04-22 NOTE — Progress Notes (Signed)
U/S scheduled with MFM on 05/08/13 at 930 am.

## 2013-04-22 NOTE — Progress Notes (Signed)
Pulse- 85 Patient reports pelvic pressure, thinks she is starting to feel some fetal movement

## 2013-05-06 ENCOUNTER — Ambulatory Visit (INDEPENDENT_AMBULATORY_CARE_PROVIDER_SITE_OTHER): Payer: BC Managed Care – PPO | Admitting: Obstetrics and Gynecology

## 2013-05-06 ENCOUNTER — Encounter: Payer: Self-pay | Admitting: Obstetrics & Gynecology

## 2013-05-06 VITALS — BP 133/82 | Wt 377.3 lb

## 2013-05-06 DIAGNOSIS — N949 Unspecified condition associated with female genital organs and menstrual cycle: Secondary | ICD-10-CM

## 2013-05-06 DIAGNOSIS — Z23 Encounter for immunization: Secondary | ICD-10-CM

## 2013-05-06 DIAGNOSIS — O24912 Unspecified diabetes mellitus in pregnancy, second trimester: Secondary | ICD-10-CM

## 2013-05-06 DIAGNOSIS — O24919 Unspecified diabetes mellitus in pregnancy, unspecified trimester: Secondary | ICD-10-CM

## 2013-05-06 LAB — POCT URINALYSIS DIP (DEVICE)
Hgb urine dipstick: NEGATIVE
Ketones, ur: NEGATIVE mg/dL
Protein, ur: NEGATIVE mg/dL
Specific Gravity, Urine: 1.025 (ref 1.005–1.030)
Urobilinogen, UA: 0.2 mg/dL (ref 0.0–1.0)
pH: 6 (ref 5.0–8.0)

## 2013-05-06 NOTE — Patient Instructions (Signed)

## 2013-05-06 NOTE — Progress Notes (Signed)
Reviewed log book: all CBGs within range since working on diet and metformin 1000 bid glyburide 2.5 mg bid. [place] in 2 d.  BP improved on increased Aldomet. RLP discussed.

## 2013-05-06 NOTE — Progress Notes (Signed)
Pulse: 83

## 2013-05-07 ENCOUNTER — Other Ambulatory Visit: Payer: Self-pay | Admitting: Obstetrics and Gynecology

## 2013-05-07 DIAGNOSIS — E669 Obesity, unspecified: Secondary | ICD-10-CM

## 2013-05-07 DIAGNOSIS — O24919 Unspecified diabetes mellitus in pregnancy, unspecified trimester: Secondary | ICD-10-CM

## 2013-05-07 DIAGNOSIS — O10019 Pre-existing essential hypertension complicating pregnancy, unspecified trimester: Secondary | ICD-10-CM

## 2013-05-08 ENCOUNTER — Ambulatory Visit (HOSPITAL_COMMUNITY)
Admission: RE | Admit: 2013-05-08 | Discharge: 2013-05-08 | Disposition: A | Discharge status: Home / Self Care | Payer: BC Managed Care – PPO | Source: Ambulatory Visit | Attending: Obstetrics and Gynecology | Admitting: Obstetrics and Gynecology

## 2013-05-08 VITALS — BP 116/66 | HR 83 | Wt 376.0 lb

## 2013-05-08 DIAGNOSIS — O24919 Unspecified diabetes mellitus in pregnancy, unspecified trimester: Secondary | ICD-10-CM | POA: Insufficient documentation

## 2013-05-08 DIAGNOSIS — E1139 Type 2 diabetes mellitus with other diabetic ophthalmic complication: Secondary | ICD-10-CM | POA: Insufficient documentation

## 2013-05-08 DIAGNOSIS — O24912 Unspecified diabetes mellitus in pregnancy, second trimester: Secondary | ICD-10-CM

## 2013-05-08 DIAGNOSIS — O10019 Pre-existing essential hypertension complicating pregnancy, unspecified trimester: Secondary | ICD-10-CM | POA: Insufficient documentation

## 2013-05-08 DIAGNOSIS — Z3689 Encounter for other specified antenatal screening: Secondary | ICD-10-CM | POA: Insufficient documentation

## 2013-05-08 DIAGNOSIS — O10012 Pre-existing essential hypertension complicating pregnancy, second trimester: Secondary | ICD-10-CM

## 2013-05-08 DIAGNOSIS — O09299 Supervision of pregnancy with other poor reproductive or obstetric history, unspecified trimester: Secondary | ICD-10-CM | POA: Insufficient documentation

## 2013-05-08 DIAGNOSIS — E669 Obesity, unspecified: Secondary | ICD-10-CM | POA: Insufficient documentation

## 2013-05-08 DIAGNOSIS — O0990 Supervision of high risk pregnancy, unspecified, unspecified trimester: Secondary | ICD-10-CM

## 2013-05-08 NOTE — Progress Notes (Signed)
Diamond Pace was seen for ultrasound appointment today.  Please see [facility] report for details.

## 2013-05-13 ENCOUNTER — Ambulatory Visit: Payer: Self-pay | Admitting: Certified Nurse Midwife

## 2013-05-20 ENCOUNTER — Ambulatory Visit (INDEPENDENT_AMBULATORY_CARE_PROVIDER_SITE_OTHER): Payer: BC Managed Care – PPO | Admitting: Obstetrics and Gynecology

## 2013-05-20 VITALS — BP 151/84 | Temp 96.8°F | Wt 380.3 lb

## 2013-05-20 DIAGNOSIS — O99810 Abnormal glucose complicating pregnancy: Secondary | ICD-10-CM

## 2013-05-20 DIAGNOSIS — E669 Obesity, unspecified: Secondary | ICD-10-CM

## 2013-05-20 DIAGNOSIS — O99212 Obesity complicating pregnancy, second trimester: Secondary | ICD-10-CM

## 2013-05-20 DIAGNOSIS — O24419 Gestational diabetes mellitus in pregnancy, unspecified control: Secondary | ICD-10-CM

## 2013-05-20 LAB — POCT URINALYSIS DIP (DEVICE)
Glucose, UA: NEGATIVE mg/dL
Hgb urine dipstick: NEGATIVE
Specific Gravity, Urine: 1.025 (ref 1.005–1.030)
Urobilinogen, UA: 0.2 mg/dL (ref 0.0–1.0)
pH: 6 (ref 5.0–8.0)

## 2013-05-20 NOTE — Progress Notes (Signed)
Pulse: 89

## 2013-05-20 NOTE — Patient Instructions (Signed)

## 2013-05-20 NOTE — Progress Notes (Signed)
[place] scheduled 11/11.  CBGs good with 1 fasting 97, 1 PP 143 and all others within range.  Did not take Aldomet yet this am. BP recheck: inadvertently not done.

## 2013-05-27 ENCOUNTER — Other Ambulatory Visit: Payer: Self-pay | Admitting: Obstetrics & Gynecology

## 2013-05-29 ENCOUNTER — Telehealth: Payer: Self-pay | Admitting: *Deleted

## 2013-05-29 NOTE — Telephone Encounter (Signed)
Pt called Nurse line and left message about round ligament pain with tylenol use.  Pt reports going to pharmacy, parked in handicap space.  Pt received a ticket.  Police informed her if we could write her a prescription for handicap sticker she could get out of a ticket.    Called patient, informed pt that we are unable to help her with her ticket or handicap sticker.  Discussed comfort measures for round ligament pain. Pt confirms appt for Monday.

## 2013-06-03 ENCOUNTER — Ambulatory Visit (INDEPENDENT_AMBULATORY_CARE_PROVIDER_SITE_OTHER): Payer: BC Managed Care – PPO | Admitting: Obstetrics & Gynecology

## 2013-06-03 VITALS — BP 133/85 | Temp 97.1°F | Wt 384.0 lb

## 2013-06-03 DIAGNOSIS — Z23 Encounter for immunization: Secondary | ICD-10-CM

## 2013-06-03 DIAGNOSIS — O10013 Pre-existing essential hypertension complicating pregnancy, third trimester: Secondary | ICD-10-CM

## 2013-06-03 DIAGNOSIS — E119 Type 2 diabetes mellitus without complications: Secondary | ICD-10-CM

## 2013-06-03 DIAGNOSIS — M545 Low back pain, unspecified: Secondary | ICD-10-CM

## 2013-06-03 DIAGNOSIS — O24919 Unspecified diabetes mellitus in pregnancy, unspecified trimester: Secondary | ICD-10-CM

## 2013-06-03 DIAGNOSIS — O10019 Pre-existing essential hypertension complicating pregnancy, unspecified trimester: Secondary | ICD-10-CM

## 2013-06-03 DIAGNOSIS — O0990 Supervision of high risk pregnancy, unspecified, unspecified trimester: Secondary | ICD-10-CM

## 2013-06-03 DIAGNOSIS — O9989 Other specified diseases and conditions complicating pregnancy, childbirth and the puerperium: Secondary | ICD-10-CM

## 2013-06-03 DIAGNOSIS — O99213 Obesity complicating pregnancy, third trimester: Secondary | ICD-10-CM

## 2013-06-03 DIAGNOSIS — O24913 Unspecified diabetes mellitus in pregnancy, third trimester: Secondary | ICD-10-CM

## 2013-06-03 DIAGNOSIS — E669 Obesity, unspecified: Secondary | ICD-10-CM

## 2013-06-03 LAB — COMPREHENSIVE METABOLIC PANEL
ALT: 9 U/L (ref 0–35)
Alkaline Phosphatase: 74 U/L (ref 39–117)
CO2: 22 mEq/L (ref 19–32)
Creat: 0.47 mg/dL — ABNORMAL LOW (ref 0.50–1.10)
Glucose, Bld: 203 mg/dL — ABNORMAL HIGH (ref 70–99)
Total Bilirubin: 0.2 mg/dL — ABNORMAL LOW (ref 0.3–1.2)

## 2013-06-03 LAB — CBC
MCH: 28.7 pg (ref 26.0–34.0)
MCHC: 33 g/dL (ref 30.0–36.0)
MCV: 86.9 fL (ref 78.0–100.0)
Platelets: 336 10*3/uL (ref 150–400)
RBC: 3.66 MIL/uL — ABNORMAL LOW (ref 3.87–5.11)

## 2013-06-03 LAB — POCT URINALYSIS DIP (DEVICE)
Hgb urine dipstick: NEGATIVE
Nitrite: NEGATIVE
Protein, ur: NEGATIVE mg/dL
Urobilinogen, UA: 0.2 mg/dL (ref 0.0–1.0)
pH: 6.5 (ref 5.0–8.0)

## 2013-06-03 LAB — HEMOGLOBIN A1C
Hgb A1c MFr Bld: 6.4 % — ABNORMAL HIGH (ref <5.7)
Mean Plasma Glucose: 137 mg/dL — ABNORMAL HIGH (ref <117)

## 2013-06-03 MED ORDER — TETANUS-DIPHTH-ACELL PERTUSSIS 5-2.5-18.5 LF-MCG/0.5 IM SUSP: 0.5000 mL | Freq: Once | INTRAMUSCULAR | Status: DC

## 2013-06-03 MED ORDER — CYCLOBENZAPRINE HCL 10 MG PO TABS: 10.0000 mg | ORAL_TABLET | Freq: Three times a day (TID) | ORAL | Status: DC | PRN

## 2013-06-03 NOTE — Progress Notes (Signed)
P=92. C/o feeling very tired, weak- like" going to stumble" sometimes. Has felt like was going to faint several times at work.

## 2013-06-03 NOTE — Patient Instructions (Signed)
Return to clinic for any obstetric concerns or go to [facility] for evaluation  

## 2013-06-03 NOTE — Progress Notes (Signed)
Will check CBC and other third trimester labs today; also check preeclampsia evaluation labs.  Recommended keeping hydrated.  Also reported low back pain not alleviated by Tylenol, Flexeril prescribed.  Also reports feeling like she "passes out" when she lies down and her daughter and sister report having difficulty waking her up. Denies any SOB. If persists, may need sleep study to rule out OSA or other sleep problems.   Next ultrasound is scheduled on 11/14 with [facility]. Tdap vaccine today.  No other complaints or concerns.  Presyncope, preeclampsia, fetal movement and labor precautions reviewed.

## 2013-06-04 LAB — PROTEIN / CREATININE RATIO, URINE: Creatinine, Urine: 110.9 mg/dL

## 2013-06-04 LAB — HIV ANTIBODY (ROUTINE TESTING W REFLEX): HIV: NONREACTIVE

## 2013-06-06 ENCOUNTER — Encounter: Payer: Self-pay | Admitting: *Deleted

## 2013-06-07 ENCOUNTER — Encounter (HOSPITAL_COMMUNITY): Payer: Self-pay

## 2013-06-07 ENCOUNTER — Ambulatory Visit (HOSPITAL_COMMUNITY)
Admission: RE | Admit: 2013-06-07 | Discharge: 2013-06-07 | Disposition: A | Discharge status: Home / Self Care | Payer: BC Managed Care – PPO | Source: Ambulatory Visit | Attending: Obstetrics & Gynecology | Admitting: Obstetrics & Gynecology

## 2013-06-07 DIAGNOSIS — O0990 Supervision of high risk pregnancy, unspecified, unspecified trimester: Secondary | ICD-10-CM

## 2013-06-07 DIAGNOSIS — O24919 Unspecified diabetes mellitus in pregnancy, unspecified trimester: Secondary | ICD-10-CM | POA: Insufficient documentation

## 2013-06-07 DIAGNOSIS — O10019 Pre-existing essential hypertension complicating pregnancy, unspecified trimester: Secondary | ICD-10-CM | POA: Insufficient documentation

## 2013-06-07 DIAGNOSIS — O10012 Pre-existing essential hypertension complicating pregnancy, second trimester: Secondary | ICD-10-CM

## 2013-06-07 DIAGNOSIS — Z3689 Encounter for other specified antenatal screening: Secondary | ICD-10-CM | POA: Insufficient documentation

## 2013-06-07 DIAGNOSIS — O09299 Supervision of pregnancy with other poor reproductive or obstetric history, unspecified trimester: Secondary | ICD-10-CM | POA: Insufficient documentation

## 2013-06-07 DIAGNOSIS — O24912 Unspecified diabetes mellitus in pregnancy, second trimester: Secondary | ICD-10-CM

## 2013-06-07 DIAGNOSIS — E1139 Type 2 diabetes mellitus with other diabetic ophthalmic complication: Secondary | ICD-10-CM | POA: Insufficient documentation

## 2013-06-07 DIAGNOSIS — E669 Obesity, unspecified: Secondary | ICD-10-CM | POA: Insufficient documentation

## 2013-06-13 ENCOUNTER — Encounter: Payer: Self-pay | Admitting: *Deleted

## 2013-06-17 ENCOUNTER — Ambulatory Visit (INDEPENDENT_AMBULATORY_CARE_PROVIDER_SITE_OTHER): Payer: BC Managed Care – PPO | Admitting: Advanced Practice Midwife

## 2013-06-17 VITALS — BP 141/87 | Temp 98.0°F | Wt 380.4 lb

## 2013-06-17 DIAGNOSIS — O24911 Unspecified diabetes mellitus in pregnancy, first trimester: Secondary | ICD-10-CM

## 2013-06-17 DIAGNOSIS — O24919 Unspecified diabetes mellitus in pregnancy, unspecified trimester: Secondary | ICD-10-CM

## 2013-06-17 LAB — POCT URINALYSIS DIP (DEVICE)
Bilirubin Urine: NEGATIVE
Glucose, UA: NEGATIVE mg/dL
Ketones, ur: NEGATIVE mg/dL
Specific Gravity, Urine: 1.025 (ref 1.005–1.030)
Urobilinogen, UA: 0.2 mg/dL (ref 0.0–1.0)

## 2013-06-17 MED ORDER — PRENATAL MULTIVITAMIN CH: 1.0000 | ORAL_TABLET | Freq: Every day | ORAL | Status: DC

## 2013-06-17 MED ORDER — GLYBURIDE 2.5 MG PO TABS: 2.5000 mg | ORAL_TABLET | Freq: Every day | ORAL | Status: DC

## 2013-06-17 MED ORDER — PANTOPRAZOLE SODIUM 40 MG PO TBEC: 40.0000 mg | DELAYED_RELEASE_TABLET | Freq: Every day | ORAL | Status: AC

## 2013-06-17 NOTE — Progress Notes (Signed)
Pulse- 91 Patient reports pressure in lower back & pelvis; requests refills on multiple medications/vitamins

## 2013-06-17 NOTE — Progress Notes (Signed)
Pt reports sharp pain in her pelvis, painful to get out of bed. Flexeril helping some.  Good fetal movement, no vaginal bleeding, LOF.  Pt reports she did not take her Aldomet this morning but plans to get water and take it when she leaves office.  She is taking it TID as prescribed.  Blood glucose log reviewed (Fasting 94-124 with one outlier of 130, pt reports she has been out of morning dose of Glyburide; PP 1 out of 10 above 125).  Discussed diet, pt has been to birthday parties and eaten cake a few times.  Recommend walking daily.  Pt having heartburn, Tums not working, used Prilosec before pregnancy which helped.

## 2013-06-24 ENCOUNTER — Encounter: Payer: Self-pay | Admitting: Obstetrics & Gynecology

## 2013-06-24 ENCOUNTER — Ambulatory Visit (INDEPENDENT_AMBULATORY_CARE_PROVIDER_SITE_OTHER): Payer: BC Managed Care – PPO | Admitting: Obstetrics & Gynecology

## 2013-06-24 VITALS — BP 143/84 | Temp 97.4°F | Wt 379.2 lb

## 2013-06-24 DIAGNOSIS — O10913 Unspecified pre-existing hypertension complicating pregnancy, third trimester: Secondary | ICD-10-CM

## 2013-06-24 DIAGNOSIS — O169 Unspecified maternal hypertension, unspecified trimester: Secondary | ICD-10-CM

## 2013-06-24 DIAGNOSIS — O10019 Pre-existing essential hypertension complicating pregnancy, unspecified trimester: Secondary | ICD-10-CM | POA: Insufficient documentation

## 2013-06-24 DIAGNOSIS — G4710 Hypersomnia, unspecified: Secondary | ICD-10-CM

## 2013-06-24 LAB — POCT URINALYSIS DIP (DEVICE)
Bilirubin Urine: NEGATIVE
Glucose, UA: NEGATIVE mg/dL
Hgb urine dipstick: NEGATIVE
Nitrite: NEGATIVE
Specific Gravity, Urine: >=1.03 (ref 1.005–1.030)

## 2013-06-24 NOTE — Progress Notes (Signed)
P= 91 Second BP 138/70 manually.  Pt. C/o of ears hurting and warmth behind hears, states ears pop and hurt really bad and has been like this for 3 weeks. States she will be making an appointment with PCP but has been having headaches along with ears hurting.

## 2013-06-24 NOTE — Progress Notes (Signed)
Growth [place] on December 12th.  HTN controlled. Fasting 112,95,89,91,86,91,90 2 hr breakfast not doing 2 hr after lunch 120,100,92,100,98,94,92 3 hr after dinner (pt goes to bed at 7 pm and sleeps a bit, gets up and then eats again) 115,101,103,120,1185,111,121  Pt sleeps 12 hours a day.  Will check TSH and have family members check and see if she is snoring / sleep apnea.

## 2013-06-24 NOTE — Patient Instructions (Signed)
Levonorgestrel intrauterine device (IUD) What is this medicine? LEVONORGESTREL IUD (LEE voe nor jes trel) is a contraceptive (birth control) device. The device is placed inside the uterus by a healthcare professional. It is used to prevent pregnancy and can also be used to treat heavy bleeding that occurs during your period. Depending on the device, it can be used for 3 to 5 years. This medicine may be used for other purposes; ask your health care provider or pharmacist if you have questions. COMMON BRAND NAME(S): Mirena, Skyla What should I tell my health care provider before I take this medicine? They need to know if you have any of these conditions: -abnormal Pap smear -cancer of the breast, uterus, or cervix -diabetes -endometritis -genital or pelvic infection now or in the past -have more than one sexual partner or your partner has more than one partner -heart disease -history of an ectopic or tubal pregnancy -immune system problems -IUD in place -liver disease or tumor -problems with blood clots or take blood-thinners -use intravenous drugs -uterus of unusual shape -vaginal bleeding that has not been explained -an unusual or allergic reaction to levonorgestrel, other hormones, silicone, or polyethylene, medicines, foods, dyes, or preservatives -pregnant or trying to get pregnant -breast-feeding How should I use this medicine? This device is placed inside the uterus by a health care professional. Talk to your pediatrician regarding the use of this medicine in children. Special care may be needed. Overdosage: If you think you have taken too much of this medicine contact a poison control center or emergency room at once. NOTE: This medicine is only for you. Do not share this medicine with others. What if I miss a dose? This does not apply. What may interact with this medicine? Do not take this medicine with any of the following  medications: -amprenavir -bosentan -fosamprenavir This medicine may also interact with the following medications: -aprepitant -barbiturate medicines for inducing sleep or treating seizures -bexarotene -griseofulvin -medicines to treat seizures like carbamazepine, ethotoin, felbamate, oxcarbazepine, phenytoin, topiramate -modafinil -pioglitazone -rifabutin -rifampin -rifapentine -some medicines to treat HIV infection like atazanavir, indinavir, lopinavir, nelfinavir, tipranavir, ritonavir -St. John's wort -warfarin This list may not describe all possible interactions. Give your health care provider a list of all the medicines, herbs, non-prescription drugs, or dietary supplements you use. Also tell them if you smoke, drink alcohol, or use illegal drugs. Some items may interact with your medicine. What should I watch for while using this medicine? Visit your doctor or health care professional for regular check ups. See your doctor if you or your partner has sexual contact with others, becomes HIV positive, or gets a sexual transmitted disease. This product does not protect you against HIV infection (AIDS) or other sexually transmitted diseases. You can check the placement of the IUD yourself by reaching up to the top of your vagina with clean fingers to feel the threads. Do not pull on the threads. It is a good habit to check placement after each menstrual period. Call your doctor right away if you feel more of the IUD than just the threads or if you cannot feel the threads at all. The IUD may come out by itself. You may become pregnant if the device comes out. If you notice that the IUD has come out use a backup birth control method like condoms and call your health care provider. Using tampons will not change the position of the IUD and are okay to use during your period. What side effects may I   notice from receiving this medicine? Side effects that you should report to your doctor or  health care professional as soon as possible: -allergic reactions like skin rash, itching or hives, swelling of the face, lips, or tongue -fever, flu-like symptoms -genital sores -high blood pressure -no menstrual period for 6 weeks during use -pain, swelling, warmth in the leg -pelvic pain or tenderness -severe or sudden headache -signs of pregnancy -stomach cramping -sudden shortness of breath -trouble with balance, talking, or walking -unusual vaginal bleeding, discharge -yellowing of the eyes or skin Side effects that usually do not require medical attention (report to your doctor or health care professional if they continue or are bothersome): -acne -breast pain -change in sex drive or performance -changes in weight -cramping, dizziness, or faintness while the device is being inserted -headache -irregular menstrual bleeding within first 3 to 6 months of use -nausea This list may not describe all possible side effects. Call your doctor for medical advice about side effects. You may report side effects to FDA at 1-800-FDA-1088. Where should I keep my medicine? This does not apply. NOTE: This sheet is a summary. It may not cover all possible information. If you have questions about this medicine, talk to your doctor, pharmacist, or health care provider.  2014, [employer] Standard. (2011-08-11 13:54:04)  

## 2013-07-01 ENCOUNTER — Encounter: Payer: Self-pay | Admitting: Obstetrics and Gynecology

## 2013-07-01 ENCOUNTER — Ambulatory Visit (INDEPENDENT_AMBULATORY_CARE_PROVIDER_SITE_OTHER): Payer: BC Managed Care – PPO | Admitting: Obstetrics and Gynecology

## 2013-07-01 VITALS — BP 137/91 | Wt 389.1 lb

## 2013-07-01 DIAGNOSIS — O10913 Unspecified pre-existing hypertension complicating pregnancy, third trimester: Secondary | ICD-10-CM

## 2013-07-01 DIAGNOSIS — O169 Unspecified maternal hypertension, unspecified trimester: Secondary | ICD-10-CM

## 2013-07-01 DIAGNOSIS — O24913 Unspecified diabetes mellitus in pregnancy, third trimester: Secondary | ICD-10-CM

## 2013-07-01 DIAGNOSIS — O99213 Obesity complicating pregnancy, third trimester: Secondary | ICD-10-CM

## 2013-07-01 DIAGNOSIS — E669 Obesity, unspecified: Secondary | ICD-10-CM

## 2013-07-01 DIAGNOSIS — O0990 Supervision of high risk pregnancy, unspecified, unspecified trimester: Secondary | ICD-10-CM

## 2013-07-01 DIAGNOSIS — E119 Type 2 diabetes mellitus without complications: Secondary | ICD-10-CM

## 2013-07-01 DIAGNOSIS — O24919 Unspecified diabetes mellitus in pregnancy, unspecified trimester: Secondary | ICD-10-CM

## 2013-07-01 LAB — POCT URINALYSIS DIP (DEVICE)
Bilirubin Urine: NEGATIVE
Glucose, UA: NEGATIVE mg/dL
Hgb urine dipstick: NEGATIVE
Nitrite: NEGATIVE

## 2013-07-01 NOTE — Progress Notes (Signed)
Pulse: 82  Had a 10 lb weight gain since last visit. Notices some swelling in her feet but nothing major.

## 2013-07-01 NOTE — Progress Notes (Signed)
Patient doing well without complaints. States she is not sleeping as much as she did last week. Patient did not bring meter or log book today (she left them at home and will not have it all day as she is headed to work now). Patient reports no change in her values. Follow up growth ultrasound already scheduled. Will start twice weekly fetal testing next week

## 2013-07-04 ENCOUNTER — Other Ambulatory Visit: Payer: Self-pay | Admitting: Family Medicine

## 2013-07-04 DIAGNOSIS — E119 Type 2 diabetes mellitus without complications: Secondary | ICD-10-CM

## 2013-07-04 DIAGNOSIS — O169 Unspecified maternal hypertension, unspecified trimester: Secondary | ICD-10-CM

## 2013-07-05 ENCOUNTER — Other Ambulatory Visit: Payer: Self-pay | Admitting: Family Medicine

## 2013-07-05 ENCOUNTER — Other Ambulatory Visit: Payer: Self-pay | Admitting: Obstetrics & Gynecology

## 2013-07-05 ENCOUNTER — Ambulatory Visit (HOSPITAL_COMMUNITY)
Admission: RE | Admit: 2013-07-05 | Discharge: 2013-07-05 | Disposition: A | Discharge status: Home / Self Care | Payer: BC Managed Care – PPO | Source: Ambulatory Visit | Attending: Obstetrics and Gynecology | Admitting: Obstetrics and Gynecology

## 2013-07-05 VITALS — BP 135/79 | HR 82

## 2013-07-05 DIAGNOSIS — O10019 Pre-existing essential hypertension complicating pregnancy, unspecified trimester: Secondary | ICD-10-CM | POA: Insufficient documentation

## 2013-07-05 DIAGNOSIS — E119 Type 2 diabetes mellitus without complications: Secondary | ICD-10-CM

## 2013-07-05 DIAGNOSIS — O24919 Unspecified diabetes mellitus in pregnancy, unspecified trimester: Secondary | ICD-10-CM | POA: Insufficient documentation

## 2013-07-05 DIAGNOSIS — E669 Obesity, unspecified: Secondary | ICD-10-CM | POA: Insufficient documentation

## 2013-07-05 DIAGNOSIS — O09299 Supervision of pregnancy with other poor reproductive or obstetric history, unspecified trimester: Secondary | ICD-10-CM | POA: Insufficient documentation

## 2013-07-05 DIAGNOSIS — E1139 Type 2 diabetes mellitus with other diabetic ophthalmic complication: Secondary | ICD-10-CM | POA: Insufficient documentation

## 2013-07-05 DIAGNOSIS — O169 Unspecified maternal hypertension, unspecified trimester: Secondary | ICD-10-CM

## 2013-07-05 DIAGNOSIS — O10913 Unspecified pre-existing hypertension complicating pregnancy, third trimester: Secondary | ICD-10-CM

## 2013-07-05 DIAGNOSIS — O0990 Supervision of high risk pregnancy, unspecified, unspecified trimester: Secondary | ICD-10-CM

## 2013-07-05 NOTE — Progress Notes (Signed)
Diamond Pace was seen for ultrasound appointment today.  Please see [facility] report for details.  

## 2013-07-08 ENCOUNTER — Ambulatory Visit (INDEPENDENT_AMBULATORY_CARE_PROVIDER_SITE_OTHER): Payer: BC Managed Care – PPO | Admitting: Obstetrics & Gynecology

## 2013-07-08 VITALS — BP 118/81 | Wt 392.3 lb

## 2013-07-08 DIAGNOSIS — O24913 Unspecified diabetes mellitus in pregnancy, third trimester: Secondary | ICD-10-CM

## 2013-07-08 DIAGNOSIS — O10019 Pre-existing essential hypertension complicating pregnancy, unspecified trimester: Secondary | ICD-10-CM

## 2013-07-08 DIAGNOSIS — O10013 Pre-existing essential hypertension complicating pregnancy, third trimester: Secondary | ICD-10-CM

## 2013-07-08 DIAGNOSIS — O24919 Unspecified diabetes mellitus in pregnancy, unspecified trimester: Secondary | ICD-10-CM

## 2013-07-08 LAB — POCT URINALYSIS DIP (DEVICE)
Bilirubin Urine: NEGATIVE
Hgb urine dipstick: NEGATIVE
Ketones, ur: NEGATIVE mg/dL
Protein, ur: NEGATIVE mg/dL
Specific Gravity, Urine: >=1.03 (ref 1.005–1.030)
pH: 6 (ref 5.0–8.0)

## 2013-07-08 MED ORDER — METHYLDOPA 500 MG PO TABS: 500.0000 mg | ORAL_TABLET | Freq: Three times a day (TID) | ORAL | Status: DC

## 2013-07-08 NOTE — Progress Notes (Signed)
P = 82    Pt will have weekly AFI/NST @ MFM on Fridays and NST during prenatal visits on Mondays.  Next [place] for growth on 08/02/13.

## 2013-07-08 NOTE — Progress Notes (Signed)
Fastings:  91,89,90,92,90,88,102  2 hr br: 103,110,97,104,100,97;  2 hr lu  124,130,101,118,112,105,115;  2 hr din: 118,120,108,121,116,131  Good control with glyburide and metformin BP controled. Daughter says she dies snore some but not usually.  Consider sleep study.  Growth on 12/12 = >90%  HA f/u US on 08/02/13 Continue 2x/week testing.  NST reactive today.  Pt considering IUD.

## 2013-07-11 ENCOUNTER — Encounter: Payer: Self-pay | Admitting: Family Medicine

## 2013-07-11 ENCOUNTER — Other Ambulatory Visit: Payer: BC Managed Care – PPO

## 2013-07-11 ENCOUNTER — Other Ambulatory Visit: Payer: Self-pay | Admitting: Obstetrics & Gynecology

## 2013-07-11 DIAGNOSIS — O10013 Pre-existing essential hypertension complicating pregnancy, third trimester: Secondary | ICD-10-CM

## 2013-07-11 DIAGNOSIS — O24913 Unspecified diabetes mellitus in pregnancy, third trimester: Secondary | ICD-10-CM

## 2013-07-11 DIAGNOSIS — O3663X0 Maternal care for excessive fetal growth, third trimester, not applicable or unspecified: Secondary | ICD-10-CM | POA: Insufficient documentation

## 2013-07-12 ENCOUNTER — Other Ambulatory Visit (HOSPITAL_COMMUNITY): Payer: BC Managed Care – PPO

## 2013-07-12 ENCOUNTER — Ambulatory Visit (HOSPITAL_COMMUNITY)
Admission: RE | Admit: 2013-07-12 | Discharge: 2013-07-12 | Disposition: A | Discharge status: Home / Self Care | Payer: BC Managed Care – PPO | Source: Ambulatory Visit | Attending: Obstetrics & Gynecology | Admitting: Obstetrics & Gynecology

## 2013-07-12 ENCOUNTER — Ambulatory Visit (HOSPITAL_COMMUNITY): Payer: BC Managed Care – PPO

## 2013-07-12 ENCOUNTER — Ambulatory Visit (HOSPITAL_COMMUNITY)
Admission: RE | Admit: 2013-07-12 | Discharge: 2013-07-12 | Disposition: A | Discharge status: Home / Self Care | Payer: BC Managed Care – PPO | Source: Ambulatory Visit | Attending: Family Medicine | Admitting: Family Medicine

## 2013-07-12 VITALS — BP 116/62 | HR 78 | Wt 394.0 lb

## 2013-07-12 DIAGNOSIS — O10019 Pre-existing essential hypertension complicating pregnancy, unspecified trimester: Secondary | ICD-10-CM | POA: Insufficient documentation

## 2013-07-12 DIAGNOSIS — E1139 Type 2 diabetes mellitus with other diabetic ophthalmic complication: Secondary | ICD-10-CM | POA: Insufficient documentation

## 2013-07-12 DIAGNOSIS — O09299 Supervision of pregnancy with other poor reproductive or obstetric history, unspecified trimester: Secondary | ICD-10-CM | POA: Insufficient documentation

## 2013-07-12 DIAGNOSIS — O3663X0 Maternal care for excessive fetal growth, third trimester, not applicable or unspecified: Secondary | ICD-10-CM

## 2013-07-12 DIAGNOSIS — O24913 Unspecified diabetes mellitus in pregnancy, third trimester: Secondary | ICD-10-CM

## 2013-07-12 DIAGNOSIS — O0990 Supervision of high risk pregnancy, unspecified, unspecified trimester: Secondary | ICD-10-CM

## 2013-07-12 DIAGNOSIS — O24919 Unspecified diabetes mellitus in pregnancy, unspecified trimester: Secondary | ICD-10-CM | POA: Insufficient documentation

## 2013-07-12 DIAGNOSIS — O10013 Pre-existing essential hypertension complicating pregnancy, third trimester: Secondary | ICD-10-CM

## 2013-07-12 DIAGNOSIS — E669 Obesity, unspecified: Secondary | ICD-10-CM | POA: Insufficient documentation

## 2013-07-12 NOTE — ED Notes (Addendum)
Reactive NST, baseline FHR 135 with moderate variability.   Dr. Sherrie George shown strip prior to pt leaving.

## 2013-07-15 ENCOUNTER — Encounter: Payer: Self-pay | Admitting: Obstetrics and Gynecology

## 2013-07-15 ENCOUNTER — Ambulatory Visit (INDEPENDENT_AMBULATORY_CARE_PROVIDER_SITE_OTHER): Payer: BC Managed Care – PPO | Admitting: Obstetrics and Gynecology

## 2013-07-15 VITALS — BP 132/69 | Wt >= 6400 oz

## 2013-07-15 DIAGNOSIS — O3663X0 Maternal care for excessive fetal growth, third trimester, not applicable or unspecified: Secondary | ICD-10-CM

## 2013-07-15 DIAGNOSIS — O10013 Pre-existing essential hypertension complicating pregnancy, third trimester: Secondary | ICD-10-CM

## 2013-07-15 DIAGNOSIS — O0990 Supervision of high risk pregnancy, unspecified, unspecified trimester: Secondary | ICD-10-CM

## 2013-07-15 DIAGNOSIS — O10019 Pre-existing essential hypertension complicating pregnancy, unspecified trimester: Secondary | ICD-10-CM

## 2013-07-15 DIAGNOSIS — E669 Obesity, unspecified: Secondary | ICD-10-CM

## 2013-07-15 DIAGNOSIS — O99213 Obesity complicating pregnancy, third trimester: Secondary | ICD-10-CM

## 2013-07-15 DIAGNOSIS — O24919 Unspecified diabetes mellitus in pregnancy, unspecified trimester: Secondary | ICD-10-CM

## 2013-07-15 DIAGNOSIS — O3660X0 Maternal care for excessive fetal growth, unspecified trimester, not applicable or unspecified: Secondary | ICD-10-CM

## 2013-07-15 DIAGNOSIS — O24913 Unspecified diabetes mellitus in pregnancy, third trimester: Secondary | ICD-10-CM

## 2013-07-15 LAB — POCT URINALYSIS DIP (DEVICE)
Glucose, UA: NEGATIVE mg/dL
Hgb urine dipstick: NEGATIVE
Ketones, ur: NEGATIVE mg/dL
Protein, ur: NEGATIVE mg/dL
Specific Gravity, Urine: 1.025 (ref 1.005–1.030)

## 2013-07-15 NOTE — Progress Notes (Signed)
P = 87   10lb weight gain in 1 week.  Pt states her eating habits have not changed.

## 2013-07-15 NOTE — Progress Notes (Signed)
NST reviewed and reactive. Patient with a lot of third trimester complaints: pelvic pressure, swollen feet and hands. Patient with continued nausea and emesis, was not able to take antiglycemic or antihypertensive meds last night. She did not bring log book but reports fasting this morning 130, most are in the 85 range. She reports pp less than 120. Patient scheduled for f/u growth on 1/9. Patient concerned that she may not be able to have vaginal birth secondary to infant size. Risks of vaginal birth associated with fetal macrosomia reviewed and explained to the patient. Patient informed that more information will be available with f/u growth.

## 2013-07-17 ENCOUNTER — Other Ambulatory Visit: Payer: Self-pay | Admitting: Obstetrics & Gynecology

## 2013-07-17 ENCOUNTER — Encounter (HOSPITAL_COMMUNITY): Payer: Self-pay

## 2013-07-17 ENCOUNTER — Ambulatory Visit (HOSPITAL_COMMUNITY)
Admission: RE | Admit: 2013-07-17 | Discharge: 2013-07-17 | Disposition: A | Discharge status: Home / Self Care | Payer: BC Managed Care – PPO | Source: Ambulatory Visit | Attending: Obstetrics & Gynecology | Admitting: Obstetrics & Gynecology

## 2013-07-17 ENCOUNTER — Ambulatory Visit (HOSPITAL_COMMUNITY): Payer: BC Managed Care – PPO

## 2013-07-17 VITALS — BP 115/81 | HR 82

## 2013-07-17 DIAGNOSIS — O09299 Supervision of pregnancy with other poor reproductive or obstetric history, unspecified trimester: Secondary | ICD-10-CM | POA: Insufficient documentation

## 2013-07-17 DIAGNOSIS — O24913 Unspecified diabetes mellitus in pregnancy, third trimester: Secondary | ICD-10-CM

## 2013-07-17 DIAGNOSIS — O10013 Pre-existing essential hypertension complicating pregnancy, third trimester: Secondary | ICD-10-CM

## 2013-07-17 DIAGNOSIS — E669 Obesity, unspecified: Secondary | ICD-10-CM | POA: Insufficient documentation

## 2013-07-17 DIAGNOSIS — O3663X0 Maternal care for excessive fetal growth, third trimester, not applicable or unspecified: Secondary | ICD-10-CM

## 2013-07-17 DIAGNOSIS — O24919 Unspecified diabetes mellitus in pregnancy, unspecified trimester: Secondary | ICD-10-CM | POA: Insufficient documentation

## 2013-07-17 DIAGNOSIS — O10019 Pre-existing essential hypertension complicating pregnancy, unspecified trimester: Secondary | ICD-10-CM | POA: Insufficient documentation

## 2013-07-17 DIAGNOSIS — O0990 Supervision of high risk pregnancy, unspecified, unspecified trimester: Secondary | ICD-10-CM

## 2013-07-17 DIAGNOSIS — E1139 Type 2 diabetes mellitus with other diabetic ophthalmic complication: Secondary | ICD-10-CM | POA: Insufficient documentation

## 2013-07-17 NOTE — Progress Notes (Signed)
Ariah Faries  was seen today for an ultrasound appointment.  See full report in [facility].  Impression: Single IUP at 33 5/7 weeks Pregestational diabetes on Glyburide and Metformin, Chronic hypertension Active fetus with BPP of 8/8 Normal amniotic fluid volume  Recommendations: Continue twice weekly NSTs with weekly AFIs   Growth at 36 weeks Would deliver at 39 weeks if testing is otherwise reassuring  Alpha Gula, MD

## 2013-07-22 ENCOUNTER — Ambulatory Visit (INDEPENDENT_AMBULATORY_CARE_PROVIDER_SITE_OTHER): Payer: BC Managed Care – PPO | Admitting: Family Medicine

## 2013-07-22 VITALS — BP 145/80 | Wt >= 6400 oz

## 2013-07-22 DIAGNOSIS — O24913 Unspecified diabetes mellitus in pregnancy, third trimester: Secondary | ICD-10-CM

## 2013-07-22 DIAGNOSIS — O24919 Unspecified diabetes mellitus in pregnancy, unspecified trimester: Secondary | ICD-10-CM

## 2013-07-22 DIAGNOSIS — O10019 Pre-existing essential hypertension complicating pregnancy, unspecified trimester: Secondary | ICD-10-CM

## 2013-07-22 DIAGNOSIS — O10013 Pre-existing essential hypertension complicating pregnancy, third trimester: Secondary | ICD-10-CM

## 2013-07-22 DIAGNOSIS — O0990 Supervision of high risk pregnancy, unspecified, unspecified trimester: Secondary | ICD-10-CM

## 2013-07-22 LAB — CBC
HCT: 29.7 % — ABNORMAL LOW (ref 36.0–46.0)
Hemoglobin: 10 g/dL — ABNORMAL LOW (ref 12.0–15.0)
MCHC: 33.7 g/dL (ref 30.0–36.0)
MCV: 84.1 fL (ref 78.0–100.0)
RBC: 3.53 MIL/uL — ABNORMAL LOW (ref 3.87–5.11)
RDW: 14.9 % (ref 11.5–15.5)

## 2013-07-22 LAB — POCT URINALYSIS DIP (DEVICE)
Glucose, UA: NEGATIVE mg/dL
Hgb urine dipstick: NEGATIVE
Ketones, ur: NEGATIVE mg/dL
Nitrite: NEGATIVE
pH: 6.5 (ref 5.0–8.0)

## 2013-07-22 LAB — COMPREHENSIVE METABOLIC PANEL
AST: 11 U/L (ref 0–37)
Albumin: 2.9 g/dL — ABNORMAL LOW (ref 3.5–5.2)
BUN: 12 mg/dL (ref 6–23)
CO2: 20 mEq/L (ref 19–32)
Calcium: 8.5 mg/dL (ref 8.4–10.5)
Glucose, Bld: 118 mg/dL — ABNORMAL HIGH (ref 70–99)
Potassium: 4 mEq/L (ref 3.5–5.3)
Sodium: 140 mEq/L (ref 135–145)
Total Protein: 5.8 g/dL — ABNORMAL LOW (ref 6.0–8.3)

## 2013-07-22 NOTE — Progress Notes (Signed)
No BP medication today 1+ protein today-and increasing swelling in thighs and on abdomen-check labs Having swelling in lower abdomen FBS 89-109 2 hour pp 85-130 most in range Has LGA fetus NST reviewed and reactive.

## 2013-07-22 NOTE — Patient Instructions (Signed)
Gestational Diabetes Mellitus Gestational diabetes mellitus, often simply referred to as gestational diabetes, is a type of diabetes that some women develop during pregnancy. In gestational diabetes, the pancreas does not make enough insulin (a hormone), the cells are less responsive to the insulin that is made (insulin resistance), or both.Normally, insulin moves sugars from food into the tissue cells. The tissue cells use the sugars for energy. The lack of insulin or the lack of normal response to insulin causes excess sugars to build up in the blood instead of going into the tissue cells. As a result, high blood sugar (hyperglycemia) develops. The effect of high sugar (glucose) levels can cause many complications.  RISK FACTORS You have an increased chance of developing gestational diabetes if you have a family history of diabetes and also have one or more of the following risk factors:  A body mass index over 30 (obesity).  A previous pregnancy with gestational diabetes.  An older age at the time of pregnancy. If blood glucose levels are kept in the normal range during pregnancy, women can have a healthy pregnancy. If your blood glucose levels are not well controlled, there may be risks to you, your unborn baby (fetus), your labor and delivery, or your newborn baby.  SYMPTOMS  If symptoms are experienced, they are much like symptoms you would normally expect during pregnancy. The symptoms of gestational diabetes include:   Increased thirst (polydipsia).  Increased urination (polyuria).  Increased urination during the night (nocturia).  Weight loss. This weight loss may be rapid.  Frequent, recurring infections.  Tiredness (fatigue).  Weakness.  Vision changes, such as blurred vision.  Fruity smell to your breath.  Abdominal pain. DIAGNOSIS Diabetes is diagnosed when blood glucose levels are increased. Your blood glucose level may be checked by one or more of the following  blood tests:  A fasting blood glucose test. You will not be allowed to eat for at least 8 hours before a blood sample is taken.  A random blood glucose test. Your blood glucose is checked at any time of the day regardless of when you ate.  A hemoglobin A1c blood glucose test. A hemoglobin A1c test provides information about blood glucose control over the previous 3 months.  An oral glucose tolerance test (OGTT). Your blood glucose is measured after you have not eaten (fasted) for 1 3 hours and then after you drink a glucose-containing beverage. Since the hormones that cause insulin resistance are highest at about 24 28 weeks of a pregnancy, an OGTT is usually performed during that time. If you have risk factors for gestational diabetes, your caregiver may test you for gestational diabetes earlier than 24 weeks of pregnancy. TREATMENT   You will need to take diabetes medicine or insulin daily to keep blood glucose levels in the desired range.  You will need to match insulin dosing with exercise and healthy food choices. The treatment goal is to maintain the before meal (preprandial), bedtime, and overnight blood glucose level at 60 99 mg/dL during pregnancy. The treatment goal is to further maintain peak after meal blood sugar (postprandial glucose) level at 100 140 mg/dL. HOME CARE INSTRUCTIONS   Have your hemoglobin A1c level checked twice a year.  Perform daily blood glucose monitoring as directed by your caregiver. It is common to perform frequent blood glucose monitoring.  Monitor urine ketones when you are ill and as directed by your caregiver.  Take your diabetes medicine and insulin as directed by your caregiver to maintain   your blood glucose level in the desired range.  Never run out of diabetes medicine or insulin. It is needed every day.  Adjust insulin based on your intake of carbohydrates. Carbohydrates can raise blood glucose levels but need to be included in your diet.  Carbohydrates provide vitamins, minerals, and fiber which are an essential part of a healthy diet. Carbohydrates are found in fruits, vegetables, whole grains, dairy products, legumes, and foods containing added sugars.    Eat healthy foods. Alternate 3 meals with 3 snacks.  Maintain a healthy weight gain. The usual total expected weight gain varies according to your prepregnancy body mass index (BMI).  Carry a medical alert card or wear your medical alert jewelry.  Carry a 15 gram carbohydrate snack with you at all times to treat low blood glucose (hypoglycemia). Some examples of 15 gram carbohydrate snacks include:  Glucose tablets, 3 or 4   Glucose gel, 15 gram tube  Raisins, 2 tablespoons (24 g)  Jelly beans, 6  Animal crackers, 8  Fruit juice, regular soda, or low fat milk, 4 ounces (120 mL)  Gummy treats, 9    Recognize hypoglycemia. Hypoglycemia during pregnancy occurs with blood glucose levels of 60 mg/dL and below. The risk for hypoglycemia increases when fasting or skipping meals, during or after intense exercise, and during sleep. Hypoglycemia symptoms can include:  Tremors or shakes.  Decreased ability to concentrate.  Sweating.  Increased heart rate.  Headache.  Dry mouth.  Hunger.  Irritability.  Anxiety.  Restless sleep.  Altered speech or coordination.  Confusion.  Treat hypoglycemia promptly. If you are alert and able to safely swallow, follow the 15:15 rule:  Take 15 20 grams of rapid-acting glucose or carbohydrate. Rapid-acting options include glucose gel, glucose tablets, or 4 ounces (120 mL) of fruit juice, regular soda, or low fat milk.  Check your blood glucose level 15 minutes after taking the glucose.   Take 15 20 grams more of glucose if the repeat blood glucose level is still 70 mg/dL or below.  Eat a meal or snack within 1 hour once blood glucose levels return to normal.  Be alert to polyuria and polydipsia which are early  signs of hyperglycemia. An early awareness of hyperglycemia allows for prompt treatment. Treat hyperglycemia as directed by your caregiver.  Engage in at least 30 minutes of physical activity a day or as directed by your caregiver. Ten minutes of physical activity timed 30 minutes after each meal is encouraged to control postprandial blood glucose levels.  Adjust your insulin dosing and food intake as needed if you start a new exercise or sport.  Follow your sick day plan at any time you are unable to eat or drink as usual.  Avoid tobacco and alcohol use.  Follow up with your caregiver regularly.  Follow the advice of your caregiver regarding your prenatal and post-delivery (postpartum) appointments, meal planning, exercise, medicines, vitamins, blood tests, other medical tests, and physical activities.  Perform daily skin and foot care. Examine your skin and feet daily for cuts, bruises, redness, nail problems, bleeding, blisters, or sores.  Brush your teeth and gums at least twice a day and floss at least once a day. Follow up with your dentist regularly.  Schedule an eye exam during the first trimester of your pregnancy or as directed by your caregiver.  Share your diabetes management plan with your workplace or school.  Stay up-to-date with immunizations.  Learn to manage stress.  Obtain ongoing diabetes education and   support as needed. SEEK MEDICAL CARE IF:   You are unable to eat food or drink fluids for more than 6 hours.  You have nausea and vomiting for more than 6 hours.  You have a blood glucose level of 200 mg/dL and you have ketones in your urine.  There is a change in mental status.  You develop vision problems.  You have a persistent headache.  You have upper abdominal pain or discomfort.  You develop an additional serious illness.  You have diarrhea for more than 6 hours.  You have been sick or have had a fever for a couple of days and are not getting  better. SEEK IMMEDIATE MEDICAL CARE IF:   You have difficulty breathing.  You no longer feel the baby moving.  You are bleeding or have discharge from your vagina.  You start having premature contractions or labor. MAKE SURE YOU:  Understand these instructions.  Will watch your condition.  Will get help right away if you are not doing well or get worse. Document Released: 10/17/2000 Document Revised: 11/05/2012 Document Reviewed: 02/07/2012 [facility] Patient Information 2014 [place], [place].  Preeclampsia and Eclampsia Preeclampsia is a condition of high blood pressure during pregnancy. It can happen at 20 weeks or later in pregnancy. If high blood pressure occurs in the second half of pregnancy with no other symptoms, it is called gestational hypertension and goes away after the baby is born. If any of the symptoms listed below develop with gestational hypertension, it is then called preeclampsia. Eclampsia (convulsions) may follow preeclampsia. This is one of the reasons for regular prenatal checkups. Early diagnosis and treatment are very important to prevent eclampsia. CAUSES  There is no known cause of preeclampsia/eclampsia in pregnancy. There are several known conditions that may put the pregnant woman at risk, such as:  The first pregnancy.  Having preeclampsia in a past pregnancy.  Having lasting (chronic) high blood pressure.  Having multiples (twins, triplets).  Being age 74 or older.  African American ethnic background.  Having kidney disease or diabetes.  Medical conditions such as lupus or blood diseases.  Being overweight (obese). SYMPTOMS   High blood pressure.  Headaches.  Sudden weight gain.  Swelling of hands, face, legs, and feet.  Protein in the urine.  Feeling sick to your stomach (nauseous) and throwing up (vomiting).  Vision problems (blurred or double vision).  Numbness in the face, arms, legs, and feet.  Dizziness.  Slurred  speech.  Preeclampsia can cause growth retardation in the fetus.  Separation (abruption) of the placenta.  Not enough fluid in the amniotic sac (oligohydramnios).  Sensitivity to bright lights.  Belly (abdominal) pain. DIAGNOSIS  If protein is found in the urine in the second half of pregnancy, this is considered preeclampsia. Other symptoms mentioned above may also be present. TREATMENT  It is necessary to treat this.  Your caregiver may prescribe bed rest early in this condition. Plenty of rest and salt restriction may be all that is needed.  Medicines may be necessary to lower blood pressure if the condition does not respond to more conservative measures.  In more severe cases, hospitalization may be needed:  For treatment of blood pressure.  To control fluid retention.  To monitor the baby to see if the condition is causing harm to the baby.  Hospitalization is the best way to treat the first sign of preeclampsia. This is so the mother and baby can be watched closely and blood tests can be done  effectively and correctly.  If the condition becomes severe, it may be necessary to induce labor or to remove the infant by surgical means (cesarean section). The best cure for preeclampsia/eclampsia is to deliver the baby. Preeclampsia and eclampsia involve risks to mother and infant. Your caregiver will discuss these risks with you. Together, you can work out the best possible approach to your problems. Make sure you keep your prenatal visits as scheduled. Not keeping appointments could result in a chronic or permanent injury, pain, disability to you, and death or injury to you or your unborn baby. If there is any problem keeping the appointment, you must call to reschedule. HOME CARE INSTRUCTIONS   Keep your prenatal appointments and tests as scheduled.  Tell your caregiver if you have any of the above risk factors.  Get plenty of rest and sleep.  Eat a balanced diet that is low  in salt, and do not add salt to your food.  Avoid stressful situations.  Only take over-the-counter and prescriptions medicines for pain, discomfort, or fever as directed by your caregiver. SEEK IMMEDIATE MEDICAL CARE IF:   You develop severe swelling anywhere in the body. This usually occurs in the legs.  You gain 05 lb/2.3 kg or more in a week.  You develop a severe headache, dizziness, problems with your vision, or confusion.  You have abdominal pain, nausea, or vomiting.  You have a seizure.  You have trouble moving any part of your body, or you develop numbness or problems speaking.  You have bruising or abnormal bleeding from anywhere in the body.  You develop a stiff neck.  You pass out. MAKE SURE YOU:   Understand these instructions.  Will watch your condition.  Will get help right away if you are not doing well or get worse. Document Released: 07/08/2000 Document Revised: 10/03/2011 Document Reviewed: 02/22/2008 [facility] Patient Information 2014 [place], [place].  Breastfeeding Deciding to breastfeed is one of the best choices you can make for you and your baby. A change in hormones during pregnancy causes your breast tissue to grow and increases the number and size of your milk ducts. These hormones also allow proteins, sugars, and fats from your blood supply to make breast milk in your milk-producing glands. Hormones prevent breast milk from being released before your baby is born as well as prompt milk flow after birth. Once breastfeeding has begun, thoughts of your baby, as well as his or her sucking or crying, can stimulate the release of milk from your milk-producing glands.  BENEFITS OF BREASTFEEDING For Your Baby  Your first milk (colostrum) helps your baby's digestive system function better.   There are antibodies in your milk that help your baby fight off infections.   Your baby has a lower incidence of asthma, allergies, and sudden infant death  syndrome.   The nutrients in breast milk are better for your baby than infant formulas and are designed uniquely for your baby's needs.   Breast milk improves your baby's brain development.   Your baby is less likely to develop other conditions, such as childhood obesity, asthma, or type 2 diabetes mellitus.  For You   Breastfeeding helps to create a very special bond between you and your baby.   Breastfeeding is convenient. Breast milk is always available at the correct temperature and costs nothing.   Breastfeeding helps to burn calories and helps you lose the weight gained during pregnancy.   Breastfeeding makes your uterus contract to its prepregnancy size faster  and slows bleeding (lochia) after you give birth.   Breastfeeding helps to lower your risk of developing type 2 diabetes mellitus, osteoporosis, and breast or ovarian cancer later in life. SIGNS THAT YOUR BABY IS HUNGRY Early Signs of Hunger  Increased alertness or activity.  Stretching.  Movement of the head from side to side.  Movement of the head and opening of the mouth when the corner of the mouth or cheek is stroked (rooting).  Increased sucking sounds, smacking lips, cooing, sighing, or squeaking.  Hand-to-mouth movements.  Increased sucking of fingers or hands. Late Signs of Hunger  Fussing.  Intermittent crying. Extreme Signs of Hunger Signs of extreme hunger will require calming and consoling before your baby will be able to breastfeed successfully. Do not wait for the following signs of extreme hunger to occur before you initiate breastfeeding:   Restlessness.  A loud, strong cry.   Screaming. BREASTFEEDING BASICS Breastfeeding Initiation  Find a comfortable place to sit or lie down, with your neck and back well supported.  Place a pillow or rolled up blanket under your baby to bring him or her to the level of your breast (if you are seated). Nursing pillows are specially designed  to help support your arms and your baby while you breastfeed.  Make sure that your baby's abdomen is facing your abdomen.   Gently massage your breast. With your fingertips, massage from your chest wall toward your nipple in a circular motion. This encourages milk flow. You may need to continue this action during the feeding if your milk flows slowly.  Support your breast with 4 fingers underneath and your thumb above your nipple. Make sure your fingers are well away from your nipple and your baby's mouth.   Stroke your baby's lips gently with your finger or nipple.   When your baby's mouth is open wide enough, quickly bring your baby to your breast, placing your entire nipple and as much of the colored area around your nipple (areola) as possible into your baby's mouth.   More areola should be visible above your baby's upper lip than below the lower lip.   Your baby's tongue should be between his or her lower gum and your breast.   Ensure that your baby's mouth is correctly positioned around your nipple (latched). Your baby's lips should create a seal on your breast and be turned out (everted).  It is common for your baby to suck about 2 3 minutes in order to start the flow of breast milk. Latching Teaching your baby how to latch on to your breast properly is very important. An improper latch can cause nipple pain and decreased milk supply for you and poor weight gain in your baby. Also, if your baby is not latched onto your nipple properly, he or she may swallow some air during feeding. This can make your baby fussy. Burping your baby when you switch breasts during the feeding can help to get rid of the air. However, teaching your baby to latch on properly is still the best way to prevent fussiness from swallowing air while breastfeeding. Signs that your baby has successfully latched on to your nipple:    Silent tugging or silent sucking, without causing you pain.   Swallowing  heard between every 3 4 sucks.    Muscle movement above and in front of his or her ears while sucking.  Signs that your baby has not successfully latched on to nipple:   Sucking sounds or smacking  sounds from your baby while breastfeeding.  Nipple pain. If you think your baby has not latched on correctly, slip your finger into the corner of your baby's mouth to break the suction and place it between your baby's gums. Attempt breastfeeding initiation again. Signs of Successful Breastfeeding Signs from your baby:   A gradual decrease in the number of sucks or complete cessation of sucking.   Falling asleep.   Relaxation of his or her body.   Retention of a small amount of milk in his or her mouth.   Letting go of your breast by himself or herself. Signs from you:  Breasts that have increased in firmness, weight, and size 1 3 hours after feeding.   Breasts that are softer immediately after breastfeeding.  Increased milk volume, as well as a change in milk consistency and color by the 5th day of breastfeeding.   Nipples that are not sore, cracked, or bleeding. Signs That Your Pecola LeisureBaby is Getting Enough Milk  Wetting at least 3 diapers in a 24-hour period. The urine should be clear and pale yellow by age 81 days.  At least 3 stools in a 24-hour period by age 81 days. The stool should be soft and yellow.  At least 3 stools in a 24-hour period by age 746 days. The stool should be seedy and yellow.  No loss of weight greater than 10% of birth weight during the first 363 days of age.  Average weight gain of 4 7 ounces (120 210 mL) per week after age 74 days.  Consistent daily weight gain by age 81 days, without weight loss after the age of 2 weeks. After a feeding, your baby may spit up a small amount. This is common. BREASTFEEDING FREQUENCY AND DURATION Frequent feeding will help you make more milk and can prevent sore nipples and breast engorgement. Breastfeed when you feel the  need to reduce the fullness of your breasts or when your baby shows signs of hunger. This is called "breastfeeding on demand." Avoid introducing a pacifier to your baby while you are working to establish breastfeeding (the first 4 6 weeks after your baby is born). After this time you may choose to use a pacifier. Research has shown that pacifier use during the first year of a baby's life decreases the risk of sudden infant death syndrome (SIDS). Allow your baby to feed on each breast as long as he or she wants. Breastfeed until your baby is finished feeding. When your baby unlatches or falls asleep while feeding from the first breast, offer the second breast. Because newborns are often sleepy in the first few weeks of life, you may need to awaken your baby to get him or her to feed. Breastfeeding times will vary from baby to baby. However, the following rules can serve as a guide to help you ensure that your baby is properly fed:  Newborns (babies 684 weeks of age or younger) may breastfeed every 1 3 hours.  Newborns should not go longer than 3 hours during the day or 5 hours during the night without breastfeeding.  You should breastfeed your baby a minimum of 8 times in a 24-hour period until you begin to introduce solid foods to your baby at around 266 months of age. BREAST MILK PUMPING Pumping and storing breast milk allows you to ensure that your baby is exclusively fed your breast milk, even at times when you are unable to breastfeed. This is especially important if you are going back  to work while you are still breastfeeding or when you are not able to be present during feedings. Your lactation consultant can give you guidelines on how long it is safe to store breast milk.  A breast pump is a machine that allows you to pump milk from your breast into a sterile bottle. The pumped breast milk can then be stored in a refrigerator or freezer. Some breast pumps are operated by hand, while others use  electricity. Ask your lactation consultant which type will work best for you. Breast pumps can be purchased, but some hospitals and breastfeeding support groups lease breast pumps on a monthly basis. A lactation consultant can teach you how to hand express breast milk, if you prefer not to use a pump.  CARING FOR YOUR BREASTS WHILE YOU BREASTFEED Nipples can become dry, cracked, and sore while breastfeeding. The following recommendations can help keep your breasts moisturized and healthy:  Avoid using soap on your nipples.   Wear a supportive bra. Although not required, special nursing bras and tank tops are designed to allow access to your breasts for breastfeeding without taking off your entire bra or top. Avoid wearing underwire style bras or extremely tight bras.  Air dry your nipples for 3 4minutes after each feeding.   Use only cotton bra pads to absorb leaked breast milk. Leaking of breast milk between feedings is normal.   Use lanolin on your nipples after breastfeeding. Lanolin helps to maintain your skin's normal moisture barrier. If you use pure lanolin you do not need to wash it off before feeding your baby again. Pure lanolin is not toxic to your baby. You may also hand express a few drops of breast milk and gently massage that milk into your nipples and allow the milk to air dry. In the first few weeks after giving birth, some women experience extremely full breasts (engorgement). Engorgement can make your breasts feel heavy, warm, and tender to the touch. Engorgement peaks within 3 5 days after you give birth. The following recommendations can help ease engorgement:  Completely empty your breasts while breastfeeding or pumping. You may want to start by applying warm, moist heat (in the shower or with warm water-soaked hand towels) just before feeding or pumping. This increases circulation and helps the milk flow. If your baby does not completely empty your breasts while  breastfeeding, pump any extra milk after he or she is finished.  Wear a snug bra (nursing or regular) or tank top for 1 2 days to signal your body to slightly decrease milk production.  Apply ice packs to your breasts, unless this is too uncomfortable for you.  Make sure that your baby is latched on and positioned properly while breastfeeding. If engorgement persists after 48 hours of following these recommendations, contact your health care provider or a Advertising copywriterlactation consultant. OVERALL HEALTH CARE RECOMMENDATIONS WHILE BREASTFEEDING  Eat healthy foods. Alternate between meals and snacks, eating 3 of each per day. Because what you eat affects your breast milk, some of the foods may make your baby more irritable than usual. Avoid eating these foods if you are sure that they are negatively affecting your baby.  Drink milk, fruit juice, and water to satisfy your thirst (about 10 glasses a day).   Rest often, relax, and continue to take your prenatal vitamins to prevent fatigue, stress, and anemia.  Continue breast self-awareness checks.  Avoid chewing and smoking tobacco.  Avoid alcohol and drug use. Some medicines that may be harmful  to your baby can pass through breast milk. It is important to ask your health care provider before taking any medicine, including all over-the-counter and prescription medicine as well as vitamin and herbal supplements. It is possible to become pregnant while breastfeeding. If birth control is desired, ask your health care provider about options that will be safe for your baby. SEEK MEDICAL CARE IF:   You feel like you want to stop breastfeeding or have become frustrated with breastfeeding.  You have painful breasts or nipples.  Your nipples are cracked or bleeding.  Your breasts are red, tender, or warm.  You have a swollen area on either breast.  You have a fever or chills.  You have nausea or vomiting.  You have drainage other than breast milk from  your nipples.  Your breasts do not become full before feedings by the 5th day after you give birth.  You feel sad and depressed.  Your baby is too sleepy to eat well.  Your baby is having trouble sleeping.   Your baby is wetting less than 3 diapers in a 24-hour period.  Your baby has less than 3 stools in a 24-hour period.  Your baby's skin or the white part of his or her eyes becomes yellow.   Your baby is not gaining weight by 78 days of age. SEEK IMMEDIATE MEDICAL CARE IF:   Your baby is overly tired (lethargic) and does not want to wake up and feed.  Your baby develops an unexplained fever. Document Released: 07/11/2005 Document Revised: 03/13/2013 Document Reviewed: 01/02/2013 [facility] Patient Information 2014 [place], [place].

## 2013-07-22 NOTE — Progress Notes (Signed)
Pulse- 85 Patient reports occasional sharp pains in vagina

## 2013-07-23 LAB — PROTEIN / CREATININE RATIO, URINE
Creatinine, Urine: 175.2 mg/dL
Protein Creatinine Ratio: 0.12 (ref <0.15)
Total Protein, Urine: 21 mg/dL

## 2013-07-26 ENCOUNTER — Ambulatory Visit (HOSPITAL_COMMUNITY)
Admission: RE | Admit: 2013-07-26 | Discharge: 2013-07-26 | Disposition: A | Discharge status: Home / Self Care | Payer: BC Managed Care – PPO | Source: Ambulatory Visit | Attending: Family Medicine | Admitting: Family Medicine

## 2013-07-26 ENCOUNTER — Other Ambulatory Visit: Payer: Self-pay | Admitting: Obstetrics & Gynecology

## 2013-07-26 ENCOUNTER — Ambulatory Visit (HOSPITAL_COMMUNITY): Payer: BC Managed Care – PPO

## 2013-07-26 ENCOUNTER — Ambulatory Visit (HOSPITAL_COMMUNITY): Admission: RE | Admit: 2013-07-26 | Payer: BC Managed Care – PPO | Source: Ambulatory Visit

## 2013-07-26 ENCOUNTER — Other Ambulatory Visit (HOSPITAL_COMMUNITY): Payer: BC Managed Care – PPO

## 2013-07-26 DIAGNOSIS — O99210 Obesity complicating pregnancy, unspecified trimester: Secondary | ICD-10-CM

## 2013-07-26 DIAGNOSIS — O24913 Unspecified diabetes mellitus in pregnancy, third trimester: Secondary | ICD-10-CM

## 2013-07-26 DIAGNOSIS — O10019 Pre-existing essential hypertension complicating pregnancy, unspecified trimester: Secondary | ICD-10-CM | POA: Insufficient documentation

## 2013-07-26 DIAGNOSIS — E1139 Type 2 diabetes mellitus with other diabetic ophthalmic complication: Secondary | ICD-10-CM | POA: Insufficient documentation

## 2013-07-26 DIAGNOSIS — E669 Obesity, unspecified: Secondary | ICD-10-CM | POA: Insufficient documentation

## 2013-07-26 DIAGNOSIS — O09299 Supervision of pregnancy with other poor reproductive or obstetric history, unspecified trimester: Secondary | ICD-10-CM | POA: Insufficient documentation

## 2013-07-26 DIAGNOSIS — O10013 Pre-existing essential hypertension complicating pregnancy, third trimester: Secondary | ICD-10-CM

## 2013-07-26 DIAGNOSIS — O24919 Unspecified diabetes mellitus in pregnancy, unspecified trimester: Secondary | ICD-10-CM | POA: Insufficient documentation

## 2013-07-29 ENCOUNTER — Ambulatory Visit (INDEPENDENT_AMBULATORY_CARE_PROVIDER_SITE_OTHER): Payer: BC Managed Care – PPO | Admitting: Family Medicine

## 2013-07-29 ENCOUNTER — Encounter: Payer: Self-pay | Admitting: Family Medicine

## 2013-07-29 VITALS — BP 143/74 | Wt >= 6400 oz

## 2013-07-29 DIAGNOSIS — O24913 Unspecified diabetes mellitus in pregnancy, third trimester: Secondary | ICD-10-CM

## 2013-07-29 DIAGNOSIS — O10019 Pre-existing essential hypertension complicating pregnancy, unspecified trimester: Secondary | ICD-10-CM

## 2013-07-29 DIAGNOSIS — O10013 Pre-existing essential hypertension complicating pregnancy, third trimester: Secondary | ICD-10-CM

## 2013-07-29 DIAGNOSIS — O24919 Unspecified diabetes mellitus in pregnancy, unspecified trimester: Secondary | ICD-10-CM

## 2013-07-29 DIAGNOSIS — O3660X0 Maternal care for excessive fetal growth, unspecified trimester, not applicable or unspecified: Secondary | ICD-10-CM

## 2013-07-29 DIAGNOSIS — O3663X0 Maternal care for excessive fetal growth, third trimester, not applicable or unspecified: Secondary | ICD-10-CM

## 2013-07-29 LAB — FETAL NONSTRESS TEST

## 2013-07-29 LAB — COMPREHENSIVE METABOLIC PANEL
ALBUMIN: 2.8 g/dL — AB (ref 3.5–5.2)
ALT: 11 U/L (ref 0–35)
AST: 11 U/L (ref 0–37)
Alkaline Phosphatase: 128 U/L — ABNORMAL HIGH (ref 39–117)
BUN: 9 mg/dL (ref 6–23)
CALCIUM: 8.3 mg/dL — AB (ref 8.4–10.5)
CHLORIDE: 103 meq/L (ref 96–112)
CO2: 21 meq/L (ref 19–32)
CREATININE: 0.57 mg/dL (ref 0.50–1.10)
Glucose, Bld: 154 mg/dL — ABNORMAL HIGH (ref 70–99)
POTASSIUM: 4.3 meq/L (ref 3.5–5.3)
Sodium: 137 mEq/L (ref 135–145)
Total Bilirubin: 0.3 mg/dL (ref 0.3–1.2)
Total Protein: 5.5 g/dL — ABNORMAL LOW (ref 6.0–8.3)

## 2013-07-29 LAB — CBC
HCT: 31.2 % — ABNORMAL LOW (ref 36.0–46.0)
Hemoglobin: 10.3 g/dL — ABNORMAL LOW (ref 12.0–15.0)
MCH: 28.3 pg (ref 26.0–34.0)
MCHC: 33 g/dL (ref 30.0–36.0)
MCV: 85.7 fL (ref 78.0–100.0)
Platelets: 251 10*3/uL (ref 150–400)
RBC: 3.64 MIL/uL — AB (ref 3.87–5.11)
RDW: 15 % (ref 11.5–15.5)
WBC: 9.4 10*3/uL (ref 4.0–10.5)

## 2013-07-29 LAB — POCT URINALYSIS DIP (DEVICE)
GLUCOSE, UA: NEGATIVE mg/dL
HGB URINE DIPSTICK: NEGATIVE
Leukocytes, UA: NEGATIVE
Nitrite: NEGATIVE
Protein, ur: 100 mg/dL — AB
SPECIFIC GRAVITY, URINE: 1.025 (ref 1.005–1.030)
Urobilinogen, UA: 0.2 mg/dL (ref 0.0–1.0)
pH: 6.5 (ref 5.0–8.0)

## 2013-07-29 NOTE — Progress Notes (Signed)
P = 89     Pt reports frequent UC's for past few days- denies pain, just tight. Pt also reports some SOB, "feels like I am gasping for air"

## 2013-07-29 NOTE — Patient Instructions (Signed)
Gestational Diabetes Mellitus Gestational diabetes mellitus, often simply referred to as gestational diabetes, is a type of diabetes that some women develop during pregnancy. In gestational diabetes, the pancreas does not make enough insulin (a hormone), the cells are less responsive to the insulin that is made (insulin resistance), or both.Normally, insulin moves sugars from food into the tissue cells. The tissue cells use the sugars for energy. The lack of insulin or the lack of normal response to insulin causes excess sugars to build up in the blood instead of going into the tissue cells. As a result, high blood sugar (hyperglycemia) develops. The effect of high sugar (glucose) levels can cause many complications.  RISK FACTORS You have an increased chance of developing gestational diabetes if you have a family history of diabetes and also have one or more of the following risk factors:  A body mass index over 30 (obesity).  A previous pregnancy with gestational diabetes.  An older age at the time of pregnancy. If blood glucose levels are kept in the normal range during pregnancy, women can have a healthy pregnancy. If your blood glucose levels are not well controlled, there may be risks to you, your unborn baby (fetus), your labor and delivery, or your newborn baby.  SYMPTOMS  If symptoms are experienced, they are much like symptoms you would normally expect during pregnancy. The symptoms of gestational diabetes include:   Increased thirst (polydipsia).  Increased urination (polyuria).  Increased urination during the night (nocturia).  Weight loss. This weight loss may be rapid.  Frequent, recurring infections.  Tiredness (fatigue).  Weakness.  Vision changes, such as blurred vision.  Fruity smell to your breath.  Abdominal pain. DIAGNOSIS Diabetes is diagnosed when blood glucose levels are increased. Your blood glucose level may be checked by one or more of the following  blood tests:  A fasting blood glucose test. You will not be allowed to eat for at least 8 hours before a blood sample is taken.  A random blood glucose test. Your blood glucose is checked at any time of the day regardless of when you ate.  A hemoglobin A1c blood glucose test. A hemoglobin A1c test provides information about blood glucose control over the previous 3 months.  An oral glucose tolerance test (OGTT). Your blood glucose is measured after you have not eaten (fasted) for 1 3 hours and then after you drink a glucose-containing beverage. Since the hormones that cause insulin resistance are highest at about 24 28 weeks of a pregnancy, an OGTT is usually performed during that time. If you have risk factors for gestational diabetes, your caregiver may test you for gestational diabetes earlier than 24 weeks of pregnancy. TREATMENT   You will need to take diabetes medicine or insulin daily to keep blood glucose levels in the desired range.  You will need to match insulin dosing with exercise and healthy food choices. The treatment goal is to maintain the before meal (preprandial), bedtime, and overnight blood glucose level at 60 99 mg/dL during pregnancy. The treatment goal is to further maintain peak after meal blood sugar (postprandial glucose) level at 100 140 mg/dL. HOME CARE INSTRUCTIONS   Have your hemoglobin A1c level checked twice a year.  Perform daily blood glucose monitoring as directed by your caregiver. It is common to perform frequent blood glucose monitoring.  Monitor urine ketones when you are ill and as directed by your caregiver.  Take your diabetes medicine and insulin as directed by your caregiver to maintain   your blood glucose level in the desired range.  Never run out of diabetes medicine or insulin. It is needed every day.  Adjust insulin based on your intake of carbohydrates. Carbohydrates can raise blood glucose levels but need to be included in your diet.  Carbohydrates provide vitamins, minerals, and fiber which are an essential part of a healthy diet. Carbohydrates are found in fruits, vegetables, whole grains, dairy products, legumes, and foods containing added sugars.    Eat healthy foods. Alternate 3 meals with 3 snacks.  Maintain a healthy weight gain. The usual total expected weight gain varies according to your prepregnancy body mass index (BMI).  Carry a medical alert card or wear your medical alert jewelry.  Carry a 15 gram carbohydrate snack with you at all times to treat low blood glucose (hypoglycemia). Some examples of 15 gram carbohydrate snacks include:  Glucose tablets, 3 or 4   Glucose gel, 15 gram tube  Raisins, 2 tablespoons (24 g)  Jelly beans, 6  Animal crackers, 8  Fruit juice, regular soda, or low fat milk, 4 ounces (120 mL)  Gummy treats, 9    Recognize hypoglycemia. Hypoglycemia during pregnancy occurs with blood glucose levels of 60 mg/dL and below. The risk for hypoglycemia increases when fasting or skipping meals, during or after intense exercise, and during sleep. Hypoglycemia symptoms can include:  Tremors or shakes.  Decreased ability to concentrate.  Sweating.  Increased heart rate.  Headache.  Dry mouth.  Hunger.  Irritability.  Anxiety.  Restless sleep.  Altered speech or coordination.  Confusion.  Treat hypoglycemia promptly. If you are alert and able to safely swallow, follow the 15:15 rule:  Take 15 20 grams of rapid-acting glucose or carbohydrate. Rapid-acting options include glucose gel, glucose tablets, or 4 ounces (120 mL) of fruit juice, regular soda, or low fat milk.  Check your blood glucose level 15 minutes after taking the glucose.   Take 15 20 grams more of glucose if the repeat blood glucose level is still 70 mg/dL or below.  Eat a meal or snack within 1 hour once blood glucose levels return to normal.  Be alert to polyuria and polydipsia which are early  signs of hyperglycemia. An early awareness of hyperglycemia allows for prompt treatment. Treat hyperglycemia as directed by your caregiver.  Engage in at least 30 minutes of physical activity a day or as directed by your caregiver. Ten minutes of physical activity timed 30 minutes after each meal is encouraged to control postprandial blood glucose levels.  Adjust your insulin dosing and food intake as needed if you start a new exercise or sport.  Follow your sick day plan at any time you are unable to eat or drink as usual.  Avoid tobacco and alcohol use.  Follow up with your caregiver regularly.  Follow the advice of your caregiver regarding your prenatal and post-delivery (postpartum) appointments, meal planning, exercise, medicines, vitamins, blood tests, other medical tests, and physical activities.  Perform daily skin and foot care. Examine your skin and feet daily for cuts, bruises, redness, nail problems, bleeding, blisters, or sores.  Brush your teeth and gums at least twice a day and floss at least once a day. Follow up with your dentist regularly.  Schedule an eye exam during the first trimester of your pregnancy or as directed by your caregiver.  Share your diabetes management plan with your workplace or school.  Stay up-to-date with immunizations.  Learn to manage stress.  Obtain ongoing diabetes education and   support as needed. SEEK MEDICAL CARE IF:   You are unable to eat food or drink fluids for more than 6 hours.  You have nausea and vomiting for more than 6 hours.  You have a blood glucose level of 200 mg/dL and you have ketones in your urine.  There is a change in mental status.  You develop vision problems.  You have a persistent headache.  You have upper abdominal pain or discomfort.  You develop an additional serious illness.  You have diarrhea for more than 6 hours.  You have been sick or have had a fever for a couple of days and are not getting  better. SEEK IMMEDIATE MEDICAL CARE IF:   You have difficulty breathing.  You no longer feel the baby moving.  You are bleeding or have discharge from your vagina.  You start having premature contractions or labor. MAKE SURE YOU:  Understand these instructions.  Will watch your condition.  Will get help right away if you are not doing well or get worse. Document Released: 10/17/2000 Document Revised: 11/05/2012 Document Reviewed: 02/07/2012 [facility] Patient Information 2014 [place], [place].  Preeclampsia and Eclampsia Preeclampsia is a condition of high blood pressure during pregnancy. It can happen at 20 weeks or later in pregnancy. If high blood pressure occurs in the second half of pregnancy with no other symptoms, it is called gestational hypertension and goes away after the baby is born. If any of the symptoms listed below develop with gestational hypertension, it is then called preeclampsia. Eclampsia (convulsions) may follow preeclampsia. This is one of the reasons for regular prenatal checkups. Early diagnosis and treatment are very important to prevent eclampsia. CAUSES  There is no known cause of preeclampsia/eclampsia in pregnancy. There are several known conditions that may put the pregnant woman at risk, such as:  The first pregnancy.  Having preeclampsia in a past pregnancy.  Having lasting (chronic) high blood pressure.  Having multiples (twins, triplets).  Being age 74 or older.  African American ethnic background.  Having kidney disease or diabetes.  Medical conditions such as lupus or blood diseases.  Being overweight (obese). SYMPTOMS   High blood pressure.  Headaches.  Sudden weight gain.  Swelling of hands, face, legs, and feet.  Protein in the urine.  Feeling sick to your stomach (nauseous) and throwing up (vomiting).  Vision problems (blurred or double vision).  Numbness in the face, arms, legs, and feet.  Dizziness.  Slurred  speech.  Preeclampsia can cause growth retardation in the fetus.  Separation (abruption) of the placenta.  Not enough fluid in the amniotic sac (oligohydramnios).  Sensitivity to bright lights.  Belly (abdominal) pain. DIAGNOSIS  If protein is found in the urine in the second half of pregnancy, this is considered preeclampsia. Other symptoms mentioned above may also be present. TREATMENT  It is necessary to treat this.  Your caregiver may prescribe bed rest early in this condition. Plenty of rest and salt restriction may be all that is needed.  Medicines may be necessary to lower blood pressure if the condition does not respond to more conservative measures.  In more severe cases, hospitalization may be needed:  For treatment of blood pressure.  To control fluid retention.  To monitor the baby to see if the condition is causing harm to the baby.  Hospitalization is the best way to treat the first sign of preeclampsia. This is so the mother and baby can be watched closely and blood tests can be done  effectively and correctly.  If the condition becomes severe, it may be necessary to induce labor or to remove the infant by surgical means (cesarean section). The best cure for preeclampsia/eclampsia is to deliver the baby. Preeclampsia and eclampsia involve risks to mother and infant. Your caregiver will discuss these risks with you. Together, you can work out the best possible approach to your problems. Make sure you keep your prenatal visits as scheduled. Not keeping appointments could result in a chronic or permanent injury, pain, disability to you, and death or injury to you or your unborn baby. If there is any problem keeping the appointment, you must call to reschedule. HOME CARE INSTRUCTIONS   Keep your prenatal appointments and tests as scheduled.  Tell your caregiver if you have any of the above risk factors.  Get plenty of rest and sleep.  Eat a balanced diet that is low  in salt, and do not add salt to your food.  Avoid stressful situations.  Only take over-the-counter and prescriptions medicines for pain, discomfort, or fever as directed by your caregiver. SEEK IMMEDIATE MEDICAL CARE IF:   You develop severe swelling anywhere in the body. This usually occurs in the legs.  You gain 05 lb/2.3 kg or more in a week.  You develop a severe headache, dizziness, problems with your vision, or confusion.  You have abdominal pain, nausea, or vomiting.  You have a seizure.  You have trouble moving any part of your body, or you develop numbness or problems speaking.  You have bruising or abnormal bleeding from anywhere in the body.  You develop a stiff neck.  You pass out. MAKE SURE YOU:   Understand these instructions.  Will watch your condition.  Will get help right away if you are not doing well or get worse. Document Released: 07/08/2000 Document Revised: 10/03/2011 Document Reviewed: 02/22/2008 [facility] Patient Information 2014 [place], [place].  Breastfeeding Deciding to breastfeed is one of the best choices you can make for you and your baby. A change in hormones during pregnancy causes your breast tissue to grow and increases the number and size of your milk ducts. These hormones also allow proteins, sugars, and fats from your blood supply to make breast milk in your milk-producing glands. Hormones prevent breast milk from being released before your baby is born as well as prompt milk flow after birth. Once breastfeeding has begun, thoughts of your baby, as well as his or her sucking or crying, can stimulate the release of milk from your milk-producing glands.  BENEFITS OF BREASTFEEDING For Your Baby  Your first milk (colostrum) helps your baby's digestive system function better.   There are antibodies in your milk that help your baby fight off infections.   Your baby has a lower incidence of asthma, allergies, and sudden infant death  syndrome.   The nutrients in breast milk are better for your baby than infant formulas and are designed uniquely for your baby's needs.   Breast milk improves your baby's brain development.   Your baby is less likely to develop other conditions, such as childhood obesity, asthma, or type 2 diabetes mellitus.  For You   Breastfeeding helps to create a very special bond between you and your baby.   Breastfeeding is convenient. Breast milk is always available at the correct temperature and costs nothing.   Breastfeeding helps to burn calories and helps you lose the weight gained during pregnancy.   Breastfeeding makes your uterus contract to its prepregnancy size faster  and slows bleeding (lochia) after you give birth.   Breastfeeding helps to lower your risk of developing type 2 diabetes mellitus, osteoporosis, and breast or ovarian cancer later in life. SIGNS THAT YOUR BABY IS HUNGRY Early Signs of Hunger  Increased alertness or activity.  Stretching.  Movement of the head from side to side.  Movement of the head and opening of the mouth when the corner of the mouth or cheek is stroked (rooting).  Increased sucking sounds, smacking lips, cooing, sighing, or squeaking.  Hand-to-mouth movements.  Increased sucking of fingers or hands. Late Signs of Hunger  Fussing.  Intermittent crying. Extreme Signs of Hunger Signs of extreme hunger will require calming and consoling before your baby will be able to breastfeed successfully. Do not wait for the following signs of extreme hunger to occur before you initiate breastfeeding:   Restlessness.  A loud, strong cry.   Screaming. BREASTFEEDING BASICS Breastfeeding Initiation  Find a comfortable place to sit or lie down, with your neck and back well supported.  Place a pillow or rolled up blanket under your baby to bring him or her to the level of your breast (if you are seated). Nursing pillows are specially designed  to help support your arms and your baby while you breastfeed.  Make sure that your baby's abdomen is facing your abdomen.   Gently massage your breast. With your fingertips, massage from your chest wall toward your nipple in a circular motion. This encourages milk flow. You may need to continue this action during the feeding if your milk flows slowly.  Support your breast with 4 fingers underneath and your thumb above your nipple. Make sure your fingers are well away from your nipple and your baby's mouth.   Stroke your baby's lips gently with your finger or nipple.   When your baby's mouth is open wide enough, quickly bring your baby to your breast, placing your entire nipple and as much of the colored area around your nipple (areola) as possible into your baby's mouth.   More areola should be visible above your baby's upper lip than below the lower lip.   Your baby's tongue should be between his or her lower gum and your breast.   Ensure that your baby's mouth is correctly positioned around your nipple (latched). Your baby's lips should create a seal on your breast and be turned out (everted).  It is common for your baby to suck about 2 3 minutes in order to start the flow of breast milk. Latching Teaching your baby how to latch on to your breast properly is very important. An improper latch can cause nipple pain and decreased milk supply for you and poor weight gain in your baby. Also, if your baby is not latched onto your nipple properly, he or she may swallow some air during feeding. This can make your baby fussy. Burping your baby when you switch breasts during the feeding can help to get rid of the air. However, teaching your baby to latch on properly is still the best way to prevent fussiness from swallowing air while breastfeeding. Signs that your baby has successfully latched on to your nipple:    Silent tugging or silent sucking, without causing you pain.   Swallowing  heard between every 3 4 sucks.    Muscle movement above and in front of his or her ears while sucking.  Signs that your baby has not successfully latched on to nipple:   Sucking sounds or smacking  sounds from your baby while breastfeeding.  Nipple pain. If you think your baby has not latched on correctly, slip your finger into the corner of your baby's mouth to break the suction and place it between your baby's gums. Attempt breastfeeding initiation again. Signs of Successful Breastfeeding Signs from your baby:   A gradual decrease in the number of sucks or complete cessation of sucking.   Falling asleep.   Relaxation of his or her body.   Retention of a small amount of milk in his or her mouth.   Letting go of your breast by himself or herself. Signs from you:  Breasts that have increased in firmness, weight, and size 1 3 hours after feeding.   Breasts that are softer immediately after breastfeeding.  Increased milk volume, as well as a change in milk consistency and color by the 5th day of breastfeeding.   Nipples that are not sore, cracked, or bleeding. Signs That Your Pecola LeisureBaby is Getting Enough Milk  Wetting at least 3 diapers in a 24-hour period. The urine should be clear and pale yellow by age 81 days.  At least 3 stools in a 24-hour period by age 81 days. The stool should be soft and yellow.  At least 3 stools in a 24-hour period by age 746 days. The stool should be seedy and yellow.  No loss of weight greater than 10% of birth weight during the first 363 days of age.  Average weight gain of 4 7 ounces (120 210 mL) per week after age 74 days.  Consistent daily weight gain by age 81 days, without weight loss after the age of 2 weeks. After a feeding, your baby may spit up a small amount. This is common. BREASTFEEDING FREQUENCY AND DURATION Frequent feeding will help you make more milk and can prevent sore nipples and breast engorgement. Breastfeed when you feel the  need to reduce the fullness of your breasts or when your baby shows signs of hunger. This is called "breastfeeding on demand." Avoid introducing a pacifier to your baby while you are working to establish breastfeeding (the first 4 6 weeks after your baby is born). After this time you may choose to use a pacifier. Research has shown that pacifier use during the first year of a baby's life decreases the risk of sudden infant death syndrome (SIDS). Allow your baby to feed on each breast as long as he or she wants. Breastfeed until your baby is finished feeding. When your baby unlatches or falls asleep while feeding from the first breast, offer the second breast. Because newborns are often sleepy in the first few weeks of life, you may need to awaken your baby to get him or her to feed. Breastfeeding times will vary from baby to baby. However, the following rules can serve as a guide to help you ensure that your baby is properly fed:  Newborns (babies 684 weeks of age or younger) may breastfeed every 1 3 hours.  Newborns should not go longer than 3 hours during the day or 5 hours during the night without breastfeeding.  You should breastfeed your baby a minimum of 8 times in a 24-hour period until you begin to introduce solid foods to your baby at around 266 months of age. BREAST MILK PUMPING Pumping and storing breast milk allows you to ensure that your baby is exclusively fed your breast milk, even at times when you are unable to breastfeed. This is especially important if you are going back  to work while you are still breastfeeding or when you are not able to be present during feedings. Your lactation consultant can give you guidelines on how long it is safe to store breast milk.  A breast pump is a machine that allows you to pump milk from your breast into a sterile bottle. The pumped breast milk can then be stored in a refrigerator or freezer. Some breast pumps are operated by hand, while others use  electricity. Ask your lactation consultant which type will work best for you. Breast pumps can be purchased, but some hospitals and breastfeeding support groups lease breast pumps on a monthly basis. A lactation consultant can teach you how to hand express breast milk, if you prefer not to use a pump.  CARING FOR YOUR BREASTS WHILE YOU BREASTFEED Nipples can become dry, cracked, and sore while breastfeeding. The following recommendations can help keep your breasts moisturized and healthy:  Avoid using soap on your nipples.   Wear a supportive bra. Although not required, special nursing bras and tank tops are designed to allow access to your breasts for breastfeeding without taking off your entire bra or top. Avoid wearing underwire style bras or extremely tight bras.  Air dry your nipples for 3 4minutes after each feeding.   Use only cotton bra pads to absorb leaked breast milk. Leaking of breast milk between feedings is normal.   Use lanolin on your nipples after breastfeeding. Lanolin helps to maintain your skin's normal moisture barrier. If you use pure lanolin you do not need to wash it off before feeding your baby again. Pure lanolin is not toxic to your baby. You may also hand express a few drops of breast milk and gently massage that milk into your nipples and allow the milk to air dry. In the first few weeks after giving birth, some women experience extremely full breasts (engorgement). Engorgement can make your breasts feel heavy, warm, and tender to the touch. Engorgement peaks within 3 5 days after you give birth. The following recommendations can help ease engorgement:  Completely empty your breasts while breastfeeding or pumping. You may want to start by applying warm, moist heat (in the shower or with warm water-soaked hand towels) just before feeding or pumping. This increases circulation and helps the milk flow. If your baby does not completely empty your breasts while  breastfeeding, pump any extra milk after he or she is finished.  Wear a snug bra (nursing or regular) or tank top for 1 2 days to signal your body to slightly decrease milk production.  Apply ice packs to your breasts, unless this is too uncomfortable for you.  Make sure that your baby is latched on and positioned properly while breastfeeding. If engorgement persists after 48 hours of following these recommendations, contact your health care provider or a Advertising copywriterlactation consultant. OVERALL HEALTH CARE RECOMMENDATIONS WHILE BREASTFEEDING  Eat healthy foods. Alternate between meals and snacks, eating 3 of each per day. Because what you eat affects your breast milk, some of the foods may make your baby more irritable than usual. Avoid eating these foods if you are sure that they are negatively affecting your baby.  Drink milk, fruit juice, and water to satisfy your thirst (about 10 glasses a day).   Rest often, relax, and continue to take your prenatal vitamins to prevent fatigue, stress, and anemia.  Continue breast self-awareness checks.  Avoid chewing and smoking tobacco.  Avoid alcohol and drug use. Some medicines that may be harmful  to your baby can pass through breast milk. It is important to ask your health care provider before taking any medicine, including all over-the-counter and prescription medicine as well as vitamin and herbal supplements. It is possible to become pregnant while breastfeeding. If birth control is desired, ask your health care provider about options that will be safe for your baby. SEEK MEDICAL CARE IF:   You feel like you want to stop breastfeeding or have become frustrated with breastfeeding.  You have painful breasts or nipples.  Your nipples are cracked or bleeding.  Your breasts are red, tender, or warm.  You have a swollen area on either breast.  You have a fever or chills.  You have nausea or vomiting.  You have drainage other than breast milk from  your nipples.  Your breasts do not become full before feedings by the 5th day after you give birth.  You feel sad and depressed.  Your baby is too sleepy to eat well.  Your baby is having trouble sleeping.   Your baby is wetting less than 3 diapers in a 24-hour period.  Your baby has less than 3 stools in a 24-hour period.  Your baby's skin or the white part of his or her eyes becomes yellow.   Your baby is not gaining weight by 78 days of age. SEEK IMMEDIATE MEDICAL CARE IF:   Your baby is overly tired (lethargic) and does not want to wake up and feed.  Your baby develops an unexplained fever. Document Released: 07/11/2005 Document Revised: 03/13/2013 Document Reviewed: 01/02/2013 [facility] Patient Information 2014 [place], [place].

## 2013-07-29 NOTE — Progress Notes (Signed)
Cultures today-- Increasing proteinuria--check 24 hour urine protein and labs again--were ok last week but likely crescendoing--no severe range sx's or BP's as yet Having some pressure NST reviewed and reactive. FBS 83-101 2 hour pp 90-150 (3 out of range) Feels malaise and SOB Has U/S for growth on 1/9.--Last U/S showed very large baby--discussed possibility of c-section

## 2013-07-30 LAB — GC/CHLAMYDIA PROBE AMP
CT PROBE, AMP APTIMA: NEGATIVE
GC PROBE AMP APTIMA: NEGATIVE

## 2013-07-30 LAB — PROTEIN / CREATININE RATIO, URINE
Creatinine, Urine: 235.2 mg/dL
PROTEIN CREATININE RATIO: 0.19 — AB (ref <0.15)
TOTAL PROTEIN, URINE: 45 mg/dL

## 2013-08-01 LAB — PROTEIN, URINE, 24 HOUR
Protein, 24H Urine: 260 mg/d — ABNORMAL HIGH (ref 50–100)
Protein, Urine: 20 mg/dL

## 2013-08-01 LAB — CREATININE CLEARANCE, URINE, 24 HOUR
CREAT CLEAR: 233 mL/min — AB (ref 75–115)
Creatinine, 24H Ur: 1914 mg/d — ABNORMAL HIGH (ref 700–1800)
Creatinine, Urine: 147.2 mg/dL
Creatinine: 0.57 mg/dL (ref 0.50–1.10)

## 2013-08-01 LAB — CULTURE, BETA STREP (GROUP B ONLY)

## 2013-08-02 ENCOUNTER — Ambulatory Visit (HOSPITAL_COMMUNITY)
Admission: RE | Admit: 2013-08-02 | Discharge: 2013-08-02 | Disposition: A | Discharge status: Home / Self Care | Payer: BC Managed Care – PPO | Source: Ambulatory Visit | Attending: Family Medicine | Admitting: Family Medicine

## 2013-08-02 DIAGNOSIS — O24913 Unspecified diabetes mellitus in pregnancy, third trimester: Secondary | ICD-10-CM

## 2013-08-02 DIAGNOSIS — O09299 Supervision of pregnancy with other poor reproductive or obstetric history, unspecified trimester: Secondary | ICD-10-CM | POA: Insufficient documentation

## 2013-08-02 DIAGNOSIS — E669 Obesity, unspecified: Secondary | ICD-10-CM | POA: Insufficient documentation

## 2013-08-02 DIAGNOSIS — E119 Type 2 diabetes mellitus without complications: Secondary | ICD-10-CM

## 2013-08-02 DIAGNOSIS — O10019 Pre-existing essential hypertension complicating pregnancy, unspecified trimester: Secondary | ICD-10-CM | POA: Insufficient documentation

## 2013-08-02 DIAGNOSIS — O10013 Pre-existing essential hypertension complicating pregnancy, third trimester: Secondary | ICD-10-CM

## 2013-08-02 DIAGNOSIS — O24919 Unspecified diabetes mellitus in pregnancy, unspecified trimester: Secondary | ICD-10-CM | POA: Insufficient documentation

## 2013-08-02 DIAGNOSIS — O10913 Unspecified pre-existing hypertension complicating pregnancy, third trimester: Secondary | ICD-10-CM

## 2013-08-02 DIAGNOSIS — O99210 Obesity complicating pregnancy, unspecified trimester: Secondary | ICD-10-CM

## 2013-08-05 ENCOUNTER — Ambulatory Visit (INDEPENDENT_AMBULATORY_CARE_PROVIDER_SITE_OTHER): Payer: BC Managed Care – PPO | Admitting: Obstetrics and Gynecology

## 2013-08-05 ENCOUNTER — Encounter: Payer: Self-pay | Admitting: Obstetrics and Gynecology

## 2013-08-05 VITALS — BP 147/81 | Wt >= 6400 oz

## 2013-08-05 DIAGNOSIS — O24913 Unspecified diabetes mellitus in pregnancy, third trimester: Secondary | ICD-10-CM

## 2013-08-05 DIAGNOSIS — E669 Obesity, unspecified: Secondary | ICD-10-CM

## 2013-08-05 DIAGNOSIS — O99213 Obesity complicating pregnancy, third trimester: Secondary | ICD-10-CM

## 2013-08-05 DIAGNOSIS — O10019 Pre-existing essential hypertension complicating pregnancy, unspecified trimester: Secondary | ICD-10-CM

## 2013-08-05 DIAGNOSIS — E119 Type 2 diabetes mellitus without complications: Secondary | ICD-10-CM

## 2013-08-05 DIAGNOSIS — O24919 Unspecified diabetes mellitus in pregnancy, unspecified trimester: Secondary | ICD-10-CM

## 2013-08-05 DIAGNOSIS — O99214 Obesity complicating childbirth: Secondary | ICD-10-CM

## 2013-08-05 DIAGNOSIS — O10013 Pre-existing essential hypertension complicating pregnancy, third trimester: Secondary | ICD-10-CM

## 2013-08-05 DIAGNOSIS — O3660X0 Maternal care for excessive fetal growth, unspecified trimester, not applicable or unspecified: Secondary | ICD-10-CM

## 2013-08-05 DIAGNOSIS — O3663X0 Maternal care for excessive fetal growth, third trimester, not applicable or unspecified: Secondary | ICD-10-CM

## 2013-08-05 DIAGNOSIS — O0990 Supervision of high risk pregnancy, unspecified, unspecified trimester: Secondary | ICD-10-CM

## 2013-08-05 LAB — POCT URINALYSIS DIP (DEVICE)
Glucose, UA: 100 mg/dL — AB
NITRITE: NEGATIVE
PROTEIN: 100 mg/dL — AB
Specific Gravity, Urine: >=1.03 (ref 1.005–1.030)
UROBILINOGEN UA: 1 mg/dL (ref 0.0–1.0)
pH: 6 (ref 5.0–8.0)

## 2013-08-05 NOTE — Progress Notes (Signed)
Patient is doing well, she describes epigastric pain post meal and at bedtime consistent with heartburn/indigestion. Patient is not taking protonix consistently and was advised to supplement with Tums. Patient did not bring log book and reports highest fasting 115 and highest postprandial also in the 110's. Advised to bring in log book and meter at next visit. Also discussed 36 week ultrasound which demonstrated an EFW 4200 gm (>90 %tile). Patient had a child 14 years ago weighing 7lb. Discussed risks of shoulder dystocia along with its associated fetal and maternal morbidity/mortality. All questions were answered. Patient will have a repeat growth ultrasound at 38 weeks.  NST reviewed and reactive

## 2013-08-05 NOTE — Progress Notes (Signed)
P = 70       Pt reports pain in chest/upperabdomen x4 days

## 2013-08-06 ENCOUNTER — Other Ambulatory Visit: Payer: Self-pay | Admitting: Obstetrics & Gynecology

## 2013-08-06 ENCOUNTER — Encounter: Payer: Self-pay | Admitting: Family Medicine

## 2013-08-06 ENCOUNTER — Other Ambulatory Visit: Payer: Self-pay | Admitting: Obstetrics and Gynecology

## 2013-08-06 DIAGNOSIS — O0990 Supervision of high risk pregnancy, unspecified, unspecified trimester: Secondary | ICD-10-CM

## 2013-08-07 ENCOUNTER — Ambulatory Visit (HOSPITAL_COMMUNITY): Payer: BC Managed Care – PPO

## 2013-08-08 ENCOUNTER — Ambulatory Visit (HOSPITAL_COMMUNITY)
Admission: RE | Admit: 2013-08-08 | Discharge: 2013-08-08 | Disposition: A | Discharge status: Home / Self Care | Payer: BC Managed Care – PPO | Source: Ambulatory Visit | Attending: Family Medicine | Admitting: Family Medicine

## 2013-08-08 ENCOUNTER — Ambulatory Visit (HOSPITAL_COMMUNITY)
Admission: RE | Admit: 2013-08-08 | Discharge: 2013-08-08 | Disposition: A | Discharge status: Home / Self Care | Payer: BC Managed Care – PPO | Source: Ambulatory Visit | Attending: Obstetrics and Gynecology | Admitting: Obstetrics and Gynecology

## 2013-08-08 DIAGNOSIS — O24919 Unspecified diabetes mellitus in pregnancy, unspecified trimester: Secondary | ICD-10-CM | POA: Insufficient documentation

## 2013-08-08 DIAGNOSIS — O09299 Supervision of pregnancy with other poor reproductive or obstetric history, unspecified trimester: Secondary | ICD-10-CM | POA: Insufficient documentation

## 2013-08-08 DIAGNOSIS — O10019 Pre-existing essential hypertension complicating pregnancy, unspecified trimester: Secondary | ICD-10-CM | POA: Insufficient documentation

## 2013-08-08 DIAGNOSIS — E1139 Type 2 diabetes mellitus with other diabetic ophthalmic complication: Secondary | ICD-10-CM | POA: Insufficient documentation

## 2013-08-08 DIAGNOSIS — E669 Obesity, unspecified: Secondary | ICD-10-CM | POA: Insufficient documentation

## 2013-08-08 DIAGNOSIS — O99210 Obesity complicating pregnancy, unspecified trimester: Secondary | ICD-10-CM

## 2013-08-08 DIAGNOSIS — O0990 Supervision of high risk pregnancy, unspecified, unspecified trimester: Secondary | ICD-10-CM

## 2013-08-08 NOTE — Progress Notes (Signed)
Diamond LundJanet Pace  was seen today for an ultrasound appointment.  See full report in [facility].  Impression: Single IUP at 36 6/7 weeks A2 GDM, suspected fetal macrosomia Limited ultrasuond performed for amniotic fluid assessment AFI 11.8 cm; reactive NST Normal mofidied BPP  Recommendations: Continue antepartum fetal testing as scheduled Follow up growth scan next week  [name], MD

## 2013-08-09 DIAGNOSIS — O139 Gestational [pregnancy-induced] hypertension without significant proteinuria, unspecified trimester: Secondary | ICD-10-CM | POA: Insufficient documentation

## 2013-08-12 ENCOUNTER — Other Ambulatory Visit: Payer: BC Managed Care – PPO

## 2013-08-13 ENCOUNTER — Telehealth: Payer: Self-pay | Admitting: *Deleted

## 2013-08-13 DIAGNOSIS — O1410 Severe pre-eclampsia, unspecified trimester: Secondary | ICD-10-CM | POA: Insufficient documentation

## 2013-08-13 NOTE — Telephone Encounter (Signed)
agree

## 2013-08-13 NOTE — Telephone Encounter (Signed)
Absolutely we cannot see her until released from OB.  Encounter closed.

## 2013-08-13 NOTE — Telephone Encounter (Signed)
Dr Perlie Goldussell calling to schedule a post partum visit for patient for BP check in 1 week and 6 week post partum visit.  Patient has seen [facility] in past and was not happy with her OB provider [facility] Patient [facility] on 08-10-13.  Advised Dr Perlie Goldussell that we only do GYN care and that we would not be able to see her [facility] .Advised that we are happy to have her return to us after 6 week post partum visit.  Route to Dr Hyacinth MeekerMiller and Debbi.  If agreeable, please close encounter.

## 2013-08-15 ENCOUNTER — Other Ambulatory Visit (HOSPITAL_COMMUNITY): Payer: BC Managed Care – PPO

## 2013-08-15 ENCOUNTER — Ambulatory Visit (HOSPITAL_COMMUNITY): Payer: BC Managed Care – PPO

## 2013-08-16 DIAGNOSIS — F411 Generalized anxiety disorder: Secondary | ICD-10-CM | POA: Insufficient documentation

## 2013-08-22 ENCOUNTER — Other Ambulatory Visit (HOSPITAL_COMMUNITY): Payer: BC Managed Care – PPO

## 2013-08-22 ENCOUNTER — Ambulatory Visit (HOSPITAL_COMMUNITY): Payer: BC Managed Care – PPO

## 2013-08-28 ENCOUNTER — Encounter: Payer: Self-pay | Admitting: *Deleted

## 2013-08-29 ENCOUNTER — Ambulatory Visit (HOSPITAL_COMMUNITY): Payer: BC Managed Care – PPO

## 2013-08-29 ENCOUNTER — Other Ambulatory Visit (HOSPITAL_COMMUNITY): Payer: BC Managed Care – PPO

## 2013-09-16 ENCOUNTER — Telehealth: Payer: Self-pay

## 2013-09-16 ENCOUNTER — Ambulatory Visit: Payer: BC Managed Care – PPO | Admitting: Family Medicine

## 2013-09-16 NOTE — Telephone Encounter (Signed)
Pt did not show for appointment today, attempted to call patient.  Left message for patient to call and reschedule appointment.

## 2014-03-26 DIAGNOSIS — L209 Atopic dermatitis, unspecified: Secondary | ICD-10-CM | POA: Insufficient documentation

## 2014-05-26 ENCOUNTER — Encounter: Payer: Self-pay | Admitting: *Deleted

## 2014-09-16 ENCOUNTER — Other Ambulatory Visit: Payer: Self-pay | Admitting: Podiatry

## 2014-11-06 ENCOUNTER — Encounter: Payer: Self-pay | Admitting: Certified Nurse Midwife

## 2014-11-06 ENCOUNTER — Ambulatory Visit (INDEPENDENT_AMBULATORY_CARE_PROVIDER_SITE_OTHER): Payer: BLUE CROSS/BLUE SHIELD | Admitting: Certified Nurse Midwife

## 2014-11-06 VITALS — BP 110/70 | HR 72 | Resp 16 | Ht 65.25 in | Wt 380.0 lb

## 2014-11-06 DIAGNOSIS — Z0000 Encounter for general adult medical examination without abnormal findings: Secondary | ICD-10-CM | POA: Diagnosis not present

## 2014-11-06 DIAGNOSIS — Z124 Encounter for screening for malignant neoplasm of cervix: Secondary | ICD-10-CM | POA: Diagnosis not present

## 2014-11-06 DIAGNOSIS — Z01419 Encounter for gynecological examination (general) (routine) without abnormal findings: Secondary | ICD-10-CM | POA: Diagnosis not present

## 2014-11-06 DIAGNOSIS — N912 Amenorrhea, unspecified: Secondary | ICD-10-CM

## 2014-11-06 LAB — TSH: TSH: 1.509 u[IU]/mL (ref 0.350–4.500)

## 2014-11-06 NOTE — Progress Notes (Addendum)
34 y.o. [id number] Married  Hispanic Fe here for annual exam. Periods none since daughters birth. Delivery date was !-17-15 per c-section. Had PP visit 10/01/13. Patient only nursed for a trial period( a few days) due to issues with blood pressure. Had pap smear there also and per patient negative. [facility] and PCP care.  Patient has surgery planned for left foot due to mass ? Etiology. Patient is working hard to decrease weight and to decrease Hgb. A1-C.  Would like to start back on POP, but has not had anything but slight spotting since 1/16. Not sexually active. No other health issues today.  Patient's last menstrual period was 11/22/2013.          Sexually active: No.  The current method of family planning is none.    Exercising: No.  exercise Smoker:  no  Health Maintenance: Pap: 2015 neg after delivery MMG:  none Colonoscopy:  none BMD:   none TDaP:  2014 Labs: Hgb-14.6 Self breast exam: not done   reports that she has been smoking Cigarettes.  She has never used smokeless tobacco. She reports that she does not drink alcohol or use illicit drugs.  Past Medical History  Diagnosis Date  . Abnormal Pap smear 2005    ASCUS +HPV  . Genital warts   . Diabetes mellitus without complication   . Hypertension   . Syphilis in female   . Anxiety and depression   . Anxiety   . Depression     Past Surgical History  Procedure Laterality Date  . Cholecystectomy  6/10    with liver infection, also occluded bile duct    Current Outpatient Prescriptions  Medication Sig Dispense Refill  . ACCU-CHEK FASTCLIX LANCETS MISC 1 Units by Percutaneous route 4 (four) times daily. 100 each 12  . ALPRAZolam (XANAX) 0.5 MG tablet Take 0.5 mg by mouth.    Marland Kitchen amLODipine (NORVASC) 5 MG tablet Take by mouth.    Marland Kitchen amoxicillin (AMOXIL) 875 MG tablet Take 875 mg by mouth 2 (two) times daily.    . Blood Glucose Monitoring Suppl (BAYER CONTOUR MONITOR) W/DEVICE KIT 1 each  by Does not apply route QID. 1 each prn  . Dulaglutide 0.75 MG/0.5ML SOPN Inject 0.75 mg into the skin.    Marland Kitchen glucose blood (BAYER CONTOUR TEST) test strip Check blood sugar level 4 times daily AM and 2 hours PP 100 each 12  . lisinopril-hydrochlorothiazide (PRINZIDE,ZESTORETIC) 10-12.5 MG per tablet Take by mouth.    . metFORMIN (GLUCOPHAGE-XR) 500 MG 24 hr tablet Take 1,000 mg by mouth 2 (two) times daily.    . methyldopa (ALDOMET) 500 MG tablet TAKE 1 TABLET BY MOUTH THREE TIMES DAILY 90 tablet 0  . pantoprazole (PROTONIX) 40 MG tablet Take 40 mg by mouth.    . sertraline (ZOLOFT) 100 MG tablet Take 150 mg by mouth.     No current facility-administered medications for this visit.    Family History  Problem Relation Age of Onset  . Cancer Father     lung  . Hypertension Mother   . Diabetes Paternal Grandmother   . Cancer Paternal Grandfather     prostate    ROS:  Pertinent items are noted in HPI.  Otherwise, a comprehensive ROS was negative.  Exam:   BP 110/70 mmHg  Pulse 72  Resp 16  Ht 5' 5.25" (1.657 m)  Wt 380 lb (172.367 kg)  BMI 62.78 kg/m2  LMP 11/22/2013 Height: 5' 5.25" (  165.7 cm) Ht Readings from Last 3 Encounters:  11/06/14 5' 5.25" (1.657 m)  04/15/13 5' 6"  (1.676 m)  01/17/13 5' 4.5" (1.638 m)    General appearance: alert, cooperative and appears stated age Head: Normocephalic, without obvious abnormality, atraumatic Neck: no adenopathy, supple, symmetrical, trachea midline and thyroid normal to inspection and palpation Lungs: clear to auscultation bilaterally Breasts: normal appearance, no masses or tenderness, No nipple retraction or dimpling, No nipple discharge or bleeding, No axillary or supraclavicular adenopathy, large pendulous Heart: regular rate and rhythm Abdomen: soft, non-tender; no masses,  no organomegaly Extremities: extremities normal, atraumatic, no cyanosis or edema Skin: Skin color, texture, turgor normal. No rashes or lesions Lymph  nodes: Cervical, supraclavicular, and axillary nodes normal. No abnormal inguinal nodes palpated Neurologic: Grossly normal   Pelvic: External genitalia:  no lesions              Urethra:  normal appearing urethra with no masses, tenderness or lesions              Bartholin's and Skene's: normal                 Vagina: normal appearing vagina with normal color and discharge, no lesions              Cervix: normal appearance , no lesions              Pap taken: Yes.   Bimanual Exam:  Uterus:  unable to palpate except for lower uterine segment due to body habitus, no large masses noted              Adnexa: unable to palpate due to body habitus, no large masses noted               Rectovaginal: Confirms               Anus:  normal sphincter tone, no lesions  Chaperone present: Yes  A:  Well Woman with normal exam  Contraception abstinence, desires Micronor again  Amenorrhea( long history of)  Type 2 Diabetes under PCP management   Hypertension under PCP management  Anxiety on stable medication with PCP management  Morbid Obesity limited pelvic exam.  P:   Reviewed health and wellness pertinent to exam  Discussed will need labs for [place], TSH, and Prolactin, Qualitative HCG to make sure problems as we have done before prior to starting Micronor. Patient agreeable. Patient to come back in 2 weeks for another serum HCG if negative can do Provera challenge and start on Micronor. Patient will schedule. Order placed.  Stressed follow up with PCP. Encouraged to continue her journey on weight loss and caring for her children.  Discussed limited pelvic exam, but had C/section one year ago so feel well assessed at that point and no  PUS needed.  Pap smear taken today with HPVHR   counseled on breast self exam, adequate intake of calcium and vitamin D, diet and exercise  return annually or prn  An After Visit Summary was printed and given to the patient.

## 2014-11-06 NOTE — Patient Instructions (Signed)

## 2014-11-07 LAB — PROLACTIN: Prolactin: 11.7 ng/mL

## 2014-11-07 LAB — HIV ANTIBODY (ROUTINE TESTING W REFLEX): HIV: NONREACTIVE

## 2014-11-07 LAB — FOLLICLE STIMULATING HORMONE: FSH: 7.1 m[IU]/mL

## 2014-11-07 LAB — HCG, SERUM, QUALITATIVE: Preg, Serum: NEGATIVE

## 2014-11-07 LAB — RPR

## 2014-11-08 LAB — IPS N GONORRHOEA AND CHLAMYDIA BY PCR

## 2014-11-09 NOTE — Progress Notes (Signed)
Please document if pt is still nursing.  Thanks.  Reviewed personally.  Lum KeasM. Suzanne Tab Rylee, MD.

## 2014-11-10 LAB — IPS PAP TEST WITH HPV

## 2014-11-10 LAB — HEMOGLOBIN, FINGERSTICK: HEMOGLOBIN, FINGERSTICK: 14.6 g/dL (ref 12.0–16.0)

## 2014-11-10 NOTE — Addendum Note (Signed)
Addended by: Verner CholLEONARD, DEBORAH S on: 11/10/2014 07:43 AM   Modules accepted: Kipp BroodSmartSet

## 2014-11-20 ENCOUNTER — Other Ambulatory Visit: Payer: BLUE CROSS/BLUE SHIELD

## 2014-11-20 ENCOUNTER — Telehealth: Payer: Self-pay | Admitting: Certified Nurse Midwife

## 2014-11-20 NOTE — Telephone Encounter (Signed)
Patient called and cancelled her appointment for labs today due to "laid off from" her job yesterday. She will call back to reschedule at a later date.

## 2014-11-21 NOTE — Telephone Encounter (Signed)
Routed to DL 

## 2014-11-21 NOTE — Telephone Encounter (Signed)
Hold for DL to view-  several labs she needed

## 2014-11-26 NOTE — Telephone Encounter (Signed)
Can you put reminder to call her in one month

## 2014-11-26 NOTE — Telephone Encounter (Signed)
Pt put in reminder to be called in 

## 2014-12-11 ENCOUNTER — Encounter (HOSPITAL_COMMUNITY): Payer: Self-pay | Admitting: Physical Medicine and Rehabilitation

## 2014-12-11 ENCOUNTER — Emergency Department (HOSPITAL_COMMUNITY)
Admission: EM | Admit: 2014-12-11 | Discharge: 2014-12-11 | Disposition: A | Discharge status: Home / Self Care | Payer: Medicaid Other | Attending: Emergency Medicine | Admitting: Emergency Medicine

## 2014-12-11 DIAGNOSIS — Z720 Tobacco use: Secondary | ICD-10-CM | POA: Diagnosis not present

## 2014-12-11 DIAGNOSIS — B070 Plantar wart: Secondary | ICD-10-CM | POA: Diagnosis not present

## 2014-12-11 DIAGNOSIS — Z792 Long term (current) use of antibiotics: Secondary | ICD-10-CM | POA: Diagnosis not present

## 2014-12-11 DIAGNOSIS — R739 Hyperglycemia, unspecified: Secondary | ICD-10-CM

## 2014-12-11 DIAGNOSIS — M79672 Pain in left foot: Secondary | ICD-10-CM | POA: Insufficient documentation

## 2014-12-11 DIAGNOSIS — Z8619 Personal history of other infectious and parasitic diseases: Secondary | ICD-10-CM | POA: Insufficient documentation

## 2014-12-11 DIAGNOSIS — I10 Essential (primary) hypertension: Secondary | ICD-10-CM | POA: Diagnosis not present

## 2014-12-11 DIAGNOSIS — E1165 Type 2 diabetes mellitus with hyperglycemia: Secondary | ICD-10-CM | POA: Insufficient documentation

## 2014-12-11 DIAGNOSIS — Z79899 Other long term (current) drug therapy: Secondary | ICD-10-CM | POA: Insufficient documentation

## 2014-12-11 DIAGNOSIS — F418 Other specified anxiety disorders: Secondary | ICD-10-CM | POA: Insufficient documentation

## 2014-12-11 LAB — CBG MONITORING, ED: Glucose-Capillary: 261 mg/dL — ABNORMAL HIGH (ref 65–99)

## 2014-12-11 NOTE — ED Notes (Signed)
Pt presents to department for evaluation of L foot pain. Ongoing for several months. Pt states issues with warts and wounds to bottom of foot. Also states elevated blood sugar. Pt is alert and oriented x4.

## 2014-12-11 NOTE — ED Provider Notes (Signed)
CSN: [id number]     Arrival date & time 12/11/14  1331 History  This chart was scribed for non-physician practitioner, Carlisle Cater, PA-C working with Blanchie Dessert, MD by Tula Nakayama, ED scribe. This patient was seen in room TR06C/TR06C and the patient's care was started at 4:08 PM    Chief Complaint  Patient presents with  . Foot Pain  . Hyperglycemia   The history is provided by the patient. No language interpreter was used.    HPI Comments: Diamond Pace is a 34 y.o. female with a history of DM who presents to the Emergency Department complaining of constant, moderate left foot pain that started a few months ago. Pt reports multiple warts on the bottom of her left foot as an associated symptom. She has tried Epsom salt soaks and scraping off the warts with no relief. Pt was seen by a podiatrist 1.5-2 months ago who removed some of the larger warts and recommended surgery. She has not been able to follow-up with the surgery because she had elevated A1C of 10.4 and then lost health insurance. Pt denies similar warts on her right foot.  Regarding high blood sugar, she is controlled on oral antihyperglycemics and insulin. No fever, N/V, abdominal pain.   Past Medical History  Diagnosis Date  . Abnormal Pap smear 2005    ASCUS +HPV  . Genital warts   . Diabetes mellitus without complication   . Hypertension   . Syphilis in female   . Anxiety and depression   . Anxiety   . Depression    Past Surgical History  Procedure Laterality Date  . Cholecystectomy  6/10    with liver infection, also occluded bile duct   Family History  Problem Relation Age of Onset  . Cancer Father     lung  . Hypertension Father   . Stroke Father   . Heart disease Father   . Hypertension Mother   . Diabetes Paternal Grandmother   . Cancer Paternal Grandfather     prostate   History  Substance Use Topics  . Smoking status: Current Every Day Smoker    Types: Cigarettes    Last  Attempt to Quit: 12/02/2012  . Smokeless tobacco: Never Used     Comment: 5 a day  . Alcohol Use: No   OB History    Gravida Para Term Preterm AB TAB SAB Ectopic Multiple Living   _0 Review of Systems  Constitutional: Negative for fever.  HENT: Negative for rhinorrhea and sore throat.   Eyes: Negative for redness.  Respiratory: Negative for cough.   Cardiovascular: Negative for chest pain.  Gastrointestinal: Negative for nausea, vomiting, abdominal pain and diarrhea.  Genitourinary: Negative for dysuria.  Musculoskeletal: Positive for myalgias. Negative for arthralgias.  Skin: Positive for wound. Negative for rash.  Neurological: Negative for headaches.    Allergies  Review of patient's allergies indicates no known allergies.  Home Medications   Prior to Admission medications   Medication Sig Start Date End Date Taking? Authorizing Provider  ACCU-CHEK FASTCLIX LANCETS MISC 1 Units by Percutaneous route 4 (four) times daily. 03/04/13   Deirdre Freida Busman, CNM  ALPRAZolam (XANAX) 0.5 MG tablet Take 0.5 mg by mouth. 09/04/14   Historical Provider, MD  amLODipine (NORVASC) 5 MG tablet Take by mouth. 09/05/13   Historical Provider, MD  amoxicillin (AMOXIL) 875 MG tablet Take 875 mg by mouth 2 (two) times daily.  Historical Provider, MD  Blood Glucose Monitoring Suppl (BAYER CONTOUR MONITOR) W/DEVICE KIT 1 each by Does not apply route QID. 03/04/13   Deirdre C Poe, CNM  Dulaglutide 0.75 MG/0.5ML SOPN Inject 0.75 mg into the skin. 06/18/14   Historical Provider, MD  glucose blood (BAYER CONTOUR TEST) test strip Check blood sugar level 4 times daily AM and 2 hours PP 03/04/13   Deirdre C Poe, CNM  lisinopril-hydrochlorothiazide (PRINZIDE,ZESTORETIC) 10-12.5 MG per tablet Take by mouth. 09/05/13   Historical Provider, MD  metFORMIN (GLUCOPHAGE-XR) 500 MG 24 hr tablet Take 1,000 mg by mouth 2 (two) times daily.    Historical Provider, MD  methyldopa (ALDOMET) 500 MG tablet TAKE  1 TABLET BY MOUTH THREE TIMES DAILY 08/06/13   Guss Bunde, MD  pantoprazole (PROTONIX) 40 MG tablet Take 40 mg by mouth.    Historical Provider, MD  sertraline (ZOLOFT) 100 MG tablet Take 150 mg by mouth.    Historical Provider, MD   BP 125/79 mmHg  Pulse 82  Temp(Src) 98.3 F (36.8 C) (Oral)  Resp 16  SpO2 98%   Physical Exam  Constitutional: She appears well-developed and well-nourished. No distress.  HENT:  Head: Normocephalic and atraumatic.  Eyes: Conjunctivae and EOM are normal. Right eye exhibits no discharge. Left eye exhibits no discharge.  Neck: Normal range of motion. Neck supple. No tracheal deviation present.  Cardiovascular: Normal rate, regular rhythm and normal heart sounds.   Pulmonary/Chest: Effort normal and breath sounds normal. No respiratory distress.  Abdominal: Soft. There is no tenderness.  Neurological: She is alert.  Skin: Skin is warm and dry.  Several plantar warts noted to the sole of the left foot. No infection. Very tender at the base of the great toe.  Psychiatric: She has a normal mood and affect. Her behavior is normal.  Nursing note and vitals reviewed.   ED Course  Procedures  DIAGNOSTIC STUDIES: Oxygen Saturation is 98% on RA, normal by my interpretation.    COORDINATION OF CARE: 4:07 PM Warts with no signs of infection. Discussed with pt concerns for removing warts in the ED, including elevated blood sugar and uncontrolled DM. Will refer to [facility] and podiatry. Pt agreed to plan.   Labs Review Labs Reviewed  CBG MONITORING, ED - Abnormal; Notable for the following:    Glucose-Capillary 261 (*)    All other components within normal limits    Imaging Review No results found.   EKG Interpretation None       Patient encouraged to have her shoes as muich as possible, continue soaks.  Patient urged to return with worsening symptoms or other concerns. Patient verbalized understanding and agrees with plan.    MDM    Final diagnoses:  Left foot pain  Hyperglycemia   Left foot pain: Related to plantar warts. These are chronic in nature. No complication with infection. Patient will need podiatry expertise for these.  Hyperglycemia: No evidence of DKA clinically. Patient continues to work with PCP to adjust her antihyperglycemic regimen. No changes today. No diabetic emergency suspected.  I personally performed the services described in this documentation, which was scribed in my presence. The recorded information has been reviewed and is accurate.    Carlisle Cater, PA-C 12/11/14 1649  Blanchie Dessert, MD 12/11/14 [phone]

## 2014-12-11 NOTE — Discharge Instructions (Signed)
Please read and follow all provided instructions.  Your diagnoses today include:  1. Left foot pain   2. Hyperglycemia     Tests performed today include:  Vital signs. See below for your results today.   Medications prescribed:   None  Take any prescribed medications only as directed.  Home care instructions:   Follow any educational materials contained in this packet  Follow-up instructions: Please follow-up with your primary care provider or the podiatrist listed as directed.   Return instructions:   Please return to the Emergency Department if you experience worsening symptoms.   Please return if you have any other emergent concerns.  Additional Information:  Your vital signs today were: BP 125/79 mmHg   Pulse 82   Temp(Src) 98.3 F (36.8 C) (Oral)   Resp 16   SpO2 98% If your blood pressure (BP) was elevated above 135/85 this visit, please have this repeated by your doctor within one month. --------------

## 2014-12-16 ENCOUNTER — Telehealth: Payer: Self-pay

## 2014-12-16 NOTE — Telephone Encounter (Signed)
-----   Message from Eliezer Bottomavina J Ibrohim Simmers, [place] sent at 11/26/2014 12:52 PM EDT ----- Pt needs hcg & didn't come back to get it done. Pt needs to  Be reminded this was to be done bc she hasnt had a cycle in a yr. Pt needs to abstain from intercourse. Check with DL & see if pt needs urine hcg & labwork hcg

## 2014-12-16 NOTE — Telephone Encounter (Signed)
Patient states she had a normal cycle 11-30-14 and it lasted 7 days. Pt aware if she goes more than 3mths with no cycle to call us & let us know because she will need an appt. Pt agrees. Please close encounter.

## 2014-12-17 NOTE — Telephone Encounter (Signed)
Okay to close encounter per Kathleen Argueeborah Leonard,CNM

## 2015-09-09 IMAGING — US US OB FOLLOW-UP
1 series · 12 of 28 positions shown · non-contrast
Comparison: none

[Series 1: us ob follow-up · 0.26mm/px · 12 of 51 slices shown]
[im 2/51]
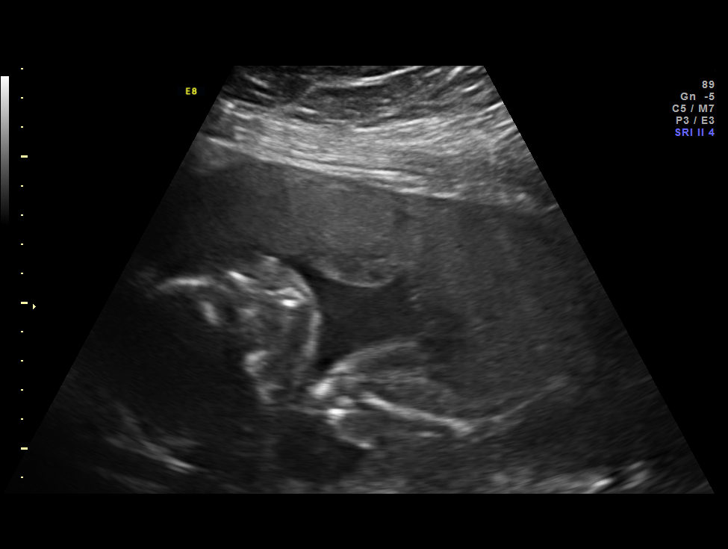
[im 6/51]
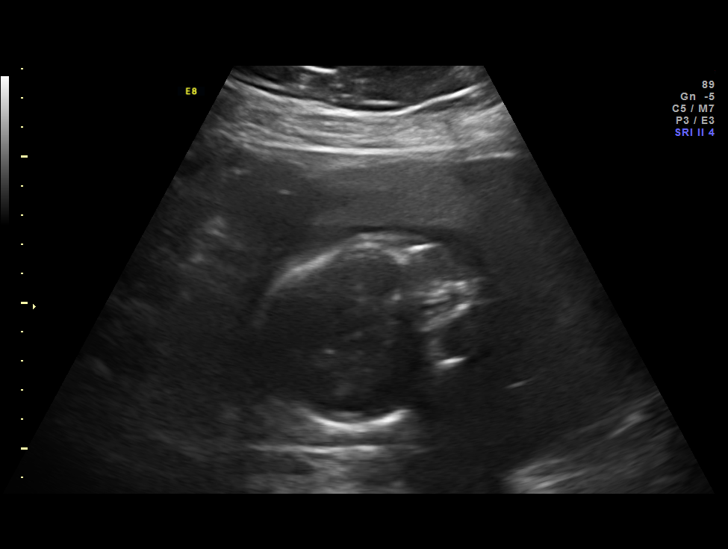
[im 10/51]
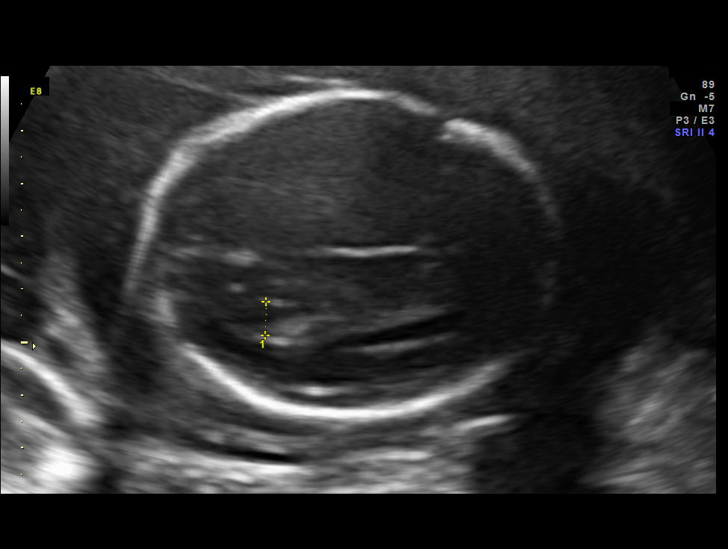
[im 15/51]
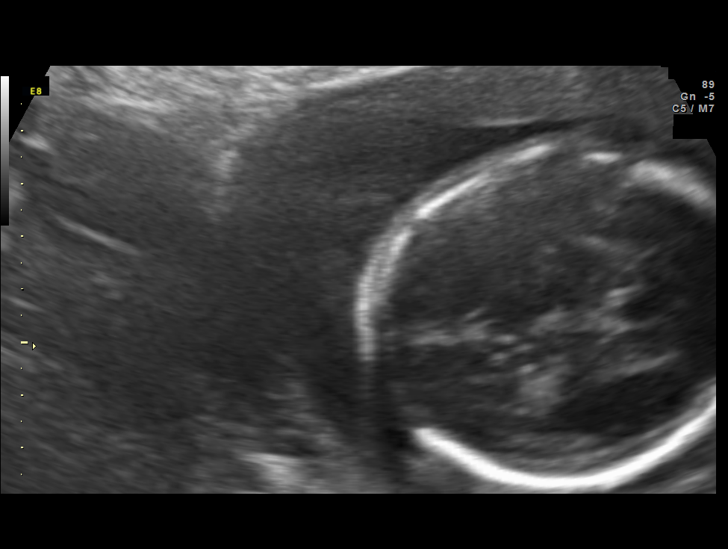
[im 19/51]
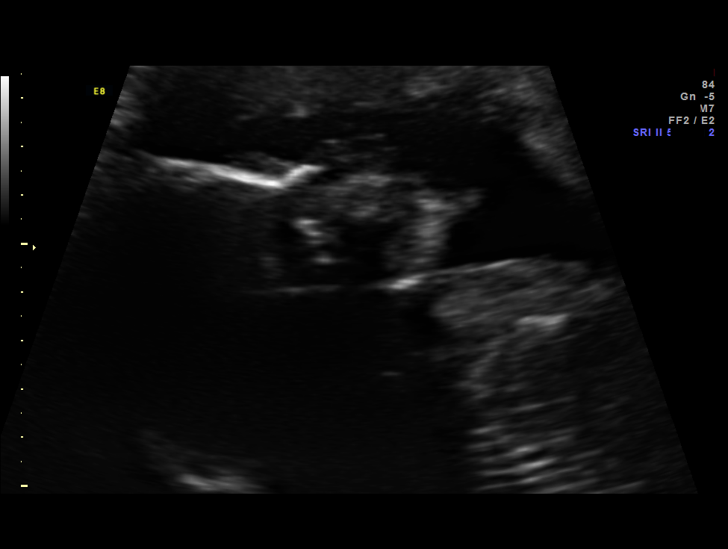
[im 23/51]
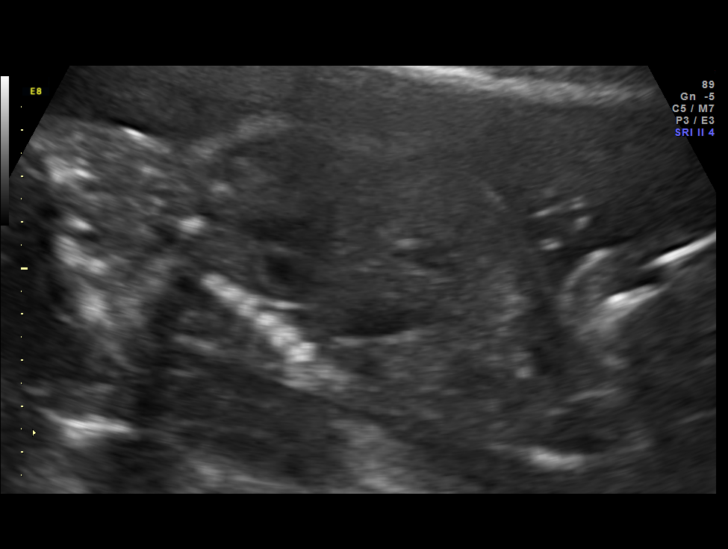
[im 28/51]
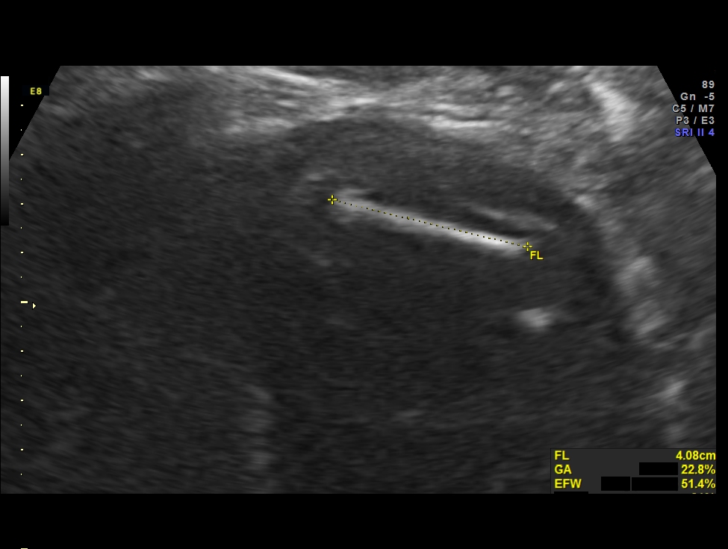
[im 32/51]
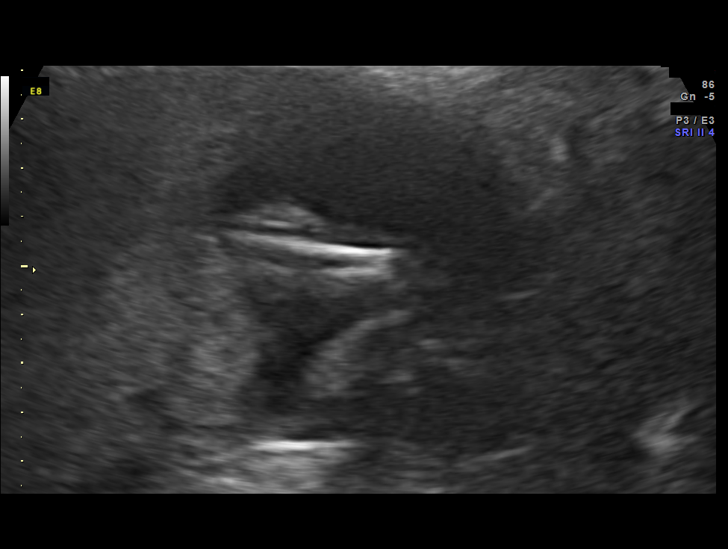
[im 36/51]
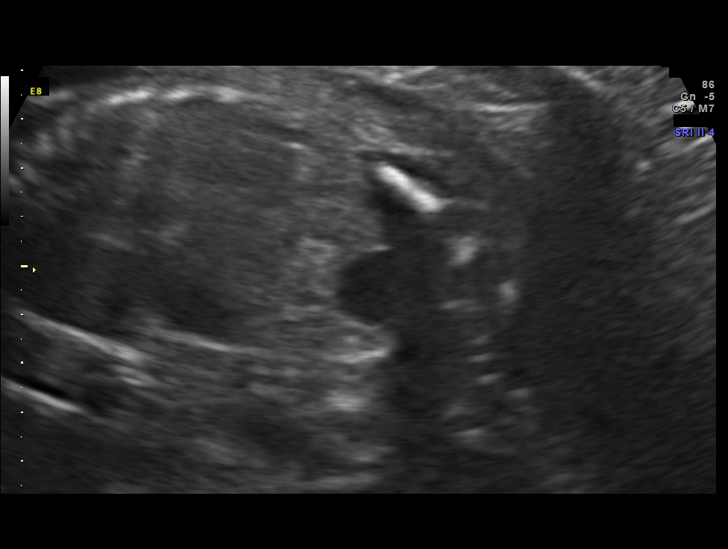
[im 41/51]
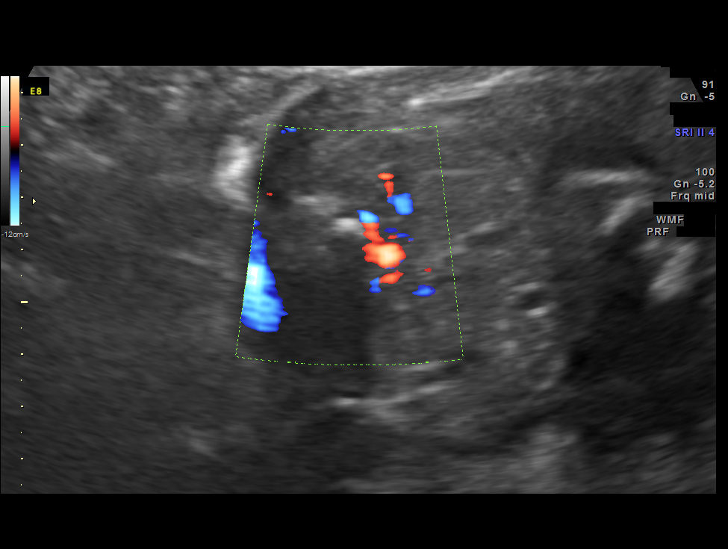
[im 45/51]
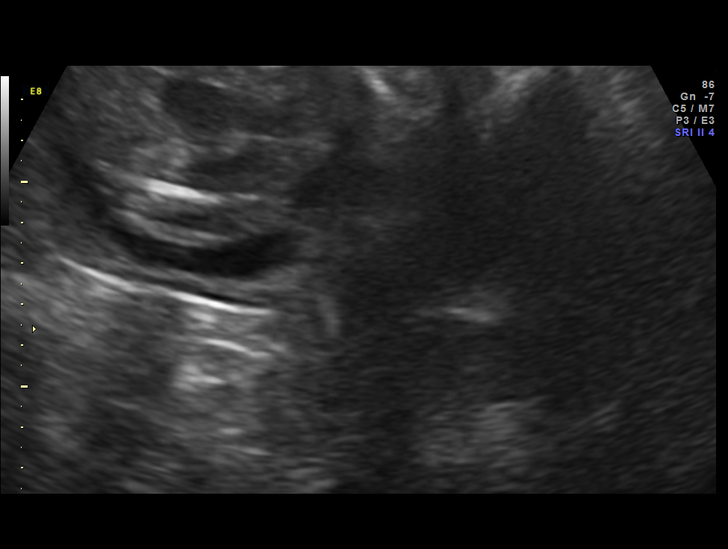
[im 49/51]
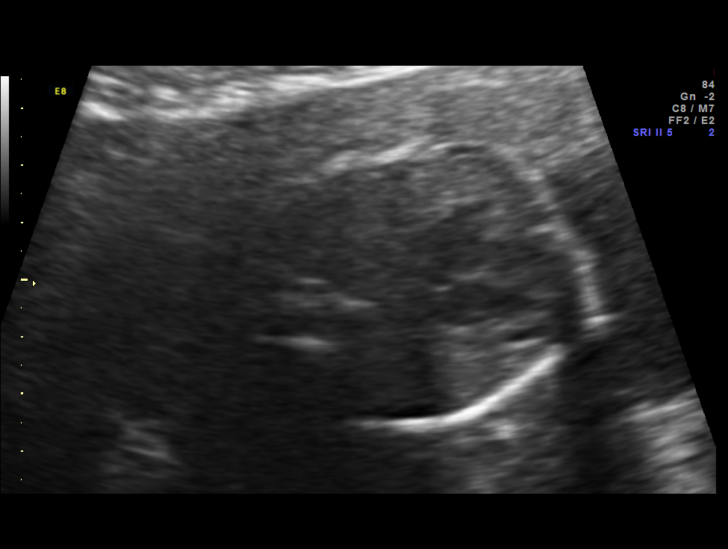

[12 of 28 positions shown; findings below may reference images not displayed]

OBSTETRICS REPORT
                      (Signed Final 05/08/2013 [DATE])

             MONIKA

Service(s) Provided

 US OB FOLLOW UP                                       76816.1
Indications

 Hypertension - Chronic/Pre-existing on Aldomet
 Maternal morbid obesity
 Diabetes - Pregestational on Metformin

 Poor obstetrical history (Prior Ectopic)
 Follow-up incomplete fetal anatomic evaluation
Fetal Evaluation

 Num Of Fetuses:    1
 Fetal Heart Rate:  169                          bpm
 Cardiac Activity:  Observed
 Presentation:      Breech
 Placenta:          Anterior, above cervical os
 P. Cord            Visualized
 Insertion:

 Amniotic Fluid
 AFI FV:      Subjectively within normal limits
                                             Larg Pckt:     4.7  cm
Biometry

 BPD:     59.9  mm     G. Age:  24w 3d                CI:         73.4   70 - 86
 OFD:     81.6  mm                                    FL/HC:      18.0   18.7 -

 HC:     227.3  mm     G. Age:  24w 5d       73  %    HC/AC:      1.19   1.05 -

 AC:     191.8  mm     G. Age:  23w 6d       48  %    FL/BPD:     68.3   71 - 87
 FL:      40.9  mm     G. Age:  23w 2d       24  %    FL/AC:      21.3   20 - 24
 HUM:     39.2  mm     G. Age:  24w 0d       48  %

 Est. FW:     627  gm      1 lb 6 oz     53  %
Gestational Age

 LMP:           29w 0d        Date:  10/17/12                 EDD:   07/24/13
 U/S Today:     24w 1d                                        EDD:   08/27/13
 Best:          23w 5d     Det. By:  Early Ultrasound         EDD:   08/30/13
                                     (01/15/13)
Anatomy

 Cranium:          Appears normal         Aortic Arch:      Not well visualized
 Fetal Cavum:      Appears normal         Ductal Arch:      Not well visualized
 Ventricles:       Appears normal         Diaphragm:        Appears normal
 Choroid Plexus:   Appears normal         Stomach:          Appears normal, left
                                                            sided
 Cerebellum:       Appears normal         Abdomen:          Appears normal
 Posterior Fossa:  Appears normal         Abdominal Wall:   Appears nml (cord
                                                            insert, abd wall)
 Nuchal Fold:      Not applicable (>20    Cord Vessels:     Previously seen
                   wks GA)
 Face:             Appears normal         Kidneys:          Appear normal
                   (orbits and profile)
 Lips:             Not well visualized    Bladder:          Appears normal
 Palate:           Not well visualized    Spine:            Not well visualized
 Heart:            Not well visualized    Lower             Previously seen
                                          Extremities:
 RVOT:             Not well visualized    Upper             Previously seen
                                          Extremities:
 LVOT:             Not well visualized

 Other:  Technically difficult due to  maternal habitus.
Cervix Uterus Adnexa

 Cervical Length:    4        cm

 Cervix:       Normal appearance by transabdominal scan. Appears
               closed, without funnelling.
Comments

 The patient's fetal anatomic survey is not complete due to
 maternal body habitus and fetal position.  However, no gross
 fetal anomalies were identified.  A follow-up ultrasound will be
 performed in 4 weeks to reassess fetal growth and anatomy.
Impression

 Single living intrauterine pregnancy at 23 weeks 5 days.
 Appropriate interval fetal growth (53%).
 Normal amniotic fluid volume.
 Normal interval fetal anatomy.
Recommendations

 Recommend follow-up ultrasound examination in 4 weeks.

                Similien, Berni

## 2015-10-09 IMAGING — US US OB FOLLOW-UP
1 series · 12 of 28 positions shown · non-contrast
Comparison: none

[Series 1: us ob follow-up · 0.15mm/px · 12 of 37 slices shown]
[im 2/37]
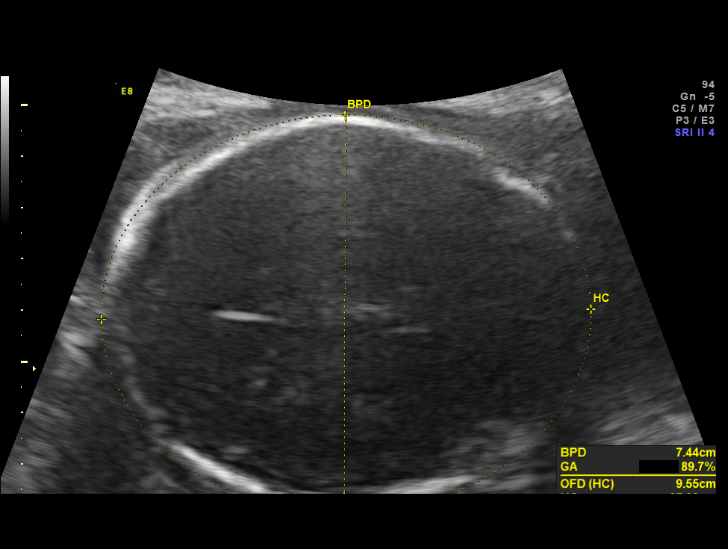
[im 5/37]
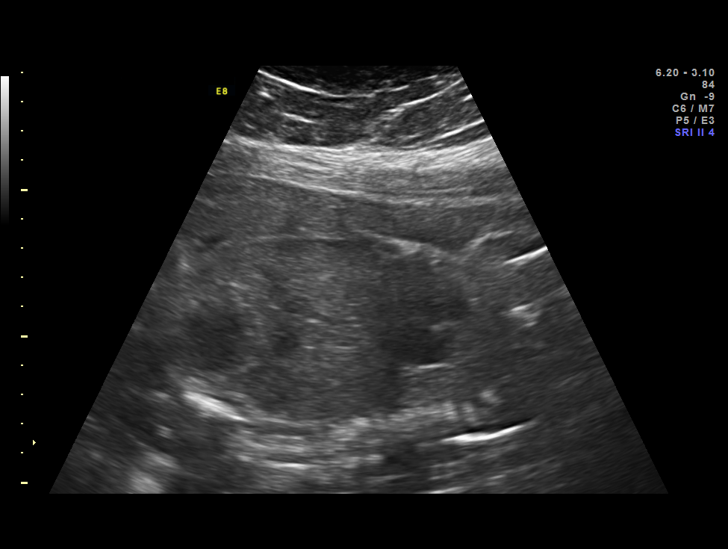
[im 7/37]
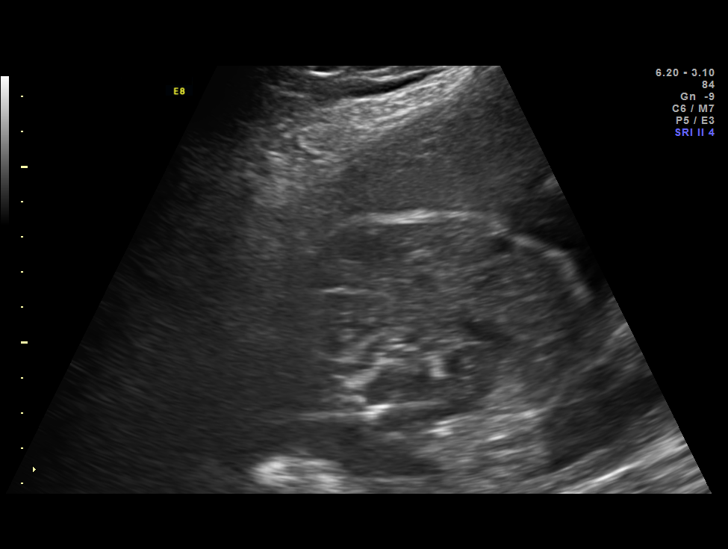
[im 11/37]
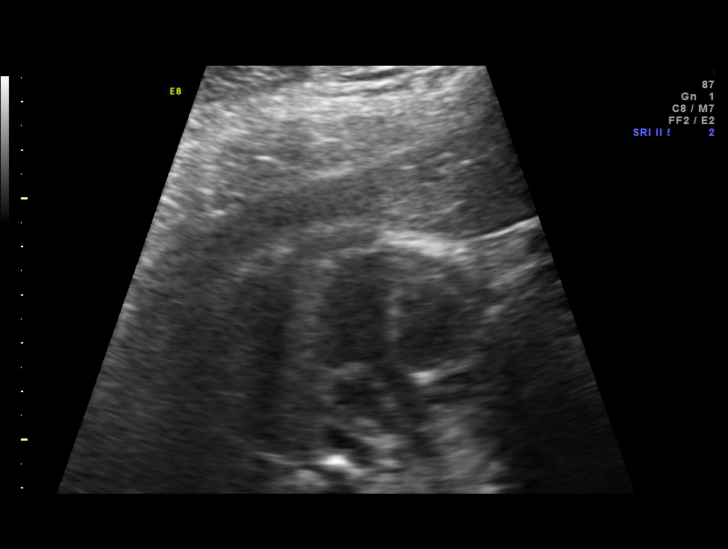
[im 14/37]
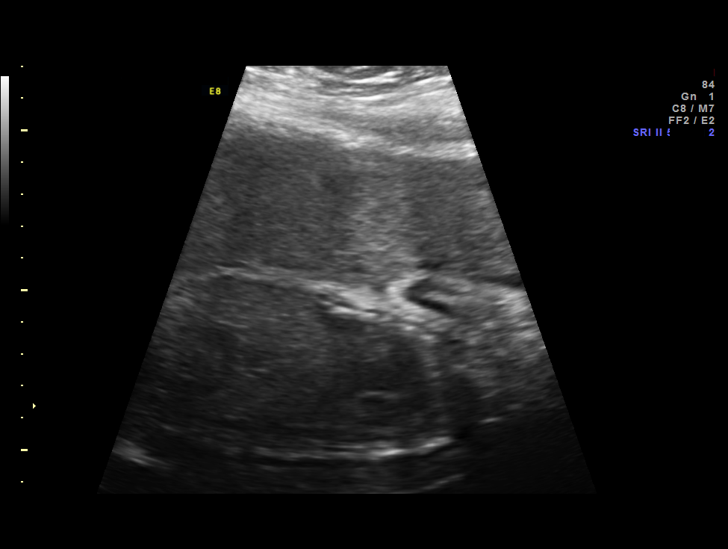
[im 17/37]
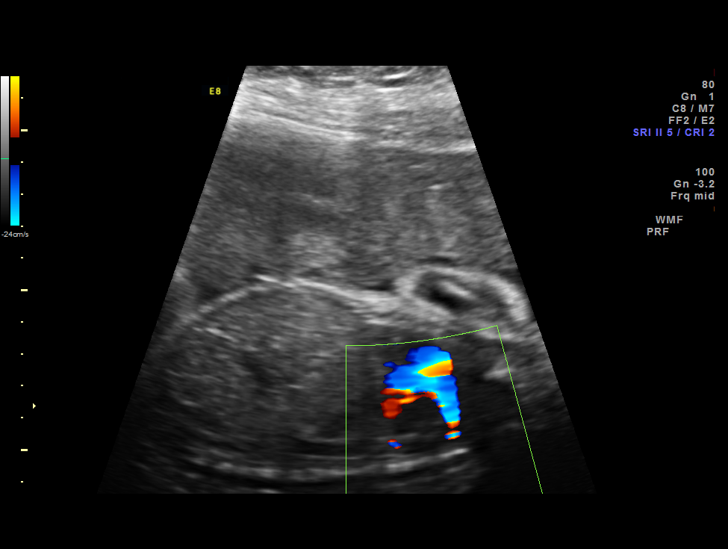
[im 21/37]
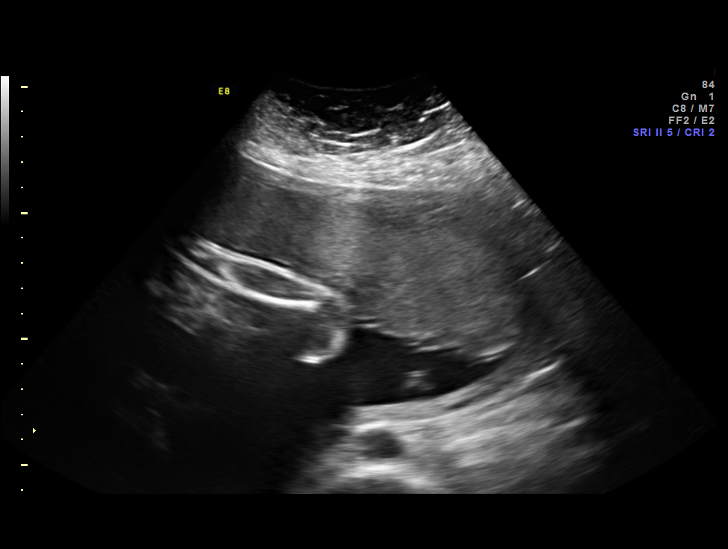
[im 23/37]
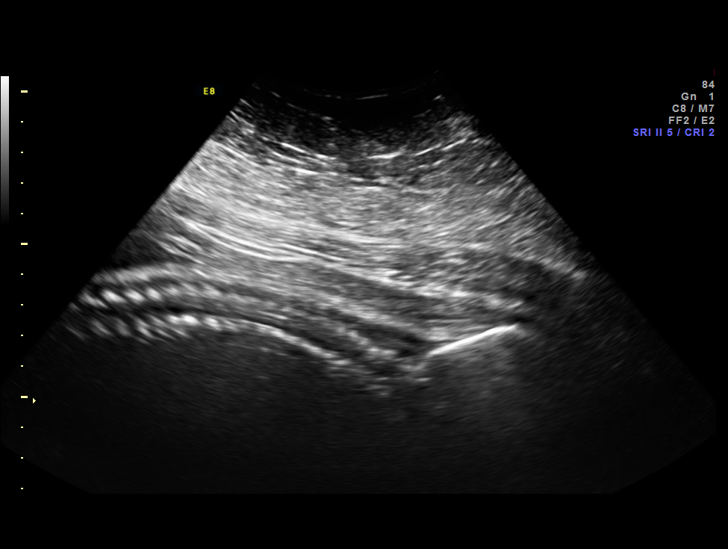
[im 26/37]
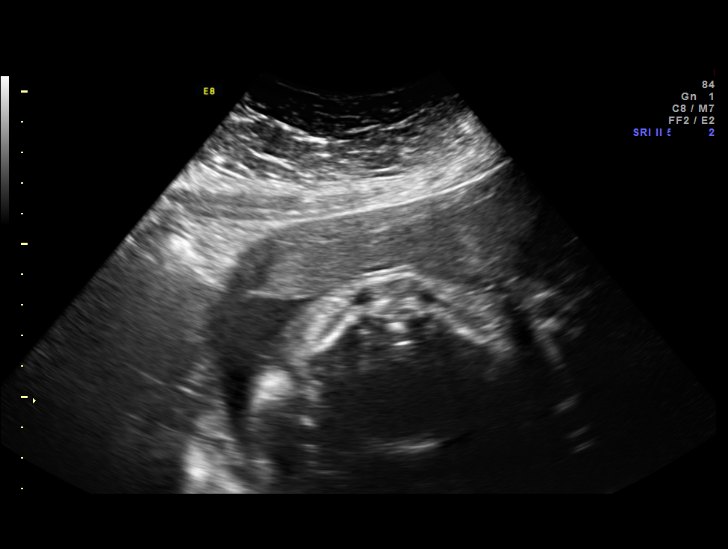
[im 30/37]
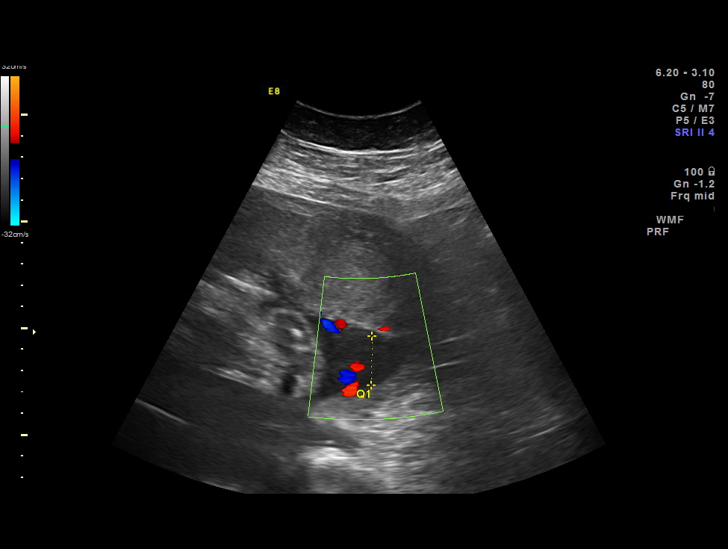
[im 33/37]
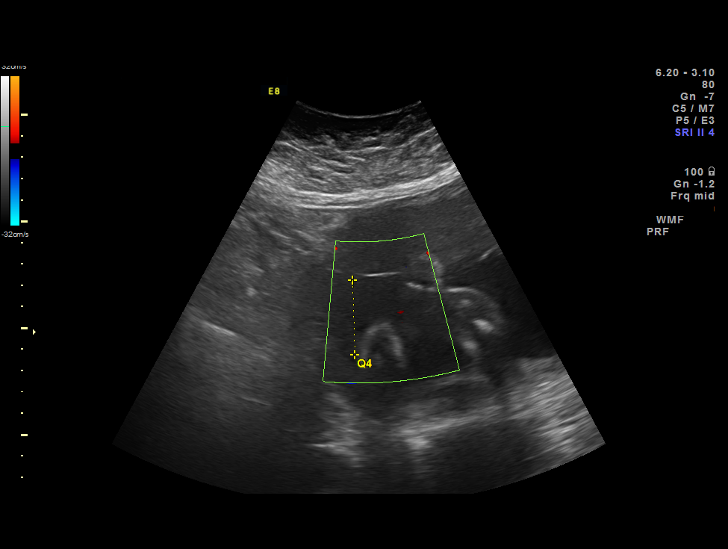
[im 35/37]
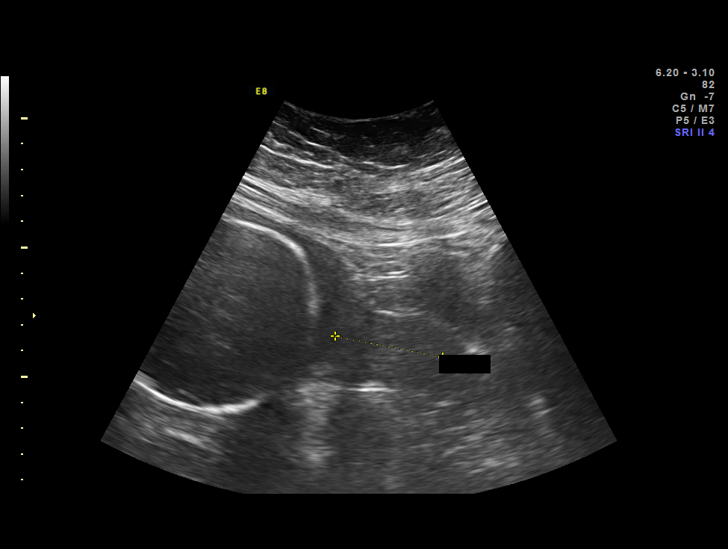

[12 of 28 positions shown; findings below may reference images not displayed]

OBSTETRICS REPORT
                      (Signed Final 06/07/2013 [DATE])

             ALAIN DANIEL

Service(s) Provided

 US OB FOLLOW UP                                       76816.1
Indications

 Hypertension - Chronic/Pre-existing on Aldomet
 Maternal morbid obesity
 Diabetes - Pregestational on Metformin and
 glyburide
 Poor obstetrical history (Prior Ectopic)
 Follow-up incomplete fetal anatomic evaluation
Fetal Evaluation

 Num Of Fetuses:    1
 Preg. Location:    Intrauterine
 Fetal Heart Rate:  155                          bpm
 Cardiac Activity:  Observed
 Presentation:      Cephalic
 Placenta:          Anterior, above cervical os
 P. Cord            Previously Visualized
 Insertion:

 Amniotic Fluid
 AFI FV:      Subjectively within normal limits
 AFI Sum:     14.1    cm       46  %Tile     Larg Pckt:    4.48  cm
 RUQ:   2.31    cm   RLQ:    3.5    cm    LUQ:   3.81    cm   LLQ:    4.48   cm
Biometry

 BPD:     74.4  mm     G. Age:  29w 6d                CI:         77.9   70 - 86
 OFD:     95.5  mm                                    FL/HC:      20.3   18.8 -

 HC:     272.8  mm     G. Age:  29w 5d       75  %    HC/AC:      1.04   1.05 -

 AC:     263.5  mm     G. Age:  30w 3d       96  %    FL/BPD:     74.6   71 - 87
 FL:      55.5  mm     G. Age:  29w 2d       71  %    FL/AC:      21.1   20 - 24

 Est. FW:    5165  gm      3 lb 4 oz     82  %
Gestational Age

 LMP:           33w 2d        Date:  10/17/12                 EDD:   07/24/13
 U/S Today:     29w 6d                                        EDD:   08/17/13
 Best:          28w 0d     Det. By:  Early Ultrasound         EDD:   08/30/13
                                     (01/15/13)
Anatomy

 Cranium:          Appears normal         Aortic Arch:      Not well visualized
 Fetal Cavum:      Previously seen        Ductal Arch:      Appears normal
 Ventricles:       Appears normal         Diaphragm:        Appears normal
 Choroid Plexus:   Previously seen        Stomach:          Appears normal, left
                                                            sided
 Cerebellum:       Previously seen        Abdomen:          Appears normal
 Posterior Fossa:  Previously seen        Abdominal Wall:   Previously seen
 Nuchal Fold:      Not applicable (>20    Cord Vessels:     Previously seen
                   wks GA)
 Face:             Orbits and profile     Kidneys:          Appear normal
                   previously seen
 Lips:             Appears normal         Bladder:          Appears normal
 Heart:            Appears normal         Spine:            Appears normal
                   (4CH, axis, and
                   situs)
 RVOT:             Appears normal         Lower             Previously seen
                                          Extremities:
 LVOT:             Appears normal         Upper             Previously seen
                                          Extremities:

 Other:  Fetus appears to be a female. Technicallly difficult due to advanced
         GA and maternal habitus.
Cervix Uterus Adnexa

 Cervical Length:    4.23     cm

 Cervix:       Normal appearance by transabdominal scan.
 Uterus:       No abnormality visualized.

 Adnexa:     No abnormality visualized.
Impression

 IUP at 28+0 weeks
 Normal interval anatomy; anatomic survey complete except
 for AA
 Normal amniotic fluid volume
 Appropriate interval growth with EFW at the 82nd %tile
Recommendations

 Follow-up ultrasound for growth in 4 weeks
 Testing at 32 weeks

 ALAIN DANIEL with us.  Please do not hesitate to

## 2015-11-17 ENCOUNTER — Ambulatory Visit: Payer: BLUE CROSS/BLUE SHIELD | Admitting: Certified Nurse Midwife

## 2015-11-20 ENCOUNTER — Ambulatory Visit (INDEPENDENT_AMBULATORY_CARE_PROVIDER_SITE_OTHER): Payer: BLUE CROSS/BLUE SHIELD | Admitting: Certified Nurse Midwife

## 2015-11-20 ENCOUNTER — Encounter: Payer: Self-pay | Admitting: Certified Nurse Midwife

## 2015-11-20 VITALS — BP 102/68 | HR 70 | Resp 16 | Ht 65.5 in | Wt 368.0 lb

## 2015-11-20 DIAGNOSIS — Z01419 Encounter for gynecological examination (general) (routine) without abnormal findings: Secondary | ICD-10-CM | POA: Diagnosis not present

## 2015-11-20 DIAGNOSIS — Z0000 Encounter for general adult medical examination without abnormal findings: Secondary | ICD-10-CM | POA: Diagnosis not present

## 2015-11-20 DIAGNOSIS — Z3009 Encounter for other general counseling and advice on contraception: Secondary | ICD-10-CM | POA: Diagnosis not present

## 2015-11-20 LAB — POCT URINE PREGNANCY: Preg Test, Ur: NEGATIVE

## 2015-11-20 NOTE — Patient Instructions (Signed)

## 2015-11-20 NOTE — Progress Notes (Signed)
Encounter reviewed Jill Jertson, MD   

## 2015-11-20 NOTE — Progress Notes (Signed)
35 y.o. [id number] Divorced  Hispanic Fe here for annual exam. Periods 3 times in the past year. All have been heavy with clotting, with 7 day duration.  Weight now down to 368 had been 380. Working on with diet and chasing after 34 year old she thinks help.No partner change, but trust concerns, would like STD screening. Sees Virl Sonammy Boyd with [facility], for hypertension,diabetes, anxiety and cholesterol medication control/labs and aex. Patient would like consistent contraception or BTL. Concerned about risk of another pregnancy, due [facility] and diabetes with pregnancy. Does not desire any more pregnancies. All medical issues stable at this time and feel."so much better". No other health issues today.  Patient's last menstrual period was 11/11/2015.          Sexually active: No. not in past year The current method of family planning is abstinence.    Exercising: Yes.    walking Smoker:  no  Health Maintenance: Pap:  11-06-14 neg HPV HR neg MMG:  none Colonoscopy:  none BMD:   none TDaP:  2014 Shingles: no Pneumonia: 2016 Hep C and HIV: HIV 2016 neg, Hep C neg 2016 Labs: none Self breast exam: not done   reports that she has been smoking Cigarettes.  She has never used smokeless tobacco. She reports that she does not drink alcohol or use illicit drugs.  Past Medical History  Diagnosis Date  . Abnormal Pap smear 2005    ASCUS +HPV  . Genital warts   . Diabetes mellitus without complication (HCC)   . Hypertension   . Syphilis in female   . Anxiety and depression   . Anxiety   . Depression     Past Surgical History  Procedure Laterality Date  . Cholecystectomy  6/10    with liver infection, also occluded bile duct    Current Outpatient Prescriptions  Medication Sig Dispense Refill  . ACCU-CHEK FASTCLIX LANCETS MISC 1 Units by Percutaneous route 4 (four) times daily. 100 each 12  . ALPRAZolam (XANAX) 0.5 MG tablet Take 0.5 mg by mouth.    Marland Kitchen. amLODipine (NORVASC) 5 MG tablet Take by mouth.     Marland Kitchen. atorvastatin (LIPITOR) 10 MG tablet Take 10 mg by mouth.    . Dulaglutide (TRULICITY) 0.75 MG/0.5ML SOPN Inject into the skin.    Marland Kitchen. glucose blood (BAYER CONTOUR TEST) test strip Check blood sugar level 4 times daily AM and 2 hours PP 100 each 12  . Insulin Detemir (LEVEMIR FLEXPEN) 100 UNIT/ML Pen Inject into the skin.    Marland Kitchen. lisinopril-hydrochlorothiazide (PRINZIDE,ZESTORETIC) 10-12.5 MG per tablet Take by mouth.    . pantoprazole (PROTONIX) 40 MG tablet Take 40 mg by mouth.    . sertraline (ZOLOFT) 100 MG tablet Take 150 mg by mouth.    . SitaGLIPtin-MetFORMIN HCl (JANUMET XR) 50-1000 MG TB24 Take by mouth.     No current facility-administered medications for this visit.    Family History  Problem Relation Age of Onset  . Cancer Father     lung  . Hypertension Father   . Stroke Father   . Heart disease Father   . Hypertension Mother   . Diabetes Paternal Grandmother   . Cancer Paternal Grandfather     prostate    ROS:  Pertinent items are noted in HPI.  Otherwise, a comprehensive ROS was negative.  Exam:   BP 102/68 mmHg  Pulse 70  Resp 16  Ht 5' 5.5" (1.664 m)  Wt 368 lb (166.924 kg)  BMI 60.29  kg/m2  LMP 11/11/2015 Height: 5' 5.5" (166.4 cm) Ht Readings from Last 3 Encounters:  11/20/15 5' 5.5" (1.664 m)  11/06/14 5' 5.25" (1.657 m)  04/15/13  (1.676 m)    General appearance: alert, cooperative and appears stated age Head: Normocephalic, without obvious abnormality, atraumatic Neck: no adenopathy, supple, symmetrical, trachea midline and thyroid normal to inspection and palpation Lungs: clear to auscultation bilaterally Breasts: normal appearance, no masses or tenderness, No nipple retraction or dimpling, No nipple discharge or bleeding, No axillary or supraclavicular adenopathy Heart: regular rate and rhythm Abdomen: soft, non-tender; no masses,  no organomegaly Extremities: extremities normal, atraumatic, no cyanosis or edema Skin: Skin color, texture,  turgor normal. No rashes or lesions Lymph nodes: Cervical, supraclavicular, and axillary nodes normal. No abnormal inguinal nodes palpated Neurologic: Grossly normal   Pelvic: External genitalia:  no lesions              Urethra:  normal appearing urethra with no masses, tenderness or lesions              Bartholin's and Skene's: normal                 Vagina: normal appearing vagina with normal color and discharge, no lesions              Cervix: normal,non tender,no lesions              Pap taken: No. Bimanual Exam:  Uterus:  normal and non tender, difficulty to feel due to body habitus              Adnexa: no mass, fullness, tenderness and unable to palpate adnexa due body habitus,                Rectovaginal: Confirms               Anus:  normal sphincter tone, no lesions  Chaperone present: yes  A:  Well Woman with normal exam  Contraception desired if needed  History of long periods of amenorrhea with normal labs, more related to weight changes.   Morbid Obesity with limited pelvic exam  Hypertension/diabetes/cholesterol/anxiety with PCP management  STD screening  P:   Reviewed health and wellness pertinent to exam  Discussed BTL and concerns with anesthesia and her weight at this point. Discussed Mirena IUD and would provide long term contraception and help manage the concerns with long periods of amenorrhea history and concerns with hyperplasia and heavy bleeding. Discussed risks/benefits/insertion/removal and bleeding profile expectation. Feel this would be good choice for her with 5 years of contraception. Patient feels this is a better choice and would like to proceed. Due to know contraception and negative UPT today, will need Serum HCG qual in two weeks to be able to go forward with IUD insertion. Patient agreeable and will be scheduled.  Discussed with patient as she has known before limited pelvic exam and recommend PUS prior to IUD insertion to make sure no concerns for  insertion and assess uterus and ovaries. Patient agreeable. Patient will be called with insurance info and scheduled.  Labs: GC,Chlamydia, Affirm, HIV  Pap smear as above not taken   counseled on breast self exam, STD prevention, HIV risk factors and prevention, family planning choices, adequate intake of calcium and vitamin D, diet and exercise  return annually or prn  An After Visit Summary was printed and given to the patient.

## 2015-11-21 LAB — WET PREP BY MOLECULAR PROBE
Candida species: NEGATIVE
GARDNERELLA VAGINALIS: POSITIVE — AB
Trichomonas vaginosis: NEGATIVE

## 2015-11-21 LAB — HIV ANTIBODY (ROUTINE TESTING W REFLEX): HIV: NONREACTIVE

## 2015-11-21 LAB — RPR TITER: RPR Titer: 1:1 {titer}

## 2015-11-21 LAB — RPR: RPR Ser Ql: REACTIVE — AB

## 2015-11-23 LAB — FLUORESCENT TREPONEMAL AB(FTA)-IGG-BLD: Fluorescent Treponemal ABS: REACTIVE — AB

## 2015-11-24 ENCOUNTER — Ambulatory Visit (INDEPENDENT_AMBULATORY_CARE_PROVIDER_SITE_OTHER): Payer: BLUE CROSS/BLUE SHIELD | Admitting: Obstetrics and Gynecology

## 2015-11-24 ENCOUNTER — Other Ambulatory Visit: Payer: Self-pay | Admitting: Obstetrics and Gynecology

## 2015-11-24 ENCOUNTER — Ambulatory Visit (INDEPENDENT_AMBULATORY_CARE_PROVIDER_SITE_OTHER): Payer: BLUE CROSS/BLUE SHIELD

## 2015-11-24 DIAGNOSIS — Z3009 Encounter for other general counseling and advice on contraception: Secondary | ICD-10-CM

## 2015-11-24 DIAGNOSIS — Z30431 Encounter for routine checking of intrauterine contraceptive device: Secondary | ICD-10-CM | POA: Diagnosis not present

## 2015-11-24 DIAGNOSIS — Z30430 Encounter for insertion of intrauterine contraceptive device: Secondary | ICD-10-CM

## 2015-11-24 LAB — POCT URINE PREGNANCY: PREG TEST UR: NEGATIVE

## 2015-11-24 LAB — IPS N GONORRHOEA AND CHLAMYDIA BY PCR

## 2015-11-24 NOTE — Patient Instructions (Signed)

## 2015-11-24 NOTE — Progress Notes (Signed)
GYNECOLOGY  VISIT   HPI: 35 y.o.   Divorced  Caucasian  female   [phone] with Patient's last menstrual period was 11/11/2015.   here for GYN ultrasound and mirena IUD insertion. The patient has an elevated BMI which limits her exam, Mrs. Darcel Bayley sent her for ultrasound evaluation. The patient also has irregular cycles (normal labs) and would benefit from endometrial protection and contraception. She is not currently sexually active. She is interested in the mirena IUD.   GYNECOLOGIC HISTORY: Patient's last menstrual period was 11/11/2015. Contraception: not currently active Menopausal hormone therapy: NA        OB History    Gravida Para Term Preterm AB TAB SAB Ectopic Multiple Living   Patient Active Problem List   Diagnosis Date Noted  . Fetal macrosomia in pregnancy in third trimester, antepartum 07/11/2013  . Chronic hypertension complicating or reason for care during pregnancy 06/24/2013  . Hypersomnia 06/24/2013  . Diabetes mellitus, antepartum(648.03) 02/05/2013  . Obesity complicating peripregnancy, antepartum 02/05/2013  . Supervision of high-risk pregnancy 01/29/2013  . Diabetes (HCC) 01/01/2013  . Pregnancy with history of ectopic pregnancy in first trimester 01/01/2013    Past Medical History  Diagnosis Date  . Abnormal Pap smear 2005    ASCUS +HPV  . Genital warts   . Diabetes mellitus without complication (HCC)   . Hypertension   . Syphilis in female   . Anxiety and depression   . Anxiety   . Depression     Past Surgical History  Procedure Laterality Date  . Cholecystectomy  6/10    with liver infection, also occluded bile duct    Current Outpatient Prescriptions  Medication Sig Dispense Refill  . ACCU-CHEK FASTCLIX LANCETS MISC 1 Units by Percutaneous route 4 (four) times daily. 100 each 12  . ALPRAZolam (XANAX) 0.5 MG tablet Take 0.5 mg by mouth.    Marland Kitchen amLODipine (NORVASC) 5 MG tablet Take by mouth.    Marland Kitchen  atorvastatin (LIPITOR) 10 MG tablet Take 10 mg by mouth.    . Dulaglutide (TRULICITY) 0.75 MG/0.5ML SOPN Inject into the skin.    Marland Kitchen glucose blood (BAYER CONTOUR TEST) test strip Check blood sugar level 4 times daily AM and 2 hours PP 100 each 12  . Insulin Detemir (LEVEMIR FLEXPEN) 100 UNIT/ML Pen Inject into the skin.    Marland Kitchen lisinopril-hydrochlorothiazide (PRINZIDE,ZESTORETIC) 10-12.5 MG per tablet Take by mouth.    . pantoprazole (PROTONIX) 40 MG tablet Take 40 mg by mouth.    . sertraline (ZOLOFT) 100 MG tablet Take 150 mg by mouth.    . SitaGLIPtin-MetFORMIN HCl (JANUMET XR) 50-1000 MG TB24 Take by mouth.     No current facility-administered medications for this visit.     ALLERGIES: Neosporin   Family History  Problem Relation Age of Onset  . Cancer Father     lung  . Hypertension Father   . Stroke Father   . Heart disease Father   . Hypertension Mother   . Diabetes Paternal Grandmother   . Cancer Paternal Grandfather     prostate    Social History   Social History  . Marital Status: Divorced    Spouse Name: N/A  . Number of Children: N/A  . Years of Education: N/A   Occupational History  . Not on file.   Social History Main Topics  . Smoking status: Current  Every Day Smoker    Types: Cigarettes    Last Attempt to Quit: 12/02/2012  . Smokeless tobacco: Never Used     Comment: 5 a day  . Alcohol Use: No  . Drug Use: No  . Sexual Activity:    Partners: Male    Birth Control/ Protection:    Other Topics Concern  . Not on file   Social History Narrative    ROS  PHYSICAL EXAMINATION:    BP 122/84 mmHg  Pulse 80  Resp 20  Wt 371 lb 9.6 oz (168.557 kg)  LMP 11/11/2015    General appearance: alert, cooperative and appears stated age Pelvic: External genitalia:  no lesions              Urethra:  normal appearing urethra with no masses, tenderness or lesions              Bartholins and Skenes: normal                 Vagina: normal appearing vagina with  normal color and discharge, no lesions              Cervix no lesions  The risks of the mirena IUD were reviewed with the patient, including infection, abnormal bleeding and uterine perfortion. Consent was signed.  A speculum was placed in the vagina, the cervix was cleansed with betadine. A tenaculum was placed on the cervix, the uterus sounded to 9 cm.  The mirena IUD was inserted without difficulty with ultrasound guidance. The string were cut to 3-4 cm. The tenaculum was removed.   The patient tolerated the procedure very well.    Chaperone was present for exam.  ASSESSMENT Mirena IUD insertion Recent negative genprobe  PLAN F/U in 1 month Call with any concerns Condoms if sexually active   An After Visit Summary was printed and given to the patient.  Discussed the patient's ultrasound result, counseled about the IUD

## 2015-11-26 ENCOUNTER — Telehealth: Payer: Self-pay | Admitting: Certified Nurse Midwife

## 2015-11-26 DIAGNOSIS — R899 Unspecified abnormal finding in specimens from other organs, systems and tissues: Secondary | ICD-10-CM

## 2015-11-26 DIAGNOSIS — B9689 Other specified bacterial agents as the cause of diseases classified elsewhere: Secondary | ICD-10-CM

## 2015-11-26 DIAGNOSIS — N760 Acute vaginitis: Principal | ICD-10-CM

## 2015-11-26 MED ORDER — METRONIDAZOLE 0.75 % VA GEL: VAGINAL | Status: DC

## 2015-11-26 NOTE — Telephone Encounter (Signed)
Reviewed labs with patient regarding Syphilis titer and reactivity. Discussed no indication for treatment at this time. Discussed finding of BV on affirm and need for treatment. Patient instructed Metrogel Rx will be sent to [facility] on [address]. Given lab results of negative GC,Chlamydia and HIV negative. Questions addressed.

## 2015-11-30 ENCOUNTER — Other Ambulatory Visit: Payer: Self-pay | Admitting: Certified Nurse Midwife

## 2015-11-30 DIAGNOSIS — R899 Unspecified abnormal finding in specimens from other organs, systems and tissues: Secondary | ICD-10-CM

## 2015-12-01 ENCOUNTER — Telehealth: Payer: Self-pay

## 2015-12-01 NOTE — Telephone Encounter (Signed)
Spoke with Denyse [facility]ass[facility]. Advised of message as seen below from PepsiCoDeborah Leonard CNM. He is agreeable and verbalizes understanding.  Routing to provider for final review. Patient agreeable to disposition. Will close encounter.

## 2015-12-01 NOTE — Telephone Encounter (Signed)
Patient was treated for Syphilis several years before( in paper chart) twice and was followed. She also was follow here and should have a record of our checking with them regarding this.

## 2015-12-01 NOTE — Telephone Encounter (Signed)
Spoke with Denyse Amassorey from the [facility] at time of incoming call. Denyse AmassCorey is asking if the patient has a history of ever having Syphilis. Advised she does have a prior history per chart. Asking if we treated her for her positive testing from 11/20/2015. Advised she was not treated at that time. Denyse AmassCorey is asking if this is due to the patient's prior history. Advised I will need to verify this with the provider and return call. He is agreeable. Return phone number is [phone].

## 2015-12-07 ENCOUNTER — Telehealth: Payer: Self-pay | Admitting: Obstetrics and Gynecology

## 2015-12-07 ENCOUNTER — Other Ambulatory Visit: Payer: Self-pay | Admitting: Certified Nurse Midwife

## 2015-12-07 DIAGNOSIS — R899 Unspecified abnormal finding in specimens from other organs, systems and tissues: Secondary | ICD-10-CM

## 2015-12-07 NOTE — Telephone Encounter (Signed)
Please make sure the patient has a syphilis titer and FTAB when she returns in a month. I don't see this documented in her chart. Thanks

## 2015-12-20 ENCOUNTER — Inpatient Hospital Stay (HOSPITAL_COMMUNITY): Payer: BLUE CROSS/BLUE SHIELD

## 2015-12-20 ENCOUNTER — Encounter (HOSPITAL_COMMUNITY): Payer: Self-pay | Admitting: *Deleted

## 2015-12-20 ENCOUNTER — Inpatient Hospital Stay (HOSPITAL_COMMUNITY)
Admission: AD | Admit: 2015-12-20 | Discharge: 2015-12-20 | Disposition: A | Discharge status: Home / Self Care | Payer: BLUE CROSS/BLUE SHIELD | Source: Ambulatory Visit | Attending: Obstetrics and Gynecology | Admitting: Obstetrics and Gynecology

## 2015-12-20 ENCOUNTER — Telehealth: Payer: Self-pay | Admitting: Obstetrics and Gynecology

## 2015-12-20 DIAGNOSIS — E119 Type 2 diabetes mellitus without complications: Secondary | ICD-10-CM | POA: Diagnosis not present

## 2015-12-20 DIAGNOSIS — Z7984 Long term (current) use of oral hypoglycemic drugs: Secondary | ICD-10-CM | POA: Diagnosis not present

## 2015-12-20 DIAGNOSIS — F419 Anxiety disorder, unspecified: Secondary | ICD-10-CM | POA: Insufficient documentation

## 2015-12-20 DIAGNOSIS — I10 Essential (primary) hypertension: Secondary | ICD-10-CM | POA: Insufficient documentation

## 2015-12-20 DIAGNOSIS — F17210 Nicotine dependence, cigarettes, uncomplicated: Secondary | ICD-10-CM | POA: Diagnosis not present

## 2015-12-20 DIAGNOSIS — R102 Pelvic and perineal pain: Secondary | ICD-10-CM | POA: Diagnosis not present

## 2015-12-20 DIAGNOSIS — Z8249 Family history of ischemic heart disease and other diseases of the circulatory system: Secondary | ICD-10-CM | POA: Insufficient documentation

## 2015-12-20 DIAGNOSIS — F329 Major depressive disorder, single episode, unspecified: Secondary | ICD-10-CM | POA: Insufficient documentation

## 2015-12-20 DIAGNOSIS — Z30431 Encounter for routine checking of intrauterine contraceptive device: Secondary | ICD-10-CM | POA: Diagnosis not present

## 2015-12-20 DIAGNOSIS — Z823 Family history of stroke: Secondary | ICD-10-CM | POA: Diagnosis not present

## 2015-12-20 DIAGNOSIS — Z79899 Other long term (current) drug therapy: Secondary | ICD-10-CM | POA: Insufficient documentation

## 2015-12-20 LAB — URINALYSIS, ROUTINE W REFLEX MICROSCOPIC
BILIRUBIN URINE: NEGATIVE
Glucose, UA: NEGATIVE mg/dL
KETONES UR: NEGATIVE mg/dL
Leukocytes, UA: NEGATIVE
NITRITE: NEGATIVE
Protein, ur: NEGATIVE mg/dL
Specific Gravity, Urine: 1.025 (ref 1.005–1.030)
pH: 5.5 (ref 5.0–8.0)

## 2015-12-20 LAB — POCT PREGNANCY, URINE: Preg Test, Ur: NEGATIVE

## 2015-12-20 LAB — URINE MICROSCOPIC-ADD ON

## 2015-12-20 LAB — WET PREP, GENITAL
Clue Cells Wet Prep HPF POC: NONE SEEN
Sperm: NONE SEEN
TRICH WET PREP: NONE SEEN
YEAST WET PREP: NONE SEEN

## 2015-12-20 MED ORDER — IBUPROFEN 600 MG PO TABS
600.0000 mg | ORAL_TABLET | Freq: Once | ORAL | Status: AC
  Administered 2015-12-20: 600 mg via ORAL
  Filled 2015-12-20: qty 1

## 2015-12-20 MED ORDER — IBUPROFEN 800 MG PO TABS: 800.0000 mg | ORAL_TABLET | Freq: Three times a day (TID) | ORAL | Status: DC

## 2015-12-20 NOTE — MAU Note (Addendum)
Pt states she is having IUD pain that she describes as a sharp and cramping pain.  Pt states she is having nausea.  Pt states she feels like her cervix is burning and having sharp pains.  IUD placed 11/24/15.

## 2015-12-20 NOTE — MAU Provider Note (Signed)
History     CSN: [id number]  Arrival date and time: 12/20/15 1546   None     Chief Complaint  Patient presents with  . Other   HPI   Ms.Diamond Pace is a 35 y.o. non pregnant female [id number] presenting to MAU with pelvic pain. She had an IUD placed at the beginning of the month; following the procedure she did not have any problems. The started on Friday and has progressively gotten worse. She woke up today and the pain was worse. The pain worsens with movement.  The pain is worse in the pelvis, which radiates all throughout her lower body. Has had bleeding since the insertion.   She feels burning and a poking sensation in her vagina since the IUD was inserted   Nothing for pain taken prior to her arrival. She is not interested in pain medicine.   No recent intercourse Denies fever.   OB History    Gravida Para Term Preterm AB TAB SAB Ectopic Multiple Living   Past Medical History  Diagnosis Date  . Abnormal Pap smear 2005    ASCUS +HPV  . Genital warts   . Diabetes mellitus without complication (HCC)   . Hypertension   . Syphilis in female   . Anxiety and depression   . Anxiety   . Depression     Past Surgical History  Procedure Laterality Date  . Cholecystectomy  6/10    with liver infection, also occluded bile duct    Family History  Problem Relation Age of Onset  . Cancer Father     lung  . Hypertension Father   . Stroke Father   . Heart disease Father   . Hypertension Mother   . Diabetes Paternal Grandmother   . Cancer Paternal Grandfather     prostate    Social History  Substance Use Topics  . Smoking status: Current Every Day Smoker    Types: Cigarettes    Last Attempt to Quit: 12/02/2012  . Smokeless tobacco: Never Used     Comment: 5 a day  . Alcohol Use: No    Allergies:  Allergies  Allergen Reactions  . Neosporin [Neomycin-Polymyxin-Gramicidin] Dermatitis and Rash    Prescriptions prior to  admission  Medication Sig Dispense Refill Last Dose  . ALPRAZolam (XANAX) 0.5 MG tablet Take 0.5 mg by mouth as needed for anxiety.    Past Month at Unknown time  . amLODipine (NORVASC) 5 MG tablet Take by mouth.   12/20/2015 at Unknown time  . atorvastatin (LIPITOR) 10 MG tablet Take 10 mg by mouth daily.    12/20/2015 at Unknown time  . Dulaglutide (TRULICITY) 0.75 MG/0.5ML SOPN Inject into the skin.   Past Week at Unknown time  . lisinopril-hydrochlorothiazide (PRINZIDE,ZESTORETIC) 10-12.5 MG per tablet Take 1 tablet by mouth daily.    12/20/2015 at Unknown time  . pantoprazole (PROTONIX) 40 MG tablet Take 40 mg by mouth daily.    12/19/2015 at Unknown time  . sertraline (ZOLOFT) 100 MG tablet Take 150 mg by mouth at bedtime.    12/19/2015 at Unknown time  . SitaGLIPtin-MetFORMIN HCl (JANUMET XR) 50-1000 MG TB24 Take 1 tablet by mouth 2 (two) times daily.    12/20/2015 at Unknown time  . varenicline (CHANTIX PAK) 0.5 MG X 11 & 1 MG X 42 tablet Take by mouth 2 (two) times daily. Take one 0.5 mg tablet by mouth  once daily for 3 days, then increase to one 0.5 mg tablet twice daily for 4 days, then increase to one 1 mg tablet twice daily.   12/20/2015 at Unknown time  . ACCU-CHEK FASTCLIX LANCETS MISC 1 Units by Percutaneous route 4 (four) times daily. 100 each 12 Taking  . glucose blood (BAYER CONTOUR TEST) test strip Check blood sugar level 4 times daily AM and 2 hours PP 100 each 12 Taking  . Insulin Detemir (LEVEMIR FLEXPEN) 100 UNIT/ML Pen Inject into the skin.   Taking  . metroNIDAZOLE (METROGEL) 0.75 % vaginal gel At hs for 5 nights vaginally (Patient not taking: Reported on 12/20/2015) 70 g 0 Not Taking at Unknown time   Results for orders placed or performed during the hospital encounter of 12/20/15 (from the past 48 hour(s))  Urinalysis, Routine w reflex microscopic (not at [facility])     Status: Abnormal   Collection Time: 12/20/15  3:56 PM  Result Value Ref Range   Color, Urine YELLOW YELLOW    APPearance CLEAR CLEAR   Specific Gravity, Urine 1.025 1.005 - 1.030   pH 5.5 5.0 - 8.0   Glucose, UA NEGATIVE NEGATIVE mg/dL   Hgb urine dipstick MODERATE (A) NEGATIVE   Bilirubin Urine NEGATIVE NEGATIVE   Ketones, ur NEGATIVE NEGATIVE mg/dL   Protein, ur NEGATIVE NEGATIVE mg/dL   Nitrite NEGATIVE NEGATIVE   Leukocytes, UA NEGATIVE NEGATIVE  Urine microscopic-add on     Status: Abnormal   Collection Time: 12/20/15  3:56 PM  Result Value Ref Range   Squamous Epithelial / LPF 0-5 (A) NONE SEEN   WBC, UA 0-5 0 - 5 WBC/hpf   RBC / HPF 6-30 0 - 5 RBC/hpf   Bacteria, UA RARE (A) NONE SEEN  Pregnancy, urine POC     Status: None   Collection Time: 12/20/15  4:14 PM  Result Value Ref Range   Preg Test, Ur NEGATIVE NEGATIVE    Comment:        THE SENSITIVITY OF THIS METHODOLOGY IS >24 mIU/mL   Wet prep, genital     Status: Abnormal   Collection Time: 12/20/15  4:54 PM  Result Value Ref Range   Yeast Wet Prep HPF POC NONE SEEN NONE SEEN   Trich, Wet Prep NONE SEEN NONE SEEN   Clue Cells Wet Prep HPF POC NONE SEEN NONE SEEN   WBC, Wet Prep HPF POC FEW (A) NONE SEEN    Comment: FEW BACTERIA SEEN   Sperm NONE SEEN    [place] Transvaginal Non-ob  12/20/2015  CLINICAL DATA:  Check IUD, history of recent placement on 11/24/2015, right lower quadrant pain EXAM: TRANSABDOMINAL AND TRANSVAGINAL ULTRASOUND OF PELVIS TECHNIQUE: Both transabdominal and transvaginal ultrasound examinations of the pelvis were performed. Transabdominal technique was performed for global imaging of the pelvis including uterus, ovaries, adnexal regions, and pelvic cul-de-sac. It was necessary to proceed with endovaginal exam following the transabdominal exam to visualize the ovaries. COMPARISON:  None FINDINGS: Uterus Measurements: 8.3 x 4.2 x 5.0 cm. No fibroids or other mass visualized. Endometrium Thickness: 8 mm.  IUD is noted within the endometrial cavity. Right ovary Measurements: 2.1 x 1.6 x 1.7 cm. Normal appearance/no  adnexal mass. Left ovary Not visualized Other findings No abnormal free fluid. IMPRESSION: IUD noted within the uterus. No other focal abnormality is noted. The left ovary is not well visualized. Electronically Signed   By: Alcide CleverMark  Lukens M.D.   On: 12/20/2015 19:01   [place] Pelvis Complete  12/20/2015  CLINICAL DATA:  Check IUD, history of recent placement on 11/24/2015, right lower quadrant pain EXAM: TRANSABDOMINAL AND TRANSVAGINAL ULTRASOUND OF PELVIS TECHNIQUE: Both transabdominal and transvaginal ultrasound examinations of the pelvis were performed. Transabdominal technique was performed for global imaging of the pelvis including uterus, ovaries, adnexal regions, and pelvic cul-de-sac. It was necessary to proceed with endovaginal exam following the transabdominal exam to visualize the ovaries. COMPARISON:  None FINDINGS: Uterus Measurements: 8.3 x 4.2 x 5.0 cm. No fibroids or other mass visualized. Endometrium Thickness: 8 mm.  IUD is noted within the endometrial cavity. Right ovary Measurements: 2.1 x 1.6 x 1.7 cm. Normal appearance/no adnexal mass. Left ovary Not visualized Other findings No abnormal free fluid. IMPRESSION: IUD noted within the uterus. No other focal abnormality is noted. The left ovary is not well visualized. Electronically Signed   By: Alcide Clever M.D.   On: 12/20/2015 19:01    Review of Systems  Constitutional: Negative for fever and chills.  Gastrointestinal: Positive for abdominal pain. Negative for nausea, vomiting, diarrhea and constipation.  Genitourinary: Negative for dysuria and urgency.   Physical Exam   Blood pressure 112/54, pulse 82, temperature 98.4 F (36.9 C), temperature source Oral, resp. rate 16, last menstrual period 12/19/2015, unknown if currently breastfeeding.  Physical Exam  Constitutional: She is oriented to person, place, and time. She appears well-developed and well-nourished.  Non-toxic appearance. She does not have a sickly appearance. She does not  appear ill. No distress.  Morbidly obese female   Respiratory: Effort normal.  GI: Soft. Normal appearance. There is generalized tenderness. There is no rigidity, no rebound, no guarding and no CVA tenderness.  Genitourinary:  Speculum exam: Vagina - Small amount of dark red blood in the vagina.  no odor.  Cervix - mucus, blood discharge at cervix. IUD strings visualized in the vagina.  Bimanual exam: Cervix closed, No CMT. IUD strings palpated  Uterus non tender, normal size Adnexa non tender, no masses bilaterally Wet prep done Chaperone present for exam.  Musculoskeletal: Normal range of motion.  Neurological: She is alert and oriented to person, place, and time.  Skin: Skin is warm. She is not diaphoretic.  Psychiatric: Her behavior is normal.    MAU Course  Procedures  None  MDM  Denies the need for pain medication.  Wet prep GC  Ibuprofen 600 mg PO  Patient responded well to Ibuprofen. Rates her pain 2/10. Patient asking how much longer she would need to be here.  Discussed patient with Dr. Edward Jolly.  Pelvic US per Dr. Edward Jolly.   Pelvic US reviewed with Dr. Edward Jolly @ 1910; Ok to DC home.   Assessment and Plan    A:  1. IUD check up   2. Pelvic pain in female      P:  Discharge home in stable condition Patient to return if worsening pain, fever, N/V or increased vaginal bleeding Call the office on Tuesday to schedule follow up appointment Rx: Ibuprofen  Duane Lope, NP 12/20/2015 7:57 PM

## 2015-12-20 NOTE — Telephone Encounter (Signed)
Phone call from patient on call.   Patient called two hours ago reporting significant pelvic pain.  Status post Mirena IUD insertion with ultrasound guidance on 11/24/15.  I recommended Advil 800 mg and evaluation in the MAU at [facility].

## 2015-12-20 NOTE — Discharge Instructions (Signed)

## 2015-12-22 ENCOUNTER — Telehealth: Payer: Self-pay | Admitting: Obstetrics and Gynecology

## 2015-12-22 NOTE — Telephone Encounter (Signed)
Called to check on the patient. She had some pelvic pain and was seen in the ER this weekend. She had a negative UPT, normal pelvic ultrasound. IUD in place. She states she is just in mild discomfort. Declines an appointment prior to Friday. She has been instructed to call with worsening pain, fevers, or any other concerns.

## 2015-12-25 ENCOUNTER — Other Ambulatory Visit: Payer: Self-pay | Admitting: Certified Nurse Midwife

## 2015-12-25 ENCOUNTER — Encounter: Payer: Self-pay | Admitting: Certified Nurse Midwife

## 2015-12-25 ENCOUNTER — Ambulatory Visit (INDEPENDENT_AMBULATORY_CARE_PROVIDER_SITE_OTHER): Payer: BLUE CROSS/BLUE SHIELD | Admitting: Certified Nurse Midwife

## 2015-12-25 VITALS — BP 128/62 | HR 84 | Resp 18 | Ht 65.5 in | Wt 377.0 lb

## 2015-12-25 DIAGNOSIS — R899 Unspecified abnormal finding in specimens from other organs, systems and tissues: Secondary | ICD-10-CM

## 2015-12-25 DIAGNOSIS — Z30431 Encounter for routine checking of intrauterine contraceptive device: Secondary | ICD-10-CM | POA: Diagnosis not present

## 2015-12-25 NOTE — Patient Instructions (Signed)
Intrauterine Device Information An intrauterine device (IUD) is inserted into your uterus to prevent pregnancy. There are two types of IUDs available:   Copper IUD--This type of IUD is wrapped in copper wire and is placed inside the uterus. Copper makes the uterus and fallopian tubes produce a fluid that kills sperm. The copper IUD can stay in place for 10 years.  Hormone IUD--This type of IUD contains the hormone progestin (synthetic progesterone). The hormone thickens the cervical mucus and prevents sperm from entering the uterus. It also thins the uterine lining to prevent implantation of a fertilized egg. The hormone can weaken or kill the sperm that get into the uterus. One type of hormone IUD can stay in place for 5 years, and another type can stay in place for 3 years. Your health care provider will make sure you are a good candidate for a contraceptive IUD. Discuss with your health care provider the possible side effects.  ADVANTAGES OF AN INTRAUTERINE DEVICE  IUDs are highly effective, reversible, long acting, and low maintenance.   There are no estrogen-related side effects.   An IUD can be used when breastfeeding.   IUDs are not associated with weight gain.   The copper IUD works immediately after insertion.   The hormone IUD works right away if inserted within 7 days of your period starting. You will need to use a backup method of birth control for 7 days if the hormone IUD is inserted at any other time in your cycle.  The copper IUD does not interfere with your female hormones.   The hormone IUD can make heavy menstrual periods lighter and decrease cramping.   The hormone IUD can be used for 3 or 5 years.   The copper IUD can be used for 10 years. DISADVANTAGES OF AN INTRAUTERINE DEVICE  The hormone IUD can be associated with irregular bleeding patterns.   The copper IUD can make your menstrual flow heavier and more painful.   You may experience cramping and  vaginal bleeding after insertion.    This information is not intended to replace advice given to you by your health care provider. Make sure you discuss any questions you have with your health care provider.   Document Released: 06/14/2004 Document Revised: 03/13/2013 Document Reviewed: 12/30/2012 Elsevier Interactive Patient Education 2016 Else[employer] Inc[employer]  

## 2015-12-25 NOTE — Progress Notes (Signed)
[age] y.o. Married [place] female [phone] here for follow up Mirena IUD inserted 11/24/15. Patient has had some spotting off and on with use and some cramping, concerned this is not normal. Patient also worries about protection if she strains with bowel movement, she might push it out. Patient was seen in ER for cramping and PUS was done for placement and IUD was in place and no concerns with PUS. Labs for vaginal infection were also normal. Patient reassured after ER visit. Here also for follow up of positive RPR for repeat, which has be drawn today. No other health concerns today. Not sexually active.   O: Healthy WD,WN female Affect: normal, orientation x 3 Skin:warm and dry Abdomen:soft, non tender Pelvic exam:EXTERNAL GENITALIA: normal appearing vulva with no masses, tenderness or lesions VAGINA: no abnormal discharge or lesions and very scant pink discharge noted CERVIX: no lesions or cervical motion tenderness, no blood noted from cervix, IUD string appropriate length UTERUS: mid, non tender, normal, but limited by body habitus ADNEXA: no masses palpable and nontender, limited by body habitus  A:Normal pelvic exam Normal Mirena IUD surveillance RPR recheck  P: Discussed findings of normal pelvic exam and normal IUD string length. Questions addressed of expectations of bleeding profile and cramping with IUD during adjustment period. Discussed Advil use if needed. Questions addressed at length. Felt much better and reassured. Warning signs reviewed and if concerns needs to come in.  Lab: RPR,FTA  Rv prn aex  Labs   Instructions given regarding:  RV

## 2015-12-26 LAB — RPR: RPR: REACTIVE — AB

## 2015-12-26 LAB — RPR TITER

## 2015-12-28 LAB — FLUORESCENT TREPONEMAL AB(FTA)-IGG-BLD: FLUORESCENT TREPONEMAL ABS: REACTIVE — AB

## 2015-12-28 NOTE — Progress Notes (Signed)
Reviewed personally.  M. Suzanne Kimela Malstrom, MD.  

## 2015-12-30 ENCOUNTER — Ambulatory Visit: Payer: BLUE CROSS/BLUE SHIELD | Admitting: Obstetrics and Gynecology

## 2015-12-31 ENCOUNTER — Telehealth: Payer: Self-pay

## 2015-12-31 NOTE — Telephone Encounter (Signed)
-----   Message from Verner Choleborah S Leonard, CNM sent at 12/28/2015  7:54 PM EDT ----- Will need to notify [facility] of results of still RPR reactive, which we expected, FTA reactive and titer 1:1 no change.  Their recommendations?  She has no exposure per conversation on her last visit. Once we know their recommendations patient can be notified. She has been 1:1 before with no change.

## 2015-12-31 NOTE — Telephone Encounter (Signed)
Left message for Gearldine BienenstockBrandy [facility] for return call to office regarding recommendations.

## 2015-12-31 NOTE — Telephone Encounter (Signed)
Spoke with Gearldine BienenstockBrandy at the [facility]. Advised of results as seen below from PepsiCoDeborah Leonard CNM. Gearldine BienenstockBrandy states that due to patient's prior history and treatment, no new exposure, ratio of 1:1, and no current symptoms nothing further needs to be done at this time. Gearldine BienenstockBrandy states that the state has closed the patient's case after her results from 11/20/2015. Gearldine BienenstockBrandy states that results do not need to be sent to the [facility] at this time. States that if the patients ratio returns with a 2 fold change that is when results will need to be reported and treatment will be needed.  Spoke with patient. Advised of results as seen below from Leota Sauerseborah Leonard CNM and recommendations as seen above from the [facility]. She is agreeable and verbalizes understanding.   Routing to provider for final review. Patient agreeable to disposition. Will close encounter.

## 2016-01-01 ENCOUNTER — Ambulatory Visit (INDEPENDENT_AMBULATORY_CARE_PROVIDER_SITE_OTHER): Payer: BLUE CROSS/BLUE SHIELD | Admitting: Certified Nurse Midwife

## 2016-01-01 ENCOUNTER — Ambulatory Visit: Payer: BLUE CROSS/BLUE SHIELD | Admitting: Certified Nurse Midwife

## 2016-01-01 ENCOUNTER — Encounter: Payer: Self-pay | Admitting: Certified Nurse Midwife

## 2016-01-01 VITALS — BP 114/72 | HR 80 | Resp 16 | Wt 374.0 lb

## 2016-01-01 DIAGNOSIS — Z113 Encounter for screening for infections with a predominantly sexual mode of transmission: Secondary | ICD-10-CM | POA: Diagnosis not present

## 2016-01-01 DIAGNOSIS — B373 Candidiasis of vulva and vagina: Secondary | ICD-10-CM | POA: Diagnosis not present

## 2016-01-01 DIAGNOSIS — A499 Bacterial infection, unspecified: Secondary | ICD-10-CM | POA: Diagnosis not present

## 2016-01-01 DIAGNOSIS — B3731 Acute candidiasis of vulva and vagina: Secondary | ICD-10-CM

## 2016-01-01 DIAGNOSIS — B9689 Other specified bacterial agents as the cause of diseases classified elsewhere: Secondary | ICD-10-CM

## 2016-01-01 DIAGNOSIS — N760 Acute vaginitis: Secondary | ICD-10-CM

## 2016-01-01 MED ORDER — FLUCONAZOLE 150 MG PO TABS: ORAL_TABLET | ORAL | Status: DC

## 2016-01-01 NOTE — Progress Notes (Signed)
35 y.o. Divorced Hispanic female [phone] here with complaint of vaginal symptoms of itching, burning, and increase discharge. Describes discharge as white slightly watery, and slight odor..Onset of symptoms 2 days ago. Denies new personal products or vaginal dryness. [place] and was sexually active for the first time in several months. Did not use any lubrication and has been very irritated since last sexual activity. Still has tube of Metrogel she had before in date that she has not used.Desires STD screening. Urinary symptoms none . Contraception is Mirena IUD   O:Healthy female WDWN Affect: normal, orientation x 3  Exam: Abdomen:soft, non  tender Lymph node: no enlargement or tenderness Pelvic exam: External genital: normal female, slightly swollen BUS: negative Vagina: white watery discharge noted. Ph: 5.0  ,Wet prep taken, Affirm taken Cervix: normal, non tender, no CMT Uterus: normal, non tender Adnexa:normal, non tender, no masses or fullness noted   Wet Prep results:Clue positive, yeast positive   A:Normal pelvic exam BV Yeast vaginitis STD screening   P:Discussed findings of BV and Yeast and etiology. Discussed Aveeno or baking soda sitz bath for comfort. Avoid moist clothes or pads for extended period of time.   Coconut Oil use for skin protection prior to activity can be used to external skin for protection or dryness. Lab: Enid SkeensGC,Chlamydia, Affirm Rx: Diflucan see order, patient has not been taking Lipitor and reviewed warning with medication use. Will not start again until completes Diflucan. Patient will use the Metrogel bid x 5 days. Questions addressed. No sexual activity until treatment completed.  Rv prn

## 2016-01-01 NOTE — Progress Notes (Signed)
Reviewed personally.  M. Suzanne Laycee Fitzsimmons, MD.  

## 2016-01-02 LAB — WET PREP BY MOLECULAR PROBE
CANDIDA SPECIES: POSITIVE — AB
GARDNERELLA VAGINALIS: NEGATIVE
TRICHOMONAS VAG: NEGATIVE

## 2016-01-05 ENCOUNTER — Ambulatory Visit: Payer: BLUE CROSS/BLUE SHIELD | Admitting: Obstetrics and Gynecology

## 2016-01-06 LAB — IPS N GONORRHOEA AND CHLAMYDIA BY PCR

## 2016-02-12 ENCOUNTER — Ambulatory Visit (INDEPENDENT_AMBULATORY_CARE_PROVIDER_SITE_OTHER): Payer: BLUE CROSS/BLUE SHIELD | Admitting: Certified Nurse Midwife

## 2016-02-12 VITALS — BP 130/84 | HR 68 | Resp 14 | Ht 65.5 in | Wt 373.8 lb

## 2016-02-12 DIAGNOSIS — N9489 Other specified conditions associated with female genital organs and menstrual cycle: Secondary | ICD-10-CM

## 2016-02-12 DIAGNOSIS — Z30431 Encounter for routine checking of intrauterine contraceptive device: Secondary | ICD-10-CM

## 2016-02-12 DIAGNOSIS — N898 Other specified noninflammatory disorders of vagina: Secondary | ICD-10-CM

## 2016-02-12 NOTE — Progress Notes (Signed)
35 y.o. Divorced Hispanic female [phone] here with complaint of vaginal spotting for past month. Had a lot of clotting at beginning, not heavy bleeding, light.Now just light spotting off and on. Had IUD inserted 11/24/15. Denies new personal products or vaginal dryness. No STD concerns. Urinary symptoms n/a . Contraception is MIrena  IUD. Happy with choice, just getting used to what to expect with bleeding. Denies any pelvic pain, fever or abdominal pain. " Just want to be sure all normal". Not sexually active since last visit with negative STD screening.   O:  Healthy female WDWN Affect: normal, orientation x 3  Exam: Abdomen:soft, non tender Lymph node: no enlargement or tenderness Pelvic exam: External genital: normal female BUS: negative Vagina: scant blood with blood like odor only  Noted. Wet prep taken,  Affirm taken if unable to confirm with wet prep. Ph 4.0 Cervix: normal, non tender, no CMT,IUD appropriate length Uterus: normal, non tender, difficult to palpate with body habitus Adnexa:normal, non tender, no masses or fullness noted, difficult to palpate due to body habitus, but no large masses or tenderness   KOH, Saline negative for pathogens, RBC's noted   A: Normal pelvic exam with normal IUD surveillance  Negative wet prep, Affirm to confirm due to RBC's on wet prep.    P: Discussed findings of normal exam and no bacteria  Noted with wet prep. Discussed Aveeno or baking soda wash bath for comfort, after period to remove odor. Instructions given. Discussed warning signs with IUD and need to advise. Discussed bleeding appearance expectations with IUD. Questions addressed. Feels much better and discussing. Will treat per affirm if indicated  Rv prn

## 2016-02-13 LAB — WET PREP BY MOLECULAR PROBE
Candida species: NEGATIVE
Gardnerella vaginalis: POSITIVE — AB
Trichomonas vaginosis: NEGATIVE

## 2016-02-15 ENCOUNTER — Other Ambulatory Visit: Payer: Self-pay | Admitting: Certified Nurse Midwife

## 2016-02-15 DIAGNOSIS — N760 Acute vaginitis: Principal | ICD-10-CM

## 2016-02-15 DIAGNOSIS — B9689 Other specified bacterial agents as the cause of diseases classified elsewhere: Secondary | ICD-10-CM

## 2016-02-15 MED ORDER — METRONIDAZOLE 500 MG PO TABS: 500.0000 mg | ORAL_TABLET | Freq: Two times a day (BID) | ORAL | 0 refills | Status: AC

## 2016-02-22 NOTE — Progress Notes (Signed)
Encounter reviewed Karee Christopherson, MD   

## 2016-04-19 ENCOUNTER — Telehealth: Payer: Self-pay | Admitting: Certified Nurse Midwife

## 2016-04-19 NOTE — Telephone Encounter (Signed)
Patient has a Mirena and has been spotting after intercourse and is asking if this is normal?

## 2016-04-19 NOTE — Telephone Encounter (Signed)
Spoke with patient. Patient desires OV. Patient scheduled for OV 04/21/16 at 12:45 with Leota Sauerseborah Leonard.   Routing to provider for final review. Patient is agreeable to disposition. Will close encounter.

## 2016-04-19 NOTE — Telephone Encounter (Signed)
Spoke with patient. Patient states she had Mirena IUD placed 11/24/15. Patient reports increased spotting and pain after intercourse since placement of IUD. Patient reports abdominal cramps after intercourse that have weakened in severity over time. Patient states last had intercourse 04/17/16; currently reports "light spotting" , reports "pink" when wiping. Patient states this was not an issue prior to IUD and reports that her partner is of average size. Patient reports LMP lasted a day, 03/25/16. Patient asking if this is normal? Patient states she is no longer using protection with current partner and is concerned about her history of syphilis and transmitting during intercourse and bleeding. Partner is aware of syphilis and is concerned about his exposure. Advised partner to be tested and use of protection. Advised would review with Leota Sauerseborah Leonard and return call for recommendations. Patient is agreeable.   Leota Sauerseborah Leonard, please advise?

## 2016-04-22 ENCOUNTER — Ambulatory Visit (INDEPENDENT_AMBULATORY_CARE_PROVIDER_SITE_OTHER): Payer: BLUE CROSS/BLUE SHIELD | Admitting: Certified Nurse Midwife

## 2016-04-22 ENCOUNTER — Encounter: Payer: Self-pay | Admitting: Certified Nurse Midwife

## 2016-04-22 VITALS — BP 110/74 | HR 70 | Resp 16 | Ht 65.5 in | Wt 367.0 lb

## 2016-04-22 DIAGNOSIS — Z113 Encounter for screening for infections with a predominantly sexual mode of transmission: Secondary | ICD-10-CM | POA: Diagnosis not present

## 2016-04-22 DIAGNOSIS — Z124 Encounter for screening for malignant neoplasm of cervix: Secondary | ICD-10-CM

## 2016-04-22 DIAGNOSIS — Z30431 Encounter for routine checking of intrauterine contraceptive device: Secondary | ICD-10-CM | POA: Diagnosis not present

## 2016-04-22 NOTE — Progress Notes (Signed)
35 y.o. Divorced Hispanic female [phone] here for IUD surveillance and STD screening. Patient is no longer involved with her ex husband and has a new partner who plans to be with her long term. Patient desires STD screening. Periods scant to none or spotting with periods. Aware that is normal bleeding profile with IUD. Wants IUD string check to see if normal length, partner thought he felt it. No other concerns today.  O: Healthy WD,WN female Affect: normal, orientation x 3 Skin:warm and dry Abdomen:soft,non tender  Pelvic exam:EXTERNAL GENITALIA: normal appearing vulva with no masses, tenderness or lesions VAGINA: no abnormal discharge or lesions CERVIX: no lesions or cervical motion tenderness and no  Blood noted except from Pap smear IUD string noted appropriate length, offered to trim, patient declined UTERUS: normal, no large masses limited by body habitus ADNEXA: no masses palpable and nontender, limited by body habitus RECTUM: normal  A:Normal pelvic exam Obese Normal Mirena IUD surveillance STD screening Pap smear screening    P: Discussed findings of normal pelvic exam and limitations due to obesity, she is aware. IUD string noted appropriate Reassured. Warning signs of IUD given. Labs:HIV,Hep C, STD panel, affirm,GC,Chlamydia Pap smear taken with HPVHR    RV prn, aex

## 2016-04-22 NOTE — Patient Instructions (Signed)
Levonorgestrel intrauterine device (IUD) What is this medicine? LEVONORGESTREL IUD (LEE voe nor jes trel) is a contraceptive (birth control) device. The device is placed inside the uterus by a healthcare professional. It is used to prevent pregnancy and can also be used to treat heavy bleeding that occurs during your period. Depending on the device, it can be used for 3 to 5 years. This medicine may be used for other purposes; ask your health care provider or pharmacist if you have questions. What should I tell my health care provider before I take this medicine? They need to know if you have any of these conditions: -abnormal Pap smear -cancer of the breast, uterus, or cervix -diabetes -endometritis -genital or pelvic infection now or in the past -have more than one sexual partner or your partner has more than one partner -heart disease -history of an ectopic or tubal pregnancy -immune system problems -IUD in place -liver disease or tumor -problems with blood clots or take blood-thinners -use intravenous drugs -uterus of unusual shape -vaginal bleeding that has not been explained -an unusual or allergic reaction to levonorgestrel, other hormones, silicone, or polyethylene, medicines, foods, dyes, or preservatives -pregnant or trying to get pregnant -breast-feeding How should I use this medicine? This device is placed inside the uterus by a health care professional. Talk to your pediatrician regarding the use of this medicine in children. Special care may be needed. Overdosage: If you think you have taken too much of this medicine contact a poison control center or emergency room at once. NOTE: This medicine is only for you. Do not share this medicine with others. What if I miss a dose? This does not apply. What may interact with this medicine? Do not take this medicine with any of the following medications: -amprenavir -bosentan -fosamprenavir This medicine may also interact with  the following medications: -aprepitant -barbiturate medicines for inducing sleep or treating seizures -bexarotene -griseofulvin -medicines to treat seizures like carbamazepine, ethotoin, felbamate, oxcarbazepine, phenytoin, topiramate -modafinil -pioglitazone -rifabutin -rifampin -rifapentine -some medicines to treat HIV infection like atazanavir, indinavir, lopinavir, nelfinavir, tipranavir, ritonavir -St. John's wort -warfarin This list may not describe all possible interactions. Give your health care provider a list of all the medicines, herbs, non-prescription drugs, or dietary supplements you use. Also tell them if you smoke, drink alcohol, or use illegal drugs. Some items may interact with your medicine. What should I watch for while using this medicine? Visit your doctor or health care professional for regular check ups. See your doctor if you or your partner has sexual contact with others, becomes HIV positive, or gets a sexual transmitted disease. This product does not protect you against HIV infection (AIDS) or other sexually transmitted diseases. You can check the placement of the IUD yourself by reaching up to the top of your vagina with clean fingers to feel the threads. Do not pull on the threads. It is a good habit to check placement after each menstrual period. Call your doctor right away if you feel more of the IUD than just the threads or if you cannot feel the threads at all. The IUD may come out by itself. You may become pregnant if the device comes out. If you notice that the IUD has come out use a backup birth control method like condoms and call your health care provider. Using tampons will not change the position of the IUD and are okay to use during your period. What side effects may I notice from receiving this medicine?   Side effects that you should report to your doctor or health care professional as soon as possible: -allergic reactions like skin rash, itching or  hives, swelling of the face, lips, or tongue -fever, flu-like symptoms -genital sores -high blood pressure -no menstrual period for 6 weeks during use -pain, swelling, warmth in the leg -pelvic pain or tenderness -severe or sudden headache -signs of pregnancy -stomach cramping -sudden shortness of breath -trouble with balance, talking, or walking -unusual vaginal bleeding, discharge -yellowing of the eyes or skin Side effects that usually do not require medical attention (report to your doctor or health care professional if they continue or are bothersome): -acne -breast pain -change in sex drive or performance -changes in weight -cramping, dizziness, or faintness while the device is being inserted -headache -irregular menstrual bleeding within first 3 to 6 months of use -nausea This list may not describe all possible side effects. Call your doctor for medical advice about side effects. You may report side effects to FDA at 1-800-FDA-1088. Where should I keep my medicine? This does not apply. NOTE: This sheet is a summary. It may not cover all possible information. If you have questions about this medicine, talk to your doctor, pharmacist, or health care provider.    2016, Elsevier/Gold Standard. (2011-08-11 13:54:04)  

## 2016-04-22 NOTE — Progress Notes (Signed)
Encounter reviewed Talina Pleitez, MD   

## 2016-04-23 LAB — STD PANEL
HEP B S AG: NEGATIVE
HIV 1&2 Ab, 4th Generation: NONREACTIVE
RPR: REACTIVE — AB

## 2016-04-23 LAB — RPR TITER: RPR Titer: 1:2 {titer}

## 2016-04-23 LAB — WET PREP BY MOLECULAR PROBE
Candida species: NEGATIVE
Gardnerella vaginalis: POSITIVE — AB
Trichomonas vaginosis: NEGATIVE

## 2016-04-23 LAB — HEPATITIS C ANTIBODY: HCV AB: NEGATIVE

## 2016-04-25 LAB — FLUORESCENT TREPONEMAL AB(FTA)-IGG-BLD: FLUORESCENT TREPONEMAL ABS: REACTIVE — AB

## 2016-04-26 LAB — IPS N GONORRHOEA AND CHLAMYDIA BY PCR

## 2016-04-26 LAB — IPS PAP TEST WITH HPV

## 2016-04-27 ENCOUNTER — Other Ambulatory Visit: Payer: Self-pay | Admitting: Certified Nurse Midwife

## 2016-04-27 DIAGNOSIS — N760 Acute vaginitis: Principal | ICD-10-CM

## 2016-04-27 DIAGNOSIS — B9689 Other specified bacterial agents as the cause of diseases classified elsewhere: Secondary | ICD-10-CM

## 2016-04-27 MED ORDER — METRONIDAZOLE 500 MG PO TABS: 500.0000 mg | ORAL_TABLET | Freq: Two times a day (BID) | ORAL | 0 refills | Status: DC

## 2016-04-28 ENCOUNTER — Telehealth: Payer: Self-pay | Admitting: *Deleted

## 2016-05-04 NOTE — Telephone Encounter (Signed)
Opened in error. Closing encounter.

## 2016-10-25 ENCOUNTER — Encounter: Payer: Self-pay | Admitting: Certified Nurse Midwife

## 2016-10-25 ENCOUNTER — Ambulatory Visit (INDEPENDENT_AMBULATORY_CARE_PROVIDER_SITE_OTHER): Payer: BLUE CROSS/BLUE SHIELD | Admitting: Certified Nurse Midwife

## 2016-10-25 VITALS — BP 122/70 | HR 88 | Temp 98.0°F | Resp 16 | Wt 359.0 lb

## 2016-10-25 DIAGNOSIS — B9689 Other specified bacterial agents as the cause of diseases classified elsewhere: Secondary | ICD-10-CM | POA: Diagnosis not present

## 2016-10-25 DIAGNOSIS — Z3202 Encounter for pregnancy test, result negative: Secondary | ICD-10-CM

## 2016-10-25 DIAGNOSIS — N390 Urinary tract infection, site not specified: Secondary | ICD-10-CM

## 2016-10-25 DIAGNOSIS — N760 Acute vaginitis: Secondary | ICD-10-CM

## 2016-10-25 DIAGNOSIS — R300 Dysuria: Secondary | ICD-10-CM | POA: Diagnosis not present

## 2016-10-25 DIAGNOSIS — L723 Sebaceous cyst: Secondary | ICD-10-CM

## 2016-10-25 LAB — POCT URINALYSIS DIPSTICK
Bilirubin, UA: NEGATIVE
Blood, UA: NEGATIVE
Glucose, UA: 100
Ketones, UA: NEGATIVE
Nitrite, UA: NEGATIVE
Urobilinogen, UA: NEGATIVE
pH, UA: 5

## 2016-10-25 LAB — POCT URINE PREGNANCY: Preg Test, Ur: NEGATIVE

## 2016-10-25 MED ORDER — METRONIDAZOLE 0.75 % VA GEL: 1.0000 | Freq: Two times a day (BID) | VAGINAL | 0 refills | Status: DC

## 2016-10-25 MED ORDER — NITROFURANTOIN MONOHYD MACRO 100 MG PO CAPS: 100.0000 mg | ORAL_CAPSULE | Freq: Two times a day (BID) | ORAL | 0 refills | Status: DC

## 2016-10-25 NOTE — Progress Notes (Signed)
36 y.o. Divorced Hispanic female [phone] here with complaint of vaginal symptoms of itching, burning, and increase discharge for about one week. Also has noted urinary frequency and urgency. Some pain with urination. Denies back pain, fever or chills. Onset past 2 days. Describes discharge as mucus like discharge. Denies new personal products or vaginal dryness. No partner change. She does not have STD concerns.. Contraception is Mirena IUD. Patient is diabetic not in good control per patient. Currently wearing 24 hour glucose monitor. Sees specialist at [facility]. Has taken herself off her Lisinopril and Lipitor, feels she no longer needs them. Has not discussed with Endocrinology yet. No other health issues today.  ROS: Pertinent to HPI  O:Healthy female WDWN Affect: normal, orientation x 3  Exam:Skin warm and dry CVAT: negative bilateral Abdomen:positive suprapubic, soft, no masses  Axillary Lymph node: no enlargement or tenderness Pelvic exam: External genital: normal female with small sebaceous cyst noted on Mons Pubis, slightly tender, no redness or pustule noted BUS: negative  Bladder, urethral meatus tender, Urethra tender Vagina: watery odorous discharge noted. Ph: 5.0   ,Wet prep taken, Cervix: normal, non tender, no CMT, IUD string noted in vagina Uterus: normal, non tender exam limited by body habitus Adnexa:normal, non tender, no masses or fullness limited by body habitus    Wet Prep results: KOH,Saline + BV POCT Urine: WBC 2+, Glucose 100( normal for her)    A:Normal pelvic exam Contraception Mirena IUD UTI BV Sebaceous cyst in mons area resolving Type 2 Diabetes ? Control Hypertension no medication now   P:Discussed findings of UTI and BV and etiology. Discussed Aveeno or baking soda sitz bath for comfort and for sebaceous resolution.Discussed importance of emptying bladder prior to and after sexual activity, as this can increase risk of UTI. Also discussed importance of  follow up with Endocrinology and Glucose changes. Discussed increase of UTI with Diabetes also. Questions addressed at length. Feel she should stopping medications with MD prior due to untoward health issues could occur. Patient voiced understanding. Avoid moist clothes or pads for extended period of time.  Lab: Urine culture Rx Macrobid see order with instructions Rx Metrogel see order with instructions  Encouraged to follow up with MD as indicated.  Rv prn

## 2016-10-25 NOTE — Patient Instructions (Signed)
Infeccin urinaria en los adultos (Urinary Tract Infection, Adult) Una infeccin urinaria (IU) puede ocurrir en cualquier lugar de las vas urinarias. Las vas urinarias incluyen lo siguiente:  Riones.  Urteres.  Vejiga.  Uretra. Estos rganos fabrican, almacenan y eliminan la orina del organismo. CUIDADOS EN EL HOGAR  Tome los medicamentos de venta libre y los recetados solamente como se lo haya indicado el mdico.  Si le recetaron un antibitico, tmelo como se lo haya indicado el mdico. No deje de tomar los antibiticos aunque comience a sentirse mejor.  Evite beber lo siguiente:  Alcohol.  Cafena.  T.  Bebidas con gas.  Beba suficiente lquido para mantener el pis claro o de color amarillo plido.  Concurra a todas las visitas de control como se lo haya indicado el mdico. Esto es importante.  Asegrese de lo siguiente:  Vaciar la vejiga con frecuencia y en su totalidad. No contener la orina durante largos perodos.  Vaciar la vejiga antes y despus de tener relaciones sexuales.  Limpiar de adelante hacia atrs despus de defecar, si es mujer. Usar cada trozo de papel una vez cuando se limpie. SOLICITE AYUDA SI:  Siente dolor en la espalda.  Tiene fiebre.  Siente malestar estomacal (nuseas).  Vomita.  Los sntomas no mejoran despus de 3das de tratamiento.  Los sntomas desaparecen y luego reaparecen. SOLICITE AYUDA DE INMEDIATO SI:  Siente un dolor muy intenso en la espalda.  Siente un dolor muy intenso en la parte inferior del abdomen.  Tiene vmitos y no puede retener los medicamentos ni el agua. Esta informacin no tiene como fin reemplazar el consejo del mdico. Asegrese de hacerle al mdico cualquier pregunta que tenga. Document Released: 12/29/2009 Document Revised: 11/02/2015 Document Reviewed: 06/01/2015 Elsevier Interactive Patient Education  2017 Elsevier Inc.  

## 2016-10-26 LAB — URINE CULTURE

## 2016-10-31 NOTE — Progress Notes (Signed)
Encounter reviewed Cloris Flippo, MD   

## 2016-11-01 DIAGNOSIS — E782 Mixed hyperlipidemia: Secondary | ICD-10-CM | POA: Insufficient documentation

## 2016-11-01 DIAGNOSIS — E559 Vitamin D deficiency, unspecified: Secondary | ICD-10-CM | POA: Insufficient documentation

## 2016-11-01 DIAGNOSIS — F17200 Nicotine dependence, unspecified, uncomplicated: Secondary | ICD-10-CM | POA: Insufficient documentation

## 2016-11-22 NOTE — Progress Notes (Signed)
36 y.o. [id number] Divorced  Hispanic Fe here for annual exam. Contraception working well. No periods with Mirena IUD. Concerns with some discharge with use, but wondered if this was normal. Denies itching or odor. STD screening desired. Seeing diabetes specialist to increase her glucose control. Some medication changes, but seem to be slowly working. Has lost 22 pounds since last aex. Sees PCP for hypertension/anxiety/aex/labs. Still having social stress with custody battle over her 17 yo daughter, with ex spouse in [place]. Trying to work through with attorney help. No other health issues today.   No LMP recorded. Patient is not currently having periods (Reason: IUD).          Sexually active: Yes.    The current method of family planning is IUD.    Exercising: No.  exercise Smoker:  yes  Health Maintenance: Pap:  11-06-14 neg HPV HR neg, 04-22-16 neg HPV HR neg History of Abnormal Pap: yes MMG:  none Self Breast exams: no Colonoscopy:  none BMD:   none TDaP:  2014 Shingles: no Pneumonia: 2015 Hep C and HIV: both neg 9/17 Labs: none   reports that she has been smoking Cigarettes.  She has never used smokeless tobacco. She reports that she does not drink alcohol or use drugs.  Past Medical History:  Diagnosis Date  . Abnormal Pap smear 2005   ASCUS +HPV  . Anxiety   . Anxiety and depression   . Depression   . Diabetes mellitus without complication (HCC)   . Genital warts   . Hypertension   . Syphilis in female     Past Surgical History:  Procedure Laterality Date  . CHOLECYSTECTOMY  6/10   with liver infection, also occluded bile duct    Current Outpatient Prescriptions  Medication Sig Dispense Refill  . ACCU-CHEK FASTCLIX LANCETS MISC 1 Units by Percutaneous route 4 (four) times daily. 100 each 12  . amLODipine (NORVASC) 10 MG tablet Take 10 mg by mouth.    Marland Kitchen atorvastatin (LIPITOR) 10 MG tablet Take 10 mg by mouth daily.    . Dapagliflozin-Metformin HCl ER 11-998 MG TB24  Take by mouth.    . Dulaglutide 1.5 MG/0.5ML SOPN (Trulicity) Inject 1.5 mg weekly for diabetes    . glucose blood (BAYER CONTOUR TEST) test strip Check blood sugar level 4 times daily AM and 2 hours PP 100 each 12  . Insulin Degludec (TRESIBA FLEXTOUCH [place]) Inject into the skin. 12 units    . levonorgestrel (MIRENA) 20 MCG/24HR IUD by Intrauterine route.    Marland Kitchen lisinopril-hydrochlorothiazide (PRINZIDE,ZESTORETIC) 10-12.5 MG per tablet Take 1 tablet by mouth daily.     . pantoprazole (PROTONIX) 40 MG tablet Take 40 mg by mouth daily.     . pioglitazone (ACTOS) 30 MG tablet Take 1 tablet once a day for diabetes    . sertraline (ZOLOFT) 100 MG tablet Take 150 mg by mouth at bedtime.     Marland Kitchen atorvastatin (LIPITOR) 10 MG tablet Take 10 mg by mouth daily.      No current facility-administered medications for this visit.     Family History  Problem Relation Age of Onset  . Cancer Father     lung  . Hypertension Father   . Stroke Father   . Heart disease Father   . Hypertension Mother   . Diabetes Paternal Grandmother   . Cancer Paternal Grandfather     prostate    ROS:  Pertinent items are noted in HPI.  Otherwise, a comprehensive ROS  was negative.  Exam:   BP 118/76   Pulse 68   Resp 16   Ht 5' 5.5" (1.664 m)   Wt (!) 359 lb (162.8 kg)   BMI 58.83 kg/m  Height: 5' 5.5" (166.4 cm) Ht Readings from Last 3 Encounters:  11/23/16 5' 5.5" (1.664 m)  04/22/16 5' 5.5" (1.664 m)  02/12/16 5' 5.5" (1.664 m)    General appearance: alert, cooperative and appears stated age Head: Normocephalic, without obvious abnormality, atraumatic Neck: no adenopathy, supple, symmetrical, trachea midline and thyroid normal to inspection and palpation Lungs: clear to auscultation bilaterally Breasts: normal appearance, no masses or tenderness, No nipple retraction or dimpling, No nipple discharge or bleeding, No axillary or supraclavicular adenopathy Heart: regular rate and rhythm Abdomen: soft,  non-tender; no masses,  no organomegaly Extremities: extremities normal, atraumatic, no cyanosis or edema Skin: Skin color, texture, turgor normal. No rashes or lesions Lymph nodes: Cervical, supraclavicular, and axillary nodes normal. No abnormal inguinal nodes palpated Neurologic: Grossly normal   Pelvic: External genitalia:  no lesions              Urethra:  normal appearing urethra with no masses, tenderness or lesions              Bartholin's and Skene's: normal                 Vagina: normal appearing vagina with normal color and discharge, no lesions              Cervix: multiparous appearance, no cervical motion tenderness and no lesions, IUD string noted in cervical os              Pap taken: No. Bimanual Exam:  Uterus:  normal size, contour, position, consistency, mobility, non-tender and limited by body habitus              Adnexa: normal size, contour, position, consistency, mobility, non-tender and limited by body habitus               Rectovaginal: Confirms               Anus:  normal sphincter tone, no lesions  Chaperone present: yes  A:  Well Woman with normal exam  Contraception Mirena IUD due for removal 11/23/20  Screening labs  Diabetes/Hyperlipidemia/Hypertension/anxiety with PCP/Endocrine management  Morbid obesity on weight loss, down 22 pounds  Social stress with child custody  History of Syphillis with adequate treatment and titer follow up  P:   Reviewed health and wellness pertinent to exam  Reviewed normal discharge with IUD and warning signs that she needs to advise about  Labs: HIV,Hep C, RPR,Hep B, Affirm, GC,Chlamydia  Continue follow up with MD as  Indicated, stress importance of diabetes control for future health.  Encouraged to continue her journey with weight loss  Seek family and friend support as needed.  Pap smear: no  counseled on breast self exam, STD prevention, HIV risk factors and prevention, adequate intake of calcium and vitamin D, diet  and exercise  return annually or prn  An After Visit Summary was printed and given to the patient.

## 2016-11-23 ENCOUNTER — Ambulatory Visit (INDEPENDENT_AMBULATORY_CARE_PROVIDER_SITE_OTHER): Payer: BLUE CROSS/BLUE SHIELD | Admitting: Certified Nurse Midwife

## 2016-11-23 ENCOUNTER — Encounter: Payer: Self-pay | Admitting: Certified Nurse Midwife

## 2016-11-23 VITALS — BP 118/76 | HR 68 | Resp 16 | Ht 65.5 in | Wt 359.0 lb

## 2016-11-23 DIAGNOSIS — Z3049 Encounter for surveillance of other contraceptives: Secondary | ICD-10-CM | POA: Diagnosis not present

## 2016-11-23 DIAGNOSIS — Z0000 Encounter for general adult medical examination without abnormal findings: Secondary | ICD-10-CM | POA: Diagnosis not present

## 2016-11-23 DIAGNOSIS — Z01419 Encounter for gynecological examination (general) (routine) without abnormal findings: Secondary | ICD-10-CM

## 2016-11-23 DIAGNOSIS — Z8619 Personal history of other infectious and parasitic diseases: Secondary | ICD-10-CM

## 2016-11-23 NOTE — Patient Instructions (Signed)

## 2016-11-24 LAB — STD PANEL
HEP B S AG: NEGATIVE
HIV 1&2 Ab, 4th Generation: NONREACTIVE

## 2016-11-24 LAB — GC/CHLAMYDIA PROBE AMP
CT Probe RNA: NOT DETECTED
GC Probe RNA: NOT DETECTED

## 2016-11-24 LAB — WET PREP BY MOLECULAR PROBE
CANDIDA SPECIES: NOT DETECTED
Gardnerella vaginalis: DETECTED — AB
TRICHOMONAS VAG: NOT DETECTED

## 2016-11-24 LAB — HEPATITIS C ANTIBODY: HCV Ab: NEGATIVE

## 2017-04-21 ENCOUNTER — Ambulatory Visit (INDEPENDENT_AMBULATORY_CARE_PROVIDER_SITE_OTHER): Payer: BLUE CROSS/BLUE SHIELD | Admitting: Certified Nurse Midwife

## 2017-04-21 ENCOUNTER — Encounter: Payer: Self-pay | Admitting: Certified Nurse Midwife

## 2017-04-21 VITALS — BP 122/78 | HR 70 | Resp 16 | Ht 65.5 in | Wt 353.0 lb

## 2017-04-21 DIAGNOSIS — Z01419 Encounter for gynecological examination (general) (routine) without abnormal findings: Secondary | ICD-10-CM

## 2017-04-21 DIAGNOSIS — Z113 Encounter for screening for infections with a predominantly sexual mode of transmission: Secondary | ICD-10-CM | POA: Diagnosis not present

## 2017-04-21 NOTE — Progress Notes (Signed)
  38 yrs Divorced Hispanic female [phone] here for STD testing.  Last sexual activity:  2 weeks ago.  No LMP recorded. Patient is not currently having periods (Reason: IUD).                                                                                             Pt denies abnormal vaginal bleeding, discharge, dysuria, increased urinary frequency or urgency.  Contraception Mirena IUD. Partner in [place] and patient has not be unfaithful with fling. Patient will be traveling there to visit and desires STD screening to make sure no concerns. No other health concerns.  ROS: pertinent to exam  Exam: General appearance: alert, cooperative, appears stated age and no distress GI: soft,non tender Pelvic: External genital: normal female, no lesions or exudate  Inguinal Lymph nodes:non tender,no enlargement Vagina: normal appearance with scant discharge Cervix: No CMT, normal appearance, IUD string noted in cervical os Uterus: normal, non tender, no massess Adnexa:normal, no masses or tenderness   Rectal area: no lesions                 Assessment: Normal pelvic exam STD exposure, desirous of testing History of reactive RPR, previous treatement  Plan:  GC/Chl obtained HIV/RPR/Hep B testing done  Hep C testing obtained D/W pt HSV testing and we have decided together not to pursue this. Discussed importance of consistent condom use for protection. Questions addressed.

## 2017-04-22 LAB — VAGINITIS/VAGINOSIS, DNA PROBE
Candida Species: NEGATIVE
GARDNERELLA VAGINALIS: POSITIVE — AB
Trichomonas vaginosis: NEGATIVE

## 2017-04-22 LAB — GC/CHLAMYDIA PROBE AMP
CHLAMYDIA, DNA PROBE: NEGATIVE
NEISSERIA GONORRHOEAE BY PCR: NEGATIVE

## 2017-04-24 LAB — HEP, RPR, HIV PANEL
HEP B S AG: NEGATIVE
HIV Screen 4th Generation wRfx: NONREACTIVE
RPR: REACTIVE — AB

## 2017-04-24 LAB — RPR, QUANT. (REFLEX): Rapid Plasma Reagin, Quant: 1:1 {titer} — ABNORMAL HIGH

## 2017-04-24 LAB — HEPATITIS C ANTIBODY: Hep C Virus Ab: <0.1 s/co ratio (ref 0.0–0.9)

## 2017-04-26 ENCOUNTER — Other Ambulatory Visit: Payer: Self-pay

## 2017-04-26 MED ORDER — METRONIDAZOLE 500 MG PO TABS: 500.0000 mg | ORAL_TABLET | Freq: Two times a day (BID) | ORAL | 0 refills | Status: DC

## 2017-04-27 ENCOUNTER — Telehealth: Payer: Self-pay | Admitting: *Deleted

## 2017-04-27 NOTE — Telephone Encounter (Signed)
Denyse Amass from [facility] Department in regards to 04/21/17 reactive RPR, initially asked if confirmatory test had been added?   Reviewed lab results dated 04/21/17, he advised titer 1:1, has not increased, confirmed no further testing needed.   Routing to provider for final review. Patient is agreeable to disposition. Will close encounter.

## 2017-05-11 ENCOUNTER — Encounter: Payer: Self-pay | Admitting: Certified Nurse Midwife

## 2017-05-11 ENCOUNTER — Telehealth: Payer: Self-pay | Admitting: Certified Nurse Midwife

## 2017-05-11 ENCOUNTER — Ambulatory Visit (INDEPENDENT_AMBULATORY_CARE_PROVIDER_SITE_OTHER): Payer: BLUE CROSS/BLUE SHIELD | Admitting: Certified Nurse Midwife

## 2017-05-11 VITALS — BP 110/70 | HR 70 | Resp 16 | Ht 65.5 in | Wt 353.0 lb

## 2017-05-11 DIAGNOSIS — N898 Other specified noninflammatory disorders of vagina: Secondary | ICD-10-CM

## 2017-05-11 DIAGNOSIS — Z113 Encounter for screening for infections with a predominantly sexual mode of transmission: Secondary | ICD-10-CM

## 2017-05-11 NOTE — Telephone Encounter (Signed)
Patient says she is having a very bad herpes outbreak.

## 2017-05-11 NOTE — Progress Notes (Signed)
36 y.o. Divorced Hispanic female [phone] here with complaint of vaginal symptoms of itching,  and increase discharge. Describes discharge as green thick and odor..Onset of symptoms 3 days ago.Used left over Metrogel with some relief. Continues with itching.  Denies new personal products. New partner, unprotected sexual activity. Desires vaginal screening only. Has  STD concerns. Urinary symptoms none . Contraception is Mirena IUD.  ROS Pertinent to HPI  O:Healthy female WDWN Affect: normal, orientation x 3  Exam:Skin: warm and dry Abdomen:soft non tender  Inguinal Lymph nodes: no enlargement or tenderness Pelvic exam: External genital: normal female, no lesions BUS: negative Vagina: thick white odorous discharge noted.  Affirm taken Cervix: normal, non tender, no CMT, IUD string noted in cervix Uterus: normal, non tender Adnexa:normal, non tender, no masses or fullness noted   A:Normal pelvic exam R/O vaginal infection STD screening vaginal only   P:Discussed findings of vaginal discharge and concerns with continued exposure with different partners and STD risk. Patient aware, but can't make the partner use condom. Discussed proactive and carrying with her or refusing to have sexual activity. Discussed being responsible for self and her daughter.  Discussed  Baking soda sitz bath for comfort and odor control, until lab results in. Lab: Affirm, Gc/Chlamlydia   Rv prn

## 2017-05-11 NOTE — Telephone Encounter (Signed)
Spoke with patient. Reports swollen vagina, itching, burning, warmth, green vaginal d/c with clumps, no odor. "Spotting on labia". Sharp pelvic pain on Sunday.   Unprotected intercourse on 9/13 with a previous partner who she has not seen in awhile, symptoms started 9/16.   Could not sleep last night d/t symptoms, used metrogel from previous prescription and "it calmed down".   Recommended OV for further evaluation. Patient scheduled for today at 4pm with Diamond Pace, CNM. Patient verbalizes understanding and is agreeable.  Routing to provider for final review. Patient is agreeable to disposition. Will close encounter.

## 2017-05-12 ENCOUNTER — Other Ambulatory Visit: Payer: Self-pay

## 2017-05-12 LAB — VAGINITIS/VAGINOSIS, DNA PROBE
CANDIDA SPECIES: POSITIVE — AB
Gardnerella vaginalis: POSITIVE — AB
TRICHOMONAS VAG: NEGATIVE

## 2017-05-12 LAB — GC/CHLAMYDIA PROBE AMP
Chlamydia trachomatis, NAA: NEGATIVE
Neisseria gonorrhoeae by PCR: NEGATIVE

## 2017-05-12 MED ORDER — METRONIDAZOLE 500 MG PO TABS: 500.0000 mg | ORAL_TABLET | Freq: Two times a day (BID) | ORAL | 0 refills | Status: DC

## 2017-05-12 MED ORDER — TERCONAZOLE 0.4 % VA CREA: TOPICAL_CREAM | VAGINAL | 0 refills | Status: DC

## 2017-08-22 ENCOUNTER — Telehealth: Payer: Self-pay | Admitting: Certified Nurse Midwife

## 2017-08-22 ENCOUNTER — Other Ambulatory Visit: Payer: Self-pay

## 2017-08-22 ENCOUNTER — Ambulatory Visit: Payer: BLUE CROSS/BLUE SHIELD | Admitting: Certified Nurse Midwife

## 2017-08-22 ENCOUNTER — Encounter: Payer: Self-pay | Admitting: Certified Nurse Midwife

## 2017-08-22 VITALS — BP 124/90 | HR 68 | Resp 16 | Ht 65.5 in | Wt 365.0 lb

## 2017-08-22 DIAGNOSIS — Z113 Encounter for screening for infections with a predominantly sexual mode of transmission: Secondary | ICD-10-CM | POA: Diagnosis not present

## 2017-08-22 NOTE — Telephone Encounter (Signed)
Spoke with patient. Patient had intercourse on 08/19/2017 and 08/21/2017. Partner advised her he previously had Chlamydia, but was treated. Patient has concerns about if he was actually treated and her risk for exposure. Recommended patient be seen in office to discuss and for testing. Patient is agreeable. Appointment scheduled for 08/22/2017 at 10:0 am with Leota Sauerseborah Leonard CNM.  Routing to provider for final review. Patient agreeable to disposition. Will close encounter.

## 2017-08-22 NOTE — Progress Notes (Signed)
[age] y.o. Divorced  female [phone] here due to sexual activity with new partner x 2, with last occurrence this am. Partner admitted to having Chlamydia and was hesitant when asked about treatment, but did say he was treated.. Patient concerned about STD exposure. Patient ex-boyfriend is coming back to the [place] She is unsure if she will be seeing him again. She "does like to have sex with him" and he is good financial support. Denies vaginal discharge or odor. Contraception IUD. No other health issues.   ROS Pertinent to above.  O:Healthy female WDWN Affect: normal, orientation x 3  Exam: Skin: warm and dry Abdomen: soft, non tender,  Inguinal Lymph nodes: no enlargement or tenderness Pelvic exam: External genital: normal female BUS: negative Vagina: scant  discharge noted. ,Wet prep taken, Affirm taken Cervix: normal, non tender, no CMT, IUD string noted in cervix Uterus: normal, non tender Adnexa:normal, non tender, no masses or fullness noted  A:Normal pelvic exam STD screening vaginal only Partner change who was treated for Chlamydia ? Year ago. Contraception IUD   P:Discussed findings of normal pelvic exam. Discussed STD prevention strongly with condom use and limited partners.  Discussed risks with IUD with PID if contracts STD. Questions addressed. Patient was on phone throughout her exam and conversation. Questions addressed after phone conversation ended. Lab: Affirm, GC/Chlamydia  Rv prn

## 2017-08-22 NOTE — Telephone Encounter (Signed)
Patient has a question regarding std testing.

## 2017-08-23 LAB — VAGINITIS/VAGINOSIS, DNA PROBE
CANDIDA SPECIES: NEGATIVE
Gardnerella vaginalis: NEGATIVE
Trichomonas vaginosis: NEGATIVE

## 2017-08-24 LAB — CHLAMYDIA/GC NAA, CONFIRMATION
CHLAMYDIA TRACHOMATIS, NAA: NEGATIVE
NEISSERIA GONORRHOEAE, NAA: NEGATIVE

## 2017-10-20 DIAGNOSIS — J392 Other diseases of pharynx: Secondary | ICD-10-CM | POA: Insufficient documentation

## 2017-10-20 DIAGNOSIS — Q385 Congenital malformations of palate, not elsewhere classified: Secondary | ICD-10-CM | POA: Insufficient documentation

## 2017-11-24 ENCOUNTER — Ambulatory Visit: Payer: BLUE CROSS/BLUE SHIELD | Admitting: Certified Nurse Midwife

## 2017-11-24 ENCOUNTER — Other Ambulatory Visit: Payer: Self-pay

## 2017-11-24 ENCOUNTER — Encounter: Payer: Self-pay | Admitting: Certified Nurse Midwife

## 2017-11-24 VITALS — BP 130/78 | HR 68 | Resp 16 | Ht 65.25 in | Wt 352.0 lb

## 2017-11-24 DIAGNOSIS — Z01419 Encounter for gynecological examination (general) (routine) without abnormal findings: Secondary | ICD-10-CM

## 2017-11-24 DIAGNOSIS — Z113 Encounter for screening for infections with a predominantly sexual mode of transmission: Secondary | ICD-10-CM | POA: Diagnosis not present

## 2017-11-24 NOTE — Patient Instructions (Signed)

## 2017-11-24 NOTE — Progress Notes (Signed)
37 y.o. [id number] Divorced  Hispanic Fe here for annual exam. Periods scant to regular with Mirena IUD.Working on weight loss and down 13 pounds. Also exercising and start back to gym. Partner change,desires STD screening. Not consistent condom use. Sees Endocrine for hypertension,diabetes, cholesterol management and labs. "really working to balance medications". Diet is better. Sees MD every 3 months for follow up.  Still smoking, but has reduced. Saw ENT for throat issues. Has loose stools at times with Metformin use, but stable. No health issues today  No LMP recorded. (Menstrual status: IUD).          Sexually active: Yes.    The current method of family planning is IUD.    Exercising: No.  exercise Smoker:  yes  Health Maintenance: Pap:  04-22-16 neg HPV HR neg History of Abnormal Pap: yes MMG:  none Self Breast exams: no Colonoscopy:  none BMD:   none TDaP:  2015 Shingles: no Pneumonia: 2015 Hep C and HIV: both neg 2018 Labs: yes   reports that she has been smoking cigarettes.  She has never used smokeless tobacco. She reports that she drinks alcohol. She reports that she does not use drugs.  Past Medical History:  Diagnosis Date  . Abnormal Pap smear 2005   ASCUS +HPV  . Anxiety   . Anxiety and depression   . Depression   . Diabetes mellitus without complication (HCC)   . Genital warts   . Hypertension   . Syphilis in female     Past Surgical History:  Procedure Laterality Date  . CHOLECYSTECTOMY  6/10   with liver infection, also occluded bile duct    Current Outpatient Medications  Medication Sig Dispense Refill  . ACCU-CHEK FASTCLIX LANCETS MISC 1 Units by Percutaneous route 4 (four) times daily. 100 each 12  . atorvastatin (LIPITOR) 10 MG tablet Take 10 mg by mouth daily.    . Dapagliflozin-Metformin HCl ER 11-998 MG TB24 Take by mouth.    . Dulaglutide 1.5 MG/0.5ML SOPN (Trulicity) Inject 1.5 mg weekly for diabetes    . glucose blood (BAYER CONTOUR TEST) test  strip Check blood sugar level 4 times daily AM and 2 hours PP 100 each 12  . Insulin Degludec (TRESIBA FLEXTOUCH [place]) Inject into the skin. 12 units    . levonorgestrel (MIRENA) 20 MCG/24HR IUD by Intrauterine route.    Marland Kitchen lisinopril-hydrochlorothiazide (PRINZIDE,ZESTORETIC) 10-12.5 MG per tablet Take 1 tablet by mouth daily.     . pantoprazole (PROTONIX) 40 MG tablet Take 40 mg by mouth daily.     . pioglitazone (ACTOS) 30 MG tablet Take 1 tablet once a day for diabetes    . sertraline (ZOLOFT) 100 MG tablet Take 150 mg by mouth at bedtime.      No current facility-administered medications for this visit.     Family History  Problem Relation Age of Onset  . Cancer Father        lung  . Hypertension Father   . Stroke Father   . Heart disease Father   . Hypertension Mother   . Diabetes Paternal Grandmother   . Cancer Paternal Grandfather        prostate    ROS:  Pertinent items are noted in HPI.  Otherwise, a comprehensive ROS was negative.  Exam:   BP 130/78   Pulse 68   Resp 16   Ht 5' 5.25" (1.657 m)   Wt (!) 352 lb (159.7 kg)   BMI 58.13 kg/m  Height:  5' 5.25" (165.7 cm) Ht Readings from Last 3 Encounters:  11/24/17 5' 5.25" (1.657 m)  08/22/17 5' 5.5" (1.664 m)  05/11/17 5' 5.5" (1.664 m)    General appearance: alert, cooperative and appears stated age Head: Normocephalic, without obvious abnormality, atraumatic Neck: no adenopathy, supple, symmetrical, trachea midline and thyroid normal to inspection and palpation Lungs: clear to auscultation bilaterally Breasts: normal appearance, no masses or tenderness, No nipple retraction or dimpling, No nipple discharge or bleeding, No axillary or supraclavicular adenopathy Heart: regular rate and rhythm Abdomen: soft, non-tender; no masses,  no organomegaly Extremities: extremities normal, atraumatic, no cyanosis or edema Skin: Skin color, texture, turgor normal. No rashes or lesions Lymph nodes: Cervical, supraclavicular,  and axillary nodes normal. No abnormal inguinal nodes palpated Neurologic: Grossly normal   Pelvic: External genitalia:  no lesions, normal female              Urethra:  normal appearing urethra with no masses, tenderness or lesions              Bartholin's and Skene's: normal                 Vagina: normal appearing vagina with normal color and discharge, no lesions              Cervix: multiparous appearance, no cervical motion tenderness, no lesions and IUD string noted in cervix              Pap taken: No. Bimanual Exam:  Uterus:  normal size, contour, position, consistency, mobility, non-tender and limited by body habitus              Adnexa: normal adnexa, no mass, fullness, tenderness and no large masses felt, limited by body habitus               Rectovaginal: Confirms               Anus:  normal sphincter tone, no lesions  Chaperone present: yes  A:  Well Woman with normal exam  Contraception Mirena IUD  Hypertension/diabetes, GERD, Anxiety management with MD  Morbid obesity on weight loss  STD screening desired  P:   Reviewed health and wellness pertinent to exam  Warning signs with IUD reviewed  Continue follow up with MD regarding health issues  Continue on weight loss journey and exercise  Labs: STD panel, Hep C, GC/cChlamydia, Affirm  Pap smear: no   counseled on breast self exam, STD prevention, HIV risk factors and prevention, feminine hygiene, adequate intake of calcium and vitamin D, diet and exercise  return annually or prn  An After Visit Summary was printed and given to the patient.

## 2017-11-25 LAB — HEP, RPR, HIV PANEL
HEP B S AG: NEGATIVE
HIV Screen 4th Generation wRfx: NONREACTIVE
RPR Ser Ql: NONREACTIVE

## 2017-11-25 LAB — GC/CHLAMYDIA PROBE AMP
CHLAMYDIA, DNA PROBE: NEGATIVE
NEISSERIA GONORRHOEAE BY PCR: NEGATIVE

## 2017-11-25 LAB — VAGINITIS/VAGINOSIS, DNA PROBE
CANDIDA SPECIES: POSITIVE — AB
Gardnerella vaginalis: NEGATIVE
Trichomonas vaginosis: NEGATIVE

## 2017-11-25 LAB — HEPATITIS C ANTIBODY: Hep C Virus Ab: <0.1 s/co ratio (ref 0.0–0.9)

## 2018-03-16 ENCOUNTER — Encounter: Payer: Self-pay | Admitting: Certified Nurse Midwife

## 2018-03-16 ENCOUNTER — Ambulatory Visit: Payer: BLUE CROSS/BLUE SHIELD | Admitting: Certified Nurse Midwife

## 2018-03-16 ENCOUNTER — Other Ambulatory Visit: Payer: Self-pay

## 2018-03-16 VITALS — BP 114/70 | HR 68 | Resp 16 | Ht 65.25 in | Wt 352.0 lb

## 2018-03-16 DIAGNOSIS — N898 Other specified noninflammatory disorders of vagina: Secondary | ICD-10-CM

## 2018-03-16 DIAGNOSIS — Z113 Encounter for screening for infections with a predominantly sexual mode of transmission: Secondary | ICD-10-CM | POA: Diagnosis not present

## 2018-03-16 NOTE — Patient Instructions (Signed)
Sexually Transmitted Disease  A sexually transmitted disease (STD) is a disease or infection that may be passed (transmitted) from person to person, usually during sexual activity. This may happen by way of saliva, semen, blood, vaginal mucus, or urine. Common STDs include:   Gonorrhea.   Chlamydia.   Syphilis.   HIV and AIDS.   Genital herpes.   Hepatitis B and C.   Trichomonas.   Human papillomavirus (HPV).   Pubic lice.   Scabies.   Mites.   Bacterial vaginosis.    What are the causes?  An STD may be caused by bacteria, a virus, or parasites. STDs are often transmitted during sexual activity if one person is infected. However, they may also be transmitted through nonsexual means. STDs may be transmitted after:   Sexual intercourse with an infected person.   Sharing sex toys with an infected person.   Sharing needles with an infected person or using unclean piercing or tattoo needles.   Having intimate contact with the genitals, mouth, or rectal areas of an infected person.   Exposure to infected fluids during birth.    What are the signs or symptoms?  Different STDs have different symptoms. Some people may not have any symptoms. If symptoms are present, they may include:   Painful or bloody urination.   Pain in the pelvis, abdomen, vagina, anus, throat, or eyes.   A skin rash, itching, or irritation.   Growths, ulcerations, blisters, or sores in the genital and anal areas.   Abnormal vaginal discharge with or without bad odor.   Penile discharge in men.   Fever.   Pain or bleeding during sexual intercourse.   Swollen glands in the groin area.   Yellow skin and eyes (jaundice). This is seen with hepatitis.   Swollen testicles.   Infertility.   Sores and blisters in the mouth.    How is this diagnosed?  To make a diagnosis, your health care provider may:   Take a medical history.   Perform a physical exam.   Take a sample of any discharge to examine.   Swab the throat, cervix,  opening to the penis, rectum, or vagina for testing.   Test a sample of your first morning urine.   Perform blood tests.   Perform a Pap test, if this applies.   Perform a colposcopy.   Perform a laparoscopy.    How is this treated?  Treatment depends on the STD. Some STDs may be treated but not cured.   Chlamydia, gonorrhea, trichomonas, and syphilis can be cured with antibiotic medicine.   Genital herpes, hepatitis, and HIV can be treated, but not cured, with prescribed medicines. The medicines lessen symptoms.   Genital warts from HPV can be treated with medicine or by freezing, burning (electrocautery), or surgery. Warts may come back.   HPV cannot be cured with medicine or surgery. However, abnormal areas may be removed from the cervix, vagina, or vulva.   If your diagnosis is confirmed, your recent sexual partners need treatment. This is true even if they are symptom-free or have a negative culture or evaluation. They should not have sex until their health care providers say it is okay.   Your health care provider may test you for infection again 3 months after treatment.    How is this prevented?  Take these steps to reduce your risk of getting an STD:   Use latex condoms, dental dams, and water-soluble lubricants during sexual activity. Do not use   petroleum jelly or oils.   Avoid having multiple sex partners.   Do not have sex with someone who has other sex partners.   Do not have sex with anyone you do not know or who is at high risk for an STD.   Avoid risky sex practices that can break your skin.   Do not have sex if you have open sores on your mouth or skin.   Avoid drinking too much alcohol or taking illegal drugs. Alcohol and drugs can affect your judgment and put you in a vulnerable position.   Avoid engaging in oral and anal sex acts.   Get vaccinated for HPV and hepatitis. If you have not received these vaccines in the past, talk to your health care provider about whether one or  both might be right for you.   If you are at risk of being infected with HIV, it is recommended that you take a prescription medicine daily to prevent HIV infection. This is called pre-exposure prophylaxis (PrEP). You are considered at risk if:  ? You are a man who has sex with other men (MSM).  ? You are a heterosexual man or woman and are sexually active with more than one partner.  ? You take drugs by injection.  ? You are sexually active with a partner who has HIV.   Talk with your health care provider about whether you are at high risk of being infected with HIV. If you choose to begin PrEP, you should first be tested for HIV. You should then be tested every 3 months for as long as you are taking PrEP.    Contact a health care provider if:   See your health care provider.   Tell your sexual partner(s). They should be tested and treated for any STDs.   Do not have sex until your health care provider says it is okay.  Get help right away if:  Contact your health care provider right away if:   You have severe abdominal pain.   You are a man and notice swelling or pain in your testicles.   You are a woman and notice swelling or pain in your vagina.    This information is not intended to replace advice given to you by your health care provider. Make sure you discuss any questions you have with your health care provider.  Document Released: 10/01/2002 Document Revised: 01/29/2016 Document Reviewed: 01/29/2013  Elsevier Interactive Patient Education  2018 Elsevier Inc[employer]

## 2018-03-16 NOTE — Progress Notes (Signed)
37 y.o. Divorced Hispanic female [phone] here with complaint of vaginal symptoms of itching, burning at times and noted some bumps for the past month.  Seeing last of period,and some musty odor. . Onset of symptoms 7 days ago. Denies new personal products .  New partner(bisexual), for about one month but request STD ? STD concerns. Urinary symptoms none . Contraception is Mirena IUD.  Review of Systems  Constitutional: Negative.   HENT: Negative.   Eyes: Negative.   Respiratory: Negative.   Cardiovascular: Negative.   Gastrointestinal: Negative.   Genitourinary:       Abnormal discharge  Musculoskeletal: Negative.   Skin: Negative.   Neurological: Negative.   Endo/Heme/Allergies: Negative.   Psychiatric/Behavioral: Negative.     O:Healthy female WDWN Affect: normal, orientation x 3  Exam:Skin: warm and dry Abdomen:soft, non tender, no masses  inguinal Lymph nodes: no enlargement or tenderness Pelvic exam: External genital: normal female,  No lesions or redness or scaling, small sebaceous cyst noted on perineal body, mobile, not red or tender BUS: negative Vagina: scant white non odorous discharge noted. Affirm taken Cervix: normal, non tender, no CMT, IUD string noted in cervix Uterus: normal, non tender Adnexa:normal, non tender, no masses or fullness noted  A:Normal pelvic exam Contraception Mirena IUD STD screening vaginal only, History of Syphillis treated Sebaceous cyst noted on perineal body to right, not inflamed   P:Discussed findings of normal pelvic exam and etiology. Discussed Aveeno or baking soda sitz bath for comfort if itching present.. Avoid moist clothes or pads for extended period of time.  Stressed importance of monogamous relationship and infrequent partner change, which reduces her risk of contracting another STD. Patient aware of risk. Does not use condoms. Declines serology testing due to short relationship at this time. Will come back in 3 months for  screening again. Labs: Affirm, Gc/Chlamydia Future labs: STD panel, Hep C  Rv prn

## 2018-03-17 LAB — VAGINITIS/VAGINOSIS, DNA PROBE
Candida Species: NEGATIVE
Gardnerella vaginalis: NEGATIVE
Trichomonas vaginosis: NEGATIVE

## 2018-03-21 LAB — GC/CHLAMYDIA PROBE AMP
CHLAMYDIA, DNA PROBE: NEGATIVE
Neisseria gonorrhoeae by PCR: NEGATIVE

## 2018-03-21 NOTE — Addendum Note (Signed)
Addended by: Verner CholLEONARD, Bettylou Frew S on: 03/21/2018 07:32 AM   Modules accepted: Orders

## 2018-06-15 ENCOUNTER — Other Ambulatory Visit (INDEPENDENT_AMBULATORY_CARE_PROVIDER_SITE_OTHER): Payer: BLUE CROSS/BLUE SHIELD

## 2018-06-15 DIAGNOSIS — Z113 Encounter for screening for infections with a predominantly sexual mode of transmission: Secondary | ICD-10-CM

## 2018-06-16 LAB — HEP, RPR, HIV PANEL
HIV SCREEN 4TH GENERATION: NONREACTIVE
Hepatitis B Surface Ag: NEGATIVE
RPR: NONREACTIVE

## 2018-06-16 LAB — HEPATITIS C ANTIBODY

## 2018-11-06 ENCOUNTER — Encounter: Payer: Self-pay | Admitting: Obstetrics and Gynecology

## 2018-11-06 ENCOUNTER — Telehealth: Payer: Self-pay | Admitting: Certified Nurse Midwife

## 2018-11-06 ENCOUNTER — Other Ambulatory Visit: Payer: Self-pay

## 2018-11-06 ENCOUNTER — Ambulatory Visit: Payer: BLUE CROSS/BLUE SHIELD | Admitting: Obstetrics and Gynecology

## 2018-11-06 VITALS — BP 118/84 | HR 68 | Temp 98.7°F | Wt 337.0 lb

## 2018-11-06 DIAGNOSIS — Z30431 Encounter for routine checking of intrauterine contraceptive device: Secondary | ICD-10-CM

## 2018-11-06 DIAGNOSIS — N760 Acute vaginitis: Secondary | ICD-10-CM

## 2018-11-06 DIAGNOSIS — N898 Other specified noninflammatory disorders of vagina: Secondary | ICD-10-CM | POA: Diagnosis not present

## 2018-11-06 DIAGNOSIS — N926 Irregular menstruation, unspecified: Secondary | ICD-10-CM

## 2018-11-06 DIAGNOSIS — B9689 Other specified bacterial agents as the cause of diseases classified elsewhere: Secondary | ICD-10-CM | POA: Diagnosis not present

## 2018-11-06 DIAGNOSIS — R102 Pelvic and perineal pain: Secondary | ICD-10-CM

## 2018-11-06 DIAGNOSIS — R109 Unspecified abdominal pain: Secondary | ICD-10-CM | POA: Diagnosis not present

## 2018-11-06 DIAGNOSIS — N644 Mastodynia: Secondary | ICD-10-CM

## 2018-11-06 DIAGNOSIS — Z113 Encounter for screening for infections with a predominantly sexual mode of transmission: Secondary | ICD-10-CM

## 2018-11-06 LAB — POCT URINALYSIS DIPSTICK
Bilirubin, UA: NEGATIVE
Blood, UA: NEGATIVE
Glucose, UA: POSITIVE — AB
Ketones, UA: NEGATIVE
Leukocytes, UA: NEGATIVE
Nitrite, UA: NEGATIVE
Protein, UA: NEGATIVE
Urobilinogen, UA: NEGATIVE E.U./dL — AB
pH, UA: 5 (ref 5.0–8.0)

## 2018-11-06 LAB — POCT URINE PREGNANCY: Preg Test, Ur: NEGATIVE

## 2018-11-06 MED ORDER — METRONIDAZOLE 500 MG PO TABS: 500.0000 mg | ORAL_TABLET | Freq: Two times a day (BID) | ORAL | 0 refills | Status: DC

## 2018-11-06 NOTE — Telephone Encounter (Signed)
Spoke with patient. Patient reports sore nipples for 2 weeks, intermittent pelvic cramping 4/10, intermittent sharp pain from pelvic radiating into her lower back, nausea, and clear vaginal discharge. Reports today she has been intermittently light headed and dizzy to where she needs to sit down. Denies fever, chills, vomiting, or vaginal bleeding. Patient has a Mirena IUD. States she has cycles monthly with her IUD that last 3-6 days. LMP was 09/07/2018. No cycle in March. Has not taken UPT. States there is a possible risk for STD exposure. Reviewed with Dr.Jertson who wants patient to take UPT and return call with results. If positive will need to be seen in the ER for evaluation. If negative needs to be seen in the office with Dr.Jertson today. Patient will go get UPT now and return call.

## 2018-11-06 NOTE — Patient Instructions (Addendum)
To try and decrease your breast pain, you should have a well fitting supportive bra, cut back on caffeine, and use ice or heat as needed. Some women find relief with the supplement evening primrose oil.   Bacterial Vaginosis  Bacterial vaginosis is a vaginal infection that occurs when the normal balance of bacteria in the vagina is disrupted. It results from an overgrowth of certain bacteria. This is the most common vaginal infection among women ages 715-44. Because bacterial vaginosis increases your risk for STIs (sexually transmitted infections), getting treated can help reduce your risk for chlamydia, gonorrhea, herpes, and HIV (human immunodeficiency virus). Treatment is also important for preventing complications in pregnant women, because this condition can cause an early (premature) delivery. What are the causes? This condition is caused by an increase in harmful bacteria that are normally present in small amounts in the vagina. However, the reason that the condition develops is not fully understood. What increases the risk? The following factors may make you more likely to develop this condition:  Having a new sexual partner or multiple sexual partners.  Having unprotected sex.  Douching.  Having an intrauterine device (IUD).  Smoking.  Drug and alcohol abuse.  Taking certain antibiotic medicines.  Being pregnant. You cannot get bacterial vaginosis from toilet seats, bedding, swimming pools, or contact with objects around you. What are the signs or symptoms? Symptoms of this condition include:  Grey or white vaginal discharge. The discharge can also be watery or foamy.  A fish-like odor with discharge, especially after sexual intercourse or during menstruation.  Itching in and around the vagina.  Burning or pain with urination. Some women with bacterial vaginosis have no signs or symptoms. How is this diagnosed? This condition is diagnosed based on:  Your medical  history.  A physical exam of the vagina.  Testing a sample of vaginal fluid under a microscope to look for a large amount of bad bacteria or abnormal cells. Your health care provider may use a cotton swab or a small wooden spatula to collect the sample. How is this treated? This condition is treated with antibiotics. These may be given as a pill, a vaginal cream, or a medicine that is put into the vagina (suppository). If the condition comes back after treatment, a second round of antibiotics may be needed. Follow these instructions at home: Medicines  Take over-the-counter and prescription medicines only as told by your health care provider.  Take or use your antibiotic as told by your health care provider. Do not stop taking or using the antibiotic even if you start to feel better. General instructions  If you have a female sexual partner, tell her that you have a vaginal infection. She should see her health care provider and be treated if she has symptoms. If you have a female sexual partner, he does not need treatment.  During treatment: ? Avoid sexual activity until you finish treatment. ? Do not douche. ? Avoid alcohol as directed by your health care provider. ? Avoid breastfeeding as directed by your health care provider.  Drink enough water and fluids to keep your urine clear or pale yellow.  Keep the area around your vagina and rectum clean. ? Wash the area daily with warm water. ? Wipe yourself from front to back after using the toilet.  Keep all follow-up visits as told by your health care provider. This is important. How is this prevented?  Do not douche.  Wash the outside of your vagina with warm  water only.  Use protection when having sex. This includes latex condoms and dental dams.  Limit how many sexual partners you have. To help prevent bacterial vaginosis, it is best to have sex with just one partner (monogamous).  Make sure you and your sexual partner are  tested for STIs.  Wear cotton or cotton-lined underwear.  Avoid wearing tight pants and pantyhose, especially during summer.  Limit the amount of alcohol that you drink.  Do not use any products that contain nicotine or tobacco, such as cigarettes and e-cigarettes. If you need help quitting, ask your health care provider.  Do not use illegal drugs. Where to find more information  Centers for Disease Control and Prevention: SolutionApps.co.za  American Sexual Health Association (ASHA): www.ashastd.org  U.[place] Department of Health and Health and safety inspector, Office on Women's Health: ConventionalMedicines.si or http://www.anderson-williamson.info/ Contact a health care provider if:  Your symptoms do not improve, even after treatment.  You have more discharge or pain when urinating.  You have a fever.  You have pain in your abdomen.  You have pain during sex.  You have vaginal bleeding between periods. Summary  Bacterial vaginosis is a vaginal infection that occurs when the normal balance of bacteria in the vagina is disrupted.  Because bacterial vaginosis increases your risk for STIs (sexually transmitted infections), getting treated can help reduce your risk for chlamydia, gonorrhea, herpes, and HIV (human immunodeficiency virus). Treatment is also important for preventing complications in pregnant women, because the condition can cause an early (premature) delivery.  This condition is treated with antibiotic medicines. These may be given as a pill, a vaginal cream, or a medicine that is put into the vagina (suppository). This information is not intended to replace advice given to you by your health care provider. Make sure you discuss any questions you have with your health care provider. Document Released: 07/11/2005 Document Revised: 11/14/2016 Document Reviewed: 03/26/2016 Elsevier Interactive Patient Education  2019 [employer].

## 2018-11-06 NOTE — Telephone Encounter (Signed)
Patient calling as follow up to [employer]. She is having cramping, dizziness, clear discharge, nausea. Started feeling really bad since sending mychart message.

## 2018-11-06 NOTE — Telephone Encounter (Signed)
Spoke with patient who took 2 UPT that were both negative. Appointment scheduled for today at 4:30 pm with Dr.Jertson. Patient is agreeable to date and time. Encounter closed.

## 2018-11-06 NOTE — Telephone Encounter (Signed)
Message   Good morning [name],  I have my annual on 5/8 and I was wondering if it could be sooner? If so will my insurance cover it?  I have not being feeling well (nipples sensitive/tender, tightening of stomach, pelvic cramping, R lower back above booty -steady pain, headache, light headed, sometimes dizzy and nauseous). Not sure if I have an STD or these are pregnancy symptoms? Not sure if I can get checked sooner or need to wait?     Please advise,  Thank you very much,  [name]

## 2018-11-06 NOTE — Progress Notes (Signed)
GYNECOLOGY  VISIT   HPI: 38 y.o.   Divorced White or Caucasian Hispanic or Latino  female   [phone] with Patient's last menstrual period was 09/06/2018 (exact date).   here for pelvic pain with iud, nipple sensitivity, vaginal discharge.  For the last 2 weeks her nipples have been very sensitive, fell "bruised". She has headaches for the last 2 weeks (tension, under stress).  She c/o a 2 week h/o intermittent pelvic cramping, up to a 3-4/10 in severity, occasionally sharp. The pain can last a couple of minutes. Last had the pain last night.   No fever, normal BM, normal bladder function.  She c/o clear vaginal d/c for ~the last week. No itching, burning or irritation. Slight vaginal odor. She has irregular cycles, ranges from 1-3 months. The last cycle was in 2/20. Typically bleeds x 3-6 days, light. Has bad cramps and bloating with her cycle. Cramps are tolerable.  Sexually active, she has a partner and had a one night stand in January. Didn't use condoms.  Patient had a negative UPT today, requests blood work (states she has had false negative UPT's in the past)  GYNECOLOGIC HISTORY: Patient's last menstrual period was 09/06/2018 (exact date). Contraception:iud Menopausal hormone therapy: none upt today-neg poct urine-neg        OB History    Gravida  4   Para  2   Term  2   Preterm      AB  2   Living  2     SAB  1   TAB      Ectopic  1   Multiple      Live Births  2              Patient Active Problem List   Diagnosis Date Noted  . 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 Throat irritation 10/20/2017  . Mixed dyslipidemia 11/01/2016  . Severe obesity (BMI >= 40) (HCC) 11/01/2016  . Smoking 11/01/2016  . Vitamin D deficiency 11/01/2016  . Anxiety state 08/16/2013  . Fetal macrosomia in pregnancy in third trimester, antepartum 07/11/2013  . Chronic hypertension complicating or reason for care during pregnancy 06/24/2013  . Hypersomnia 06/24/2013  . Diabetes mellitus,  antepartum(648.03) 02/05/2013  . Obesity complicating peripregnancy, antepartum 02/05/2013  . Pregnancy 01/29/2013  . Esophageal reflux 01/04/2013  . Hypertension, essential 01/01/2013  . Type 2 diabetes mellitus with hyperglycemia, with long-term current use of insulin (HCC) 01/01/2013  . Pregnancy with history of ectopic pregnancy in first trimester 01/01/2013  . Depressive disorder 10/03/2012  . Venous (peripheral) insufficiency 10/03/2012    Past Medical History:  Diagnosis Date  . Abnormal Pap smear 2005   ASCUS +HPV  . Anxiety   . Anxiety and depression   . Depression   . Diabetes mellitus without complication (HCC)   . Genital warts   . Hypertension   . Syphilis in female     Past Surgical History:  Procedure Laterality Date  . CHOLECYSTECTOMY  6/10   with liver infection, also occluded bile duct    Current Outpatient Medications  Medication Sig Dispense Refill  . ACCU-CHEK FASTCLIX LANCETS MISC 1 Units by Percutaneous route 4 (four) times daily. 100 each 12  . atorvastatin (LIPITOR) 10 MG tablet Take 10 mg by mouth daily.    . Dapagliflozin-metFORMIN HCl ER (XIGDUO XR) 11-998 MG TB24 Take by mouth.    . Dulaglutide 1.5 MG/0.5ML SOPN (Trulicity) Inject 1.5 mg weekly for diabetes    . glucose blood (  BAYER CONTOUR TEST) test strip Check blood sugar level 4 times daily AM and 2 hours PP 100 each 12  . Insulin Degludec (TRESIBA FLEXTOUCH [place]) Inject into the skin. 60 units    . levonorgestrel (MIRENA) 20 MCG/24HR IUD by Intrauterine route.    . pantoprazole (PROTONIX) 40 MG tablet Take 40 mg by mouth daily.     . pioglitazone (ACTOS) 30 MG tablet Take 1 tablet once a day for diabetes    . sertraline (ZOLOFT) 100 MG tablet Take 150 mg by mouth at bedtime.     Marland Kitchen VITAMIN D PO Take 5,000 Int'l Units by mouth.     No current facility-administered medications for this visit.      ALLERGIES: Neosporin [neomycin-polymyxin-gramicidin]  Family History  Problem Relation Age  of Onset  . Cancer Father        lung  . Hypertension Father   . Stroke Father   . Heart disease Father   . Hypertension Mother   . Diabetes Paternal Grandmother   . Cancer Paternal Grandfather        prostate    Social History   Socioeconomic History  . Marital status: Divorced    Spouse name: Not on file  . Number of children: Not on file  . Years of education: Not on file  . Highest education level: Not on file  Occupational History  . Not on file  Social Needs  . Financial resource strain: Not on file  . Food insecurity:    Worry: Not on file    Inability: Not on file  . Transportation needs:    Medical: Not on file    Non-medical: Not on file  Tobacco Use  . Smoking status: Current Every Day Smoker    Types: Cigarettes    Last attempt to quit: 12/02/2012    Years since quitting: 5.9  . Smokeless tobacco: Never Used  . Tobacco comment: less than 1/2 a pack a day  Substance and Sexual Activity  . Alcohol use: Yes    Alcohol/week: 0.0 standard drinks    Comment: occ  . Drug use: No  . Sexual activity: Yes    Partners: Male    Birth control/protection: I.U.D.  Lifestyle  . Physical activity:    Days per week: Not on file    Minutes per session: Not on file  . Stress: Not on file  Relationships  . Social connections:    Talks on phone: Not on file    Gets together: Not on file    Attends religious service: Not on file    Active member of club or organization: Not on file    Attends meetings of clubs or organizations: Not on file    Relationship status: Not on file  . Intimate partner violence:    Fear of current or ex partner: Not on file    Emotionally abused: Not on file    Physically abused: Not on file    Forced sexual activity: Not on file  Other Topics Concern  . Not on file  Social History Narrative  . Not on file    Review of Systems  Constitutional: Negative.   HENT: Negative.   Eyes: Negative.   Respiratory: Negative.    Cardiovascular: Negative.   Gastrointestinal: Negative.   Genitourinary:       Pelvic pain, nipple sensitivity, dizziness, fatigue,vaginal discharge  Musculoskeletal: Negative.   Skin: Negative.   Neurological: Negative.   Endo/Heme/Allergies: Negative.   Psychiatric/Behavioral: Negative.  PHYSICAL EXAMINATION:    BP 118/84   Pulse 68   Temp 98.7 F (37.1 C) (Oral)   Wt (!) 337 lb (152.9 kg)   LMP 09/06/2018 (Exact Date)   BMI 55.65 kg/m     General appearance: alert, cooperative and appears stated age Neck: no adenopathy, supple, symmetrical, trachea midline and thyroid normal to inspection and palpation Breasts: normal appearance, no lumps, bilaterally tender Abdomen: soft, non-tender; non distended, no masses,  no organomegaly  Pelvic: External genitalia:  no lesions              Urethra:  normal appearing urethra with no masses, tenderness or lesions              Bartholins and Skenes: normal                 Vagina: normal appearing vagina with normal color and discharge, no lesions              Cervix: no cervical motion tenderness, no lesions and IUD string 2-3 cm              Bimanual Exam:  Uterus:  normal size, contour, position, consistency, mobility, non-tender              Adnexa: no mass, fullness, tenderness              Rectovaginal: Yes.  .  Confirms.              Anus:  normal sphincter tone, no lesions  Chaperone was present for exam.  Wet prep: ++ clue, no trich, rare wbc KOH: no yeast PH: 5   ASSESSMENT Abdominal pelvic pain Missed menses, likely from the IUD, but patient is worried about pregnancy (even with negative UPT) Vaginal d/c with odor, BV on vaginal slides IUD check Nipple pain Screening std    PLAN CBC with diff STD testing Negative UPT, patient request [facility]    An After Visit Summary was printed and given to the patient.  ~25 minutes face to face time of which over 50% was spent in counseling.

## 2018-11-06 NOTE — Telephone Encounter (Signed)
Addendum:  oh and i forgot to add vaginal discharge (clear and daily)

## 2018-11-07 ENCOUNTER — Other Ambulatory Visit: Payer: Self-pay

## 2018-11-07 LAB — CBC WITH DIFFERENTIAL/PLATELET
Basophils Absolute: 0.1 10*3/uL (ref 0.0–0.2)
Basos: 1 %
EOS (ABSOLUTE): 0.1 10*3/uL (ref 0.0–0.4)
Eos: 1 %
Hematocrit: 45.6 % (ref 34.0–46.6)
Hemoglobin: 14.6 g/dL (ref 11.1–15.9)
Immature Grans (Abs): 0 10*3/uL (ref 0.0–0.1)
Immature Granulocytes: 0 %
Lymphocytes Absolute: 3 10*3/uL (ref 0.7–3.1)
Lymphs: 28 %
MCH: 27.8 pg (ref 26.6–33.0)
MCHC: 32 g/dL (ref 31.5–35.7)
MCV: 87 fL (ref 79–97)
Monocytes Absolute: 0.6 10*3/uL (ref 0.1–0.9)
Monocytes: 6 %
Neutrophils Absolute: 7 10*3/uL (ref 1.4–7.0)
Neutrophils: 64 %
Platelets: 312 10*3/uL (ref 150–450)
RBC: 5.26 x10E6/uL (ref 3.77–5.28)
RDW: 12.2 % (ref 11.7–15.4)
WBC: 10.8 10*3/uL (ref 3.4–10.8)

## 2018-11-07 LAB — BETA HCG QUANT (REF LAB): hCG Quant: <1 m[IU]/mL

## 2018-11-07 LAB — RPR, QUANT. (REFLEX): Rapid Plasma Reagin, Quant: 1:1 {titer} — ABNORMAL HIGH

## 2018-11-07 LAB — HEP, RPR, HIV PANEL
HIV Screen 4th Generation wRfx: NONREACTIVE
Hepatitis B Surface Ag: NEGATIVE
RPR Ser Ql: REACTIVE — AB

## 2018-11-07 LAB — HEPATITIS C ANTIBODY: Hep C Virus Ab: <0.1 s/co ratio (ref 0.0–0.9)

## 2018-11-08 ENCOUNTER — Ambulatory Visit (INDEPENDENT_AMBULATORY_CARE_PROVIDER_SITE_OTHER): Payer: BLUE CROSS/BLUE SHIELD

## 2018-11-08 ENCOUNTER — Telehealth: Payer: Self-pay

## 2018-11-08 ENCOUNTER — Ambulatory Visit: Payer: BLUE CROSS/BLUE SHIELD | Admitting: Obstetrics and Gynecology

## 2018-11-08 ENCOUNTER — Encounter: Payer: Self-pay | Admitting: Obstetrics and Gynecology

## 2018-11-08 VITALS — BP 128/86 | HR 68 | Wt 333.0 lb

## 2018-11-08 DIAGNOSIS — R102 Pelvic and perineal pain: Secondary | ICD-10-CM | POA: Diagnosis not present

## 2018-11-08 DIAGNOSIS — Z8619 Personal history of other infectious and parasitic diseases: Secondary | ICD-10-CM | POA: Diagnosis not present

## 2018-11-08 DIAGNOSIS — R109 Unspecified abdominal pain: Secondary | ICD-10-CM | POA: Diagnosis not present

## 2018-11-08 NOTE — Telephone Encounter (Signed)
Spoke with patient. Advised spoke with the [facility] regarding her RPR testing. Since her ratio is 1:1 no concern for recent exposure. Is this ratio was 1:4 or higher would need additional management. Advised partners should have testing if possible. Review with Dr.Jertson who is agreeable. Advised we are still awaiting her Chlamydia, gonorrhea, and trichomonas testing to return and we will be in contact with her as soon as these results are in. Patient verbalizes understanding.  Routing to provider and will close encounter.

## 2018-11-08 NOTE — Progress Notes (Signed)
GYNECOLOGY  VISIT   HPI: 38 y.o.   Divorced White or Caucasian Hispanic or Latino  female   [id number] with No LMP recorded. (Menstrual status: IUD).   here for consult following PUS.  The patient was seen the other day with pelvic pain. Currently her pain is better.   GYNECOLOGIC HISTORY: No LMP recorded. (Menstrual status: IUD). Contraception: IUD Menopausal hormone therapy: None        OB History    Gravida  4   Para  2   Term  2   Preterm      AB  2   Living  2     SAB  1   TAB      Ectopic  1   Multiple      Live Births  2              Patient Active Problem List   Diagnosis Date Noted  . 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 Throat irritation 10/20/2017  . Mixed dyslipidemia 11/01/2016  . Severe obesity (BMI >= 40) (HCC) 11/01/2016  . Smoking 11/01/2016  . Vitamin D deficiency 11/01/2016  . Anxiety state 08/16/2013  . Fetal macrosomia in pregnancy in third trimester, antepartum 07/11/2013  . Chronic hypertension complicating or reason for care during pregnancy 06/24/2013  . Hypersomnia 06/24/2013  . Diabetes mellitus, antepartum(648.03) 02/05/2013  . Obesity complicating peripregnancy, antepartum 02/05/2013  . Pregnancy 01/29/2013  . Esophageal reflux 01/04/2013  . Hypertension, essential 01/01/2013  . Type 2 diabetes mellitus with hyperglycemia, with long-term current use of insulin (HCC) 01/01/2013  . Pregnancy with history of ectopic pregnancy in first trimester 01/01/2013  . Depressive disorder 10/03/2012  . Venous (peripheral) insufficiency 10/03/2012    Past Medical History:  Diagnosis Date  . Abnormal Pap smear 2005   ASCUS +HPV  . Anxiety   . Anxiety and depression   . Depression   . Diabetes mellitus without complication (HCC)   . Genital warts   . Hypertension   . Syphilis in female     Past Surgical History:  Procedure Laterality Date  . CHOLECYSTECTOMY  6/10   with liver infection, also occluded bile duct    Current Outpatient  Medications  Medication Sig Dispense Refill  . ACCU-CHEK FASTCLIX LANCETS MISC 1 Units by Percutaneous route 4 (four) times daily. 100 each 12  . atorvastatin (LIPITOR) 10 MG tablet Take 10 mg by mouth daily.    . Dapagliflozin-metFORMIN HCl ER (XIGDUO XR) 11-998 MG TB24 Take by mouth.    . Dulaglutide 1.5 MG/0.5ML SOPN (Trulicity) Inject 1.5 mg weekly for diabetes    . glucose blood (BAYER CONTOUR TEST) test strip Check blood sugar level 4 times daily AM and 2 hours PP 100 each 12  . Insulin Degludec (TRESIBA FLEXTOUCH [place]) Inject into the skin. 60 units    . levonorgestrel (MIRENA) 20 MCG/24HR IUD by Intrauterine route.    . metroNIDAZOLE (FLAGYL) 500 MG tablet Take 1 tablet (500 mg total) by mouth 2 (two) times daily. 14 tablet 0  . pantoprazole (PROTONIX) 40 MG tablet Take 40 mg by mouth daily.     . pioglitazone (ACTOS) 30 MG tablet Take 1 tablet once a day for diabetes    . sertraline (ZOLOFT) 100 MG tablet Take 150 mg by mouth at bedtime.     Marland Kitchen. VITAMIN D PO Take 5,000 Int'l Units by mouth.     No current facility-administered medications for this visit.      ALLERGIES: Neosporin [  neomycin-polymyxin-gramicidin]  Family History  Problem Relation Age of Onset  . Cancer Father        lung  . Hypertension Father   . Stroke Father   . Heart disease Father   . Hypertension Mother   . Diabetes Paternal Grandmother   . Cancer Paternal Grandfather        prostate    Social History   Socioeconomic History  . Marital status: Divorced    Spouse name: Not on file  . Number of children: Not on file  . Years of education: Not on file  . Highest education level: Not on file  Occupational History  . Not on file  Social Needs  . Financial resource strain: Not on file  . Food insecurity:    Worry: Not on file    Inability: Not on file  . Transportation needs:    Medical: Not on file    Non-medical: Not on file  Tobacco Use  . Smoking status: Current Every Day Smoker    Types:  Cigarettes    Last attempt to quit: 12/02/2012    Years since quitting: 5.9  . Smokeless tobacco: Never Used  . Tobacco comment: less than 1/2 a pack a day  Substance and Sexual Activity  . Alcohol use: Yes    Alcohol/week: 0.0 standard drinks    Comment: occ  . Drug use: No  . Sexual activity: Yes    Partners: Male    Birth control/protection: I.U.D.  Lifestyle  . Physical activity:    Days per week: Not on file    Minutes per session: Not on file  . Stress: Not on file  Relationships  . Social connections:    Talks on phone: Not on file    Gets together: Not on file    Attends religious service: Not on file    Active member of club or organization: Not on file    Attends meetings of clubs or organizations: Not on file    Relationship status: Not on file  . Intimate partner violence:    Fear of current or ex partner: Not on file    Emotionally abused: Not on file    Physically abused: Not on file    Forced sexual activity: Not on file  Other Topics Concern  . Not on file  Social History Narrative  . Not on file    Review of Systems  Constitutional:       Nipple tenderness  HENT: Negative.   Eyes: Negative.   Respiratory: Negative.   Cardiovascular: Negative.   Gastrointestinal: Negative.   Genitourinary:       Pelvic pain/cramping  Musculoskeletal: Negative.   Skin: Negative.   Neurological: Negative.   Endo/Heme/Allergies: Negative.   Psychiatric/Behavioral: Negative.     PHYSICAL EXAMINATION:    BP 128/86 (BP Location: Left Arm, Patient Position: Sitting, Cuff Size: Large)   Pulse 68   Wt (!) 333 lb (151 kg)   BMI 54.99 kg/m     General appearance: alert, cooperative and appears stated age   Reviewed ultrasound images with the patient, normal, IUD in place  ASSESSMENT Pelvic pain, imaging normal, feeling better today H/O Syphilis, treated previously, RPR reactive, titer only 1:1. Last RPR in 11/19 was negative.  Other blood work is negative,  cervical cultures are pending.      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 The patient has had a new sexual partner without protection Awaiting cervical cultures.   Addendum: according to the health department as long as her titer doesn't reach 1:4, they would not consider it a new infection.      An After Visit Summary was printed and given to the patient.

## 2018-11-13 LAB — CHLAMYDIA/GONOCOCCUS/TRICHOMONAS, NAA
Chlamydia by NAA: NEGATIVE
Gonococcus by NAA: NEGATIVE
Trich vag by NAA: NEGATIVE

## 2018-11-30 ENCOUNTER — Ambulatory Visit: Payer: BLUE CROSS/BLUE SHIELD | Admitting: Certified Nurse Midwife

## 2018-12-05 ENCOUNTER — Emergency Department (HOSPITAL_COMMUNITY)
Admission: EM | Admit: 2018-12-05 | Discharge: 2018-12-05 | Disposition: A | Discharge status: Home / Self Care | Payer: BLUE CROSS/BLUE SHIELD | Attending: Emergency Medicine | Admitting: Emergency Medicine

## 2018-12-05 ENCOUNTER — Emergency Department (HOSPITAL_COMMUNITY): Payer: BLUE CROSS/BLUE SHIELD

## 2018-12-05 ENCOUNTER — Other Ambulatory Visit: Payer: Self-pay

## 2018-12-05 ENCOUNTER — Encounter (HOSPITAL_COMMUNITY): Payer: Self-pay

## 2018-12-05 DIAGNOSIS — R1010 Upper abdominal pain, unspecified: Secondary | ICD-10-CM

## 2018-12-05 DIAGNOSIS — Z7984 Long term (current) use of oral hypoglycemic drugs: Secondary | ICD-10-CM | POA: Insufficient documentation

## 2018-12-05 DIAGNOSIS — R6881 Early satiety: Secondary | ICD-10-CM | POA: Diagnosis not present

## 2018-12-05 DIAGNOSIS — E119 Type 2 diabetes mellitus without complications: Secondary | ICD-10-CM | POA: Diagnosis not present

## 2018-12-05 DIAGNOSIS — R1013 Epigastric pain: Secondary | ICD-10-CM | POA: Diagnosis present

## 2018-12-05 DIAGNOSIS — Z79899 Other long term (current) drug therapy: Secondary | ICD-10-CM | POA: Diagnosis not present

## 2018-12-05 DIAGNOSIS — R10812 Left upper quadrant abdominal tenderness: Secondary | ICD-10-CM | POA: Diagnosis not present

## 2018-12-05 DIAGNOSIS — F17210 Nicotine dependence, cigarettes, uncomplicated: Secondary | ICD-10-CM | POA: Diagnosis not present

## 2018-12-05 DIAGNOSIS — R10811 Right upper quadrant abdominal tenderness: Secondary | ICD-10-CM | POA: Insufficient documentation

## 2018-12-05 DIAGNOSIS — R112 Nausea with vomiting, unspecified: Secondary | ICD-10-CM | POA: Insufficient documentation

## 2018-12-05 DIAGNOSIS — I10 Essential (primary) hypertension: Secondary | ICD-10-CM | POA: Insufficient documentation

## 2018-12-05 LAB — CBC WITH DIFFERENTIAL/PLATELET
Abs Immature Granulocytes: 0.06 10*3/uL (ref 0.00–0.07)
Basophils Absolute: 0.1 10*3/uL (ref 0.0–0.1)
Basophils Relative: 1 %
Eosinophils Absolute: 0.2 10*3/uL (ref 0.0–0.5)
Eosinophils Relative: 2 %
HCT: 43.5 % (ref 36.0–46.0)
Hemoglobin: 13.7 g/dL (ref 12.0–15.0)
Immature Granulocytes: 1 %
Lymphocytes Relative: 43 %
Lymphs Abs: 4.7 10*3/uL — ABNORMAL HIGH (ref 0.7–4.0)
MCH: 28.2 pg (ref 26.0–34.0)
MCHC: 31.5 g/dL (ref 30.0–36.0)
MCV: 89.5 fL (ref 80.0–100.0)
Monocytes Absolute: 0.7 10*3/uL (ref 0.1–1.0)
Monocytes Relative: 7 %
Neutro Abs: 5.1 10*3/uL (ref 1.7–7.7)
Neutrophils Relative %: 46 %
Platelets: 285 10*3/uL (ref 150–400)
RBC: 4.86 MIL/uL (ref 3.87–5.11)
RDW: 12.6 % (ref 11.5–15.5)
WBC: 10.8 10*3/uL — ABNORMAL HIGH (ref 4.0–10.5)
nRBC: 0 % (ref 0.0–0.2)

## 2018-12-05 LAB — COMPREHENSIVE METABOLIC PANEL
ALT: 21 U/L (ref 0–44)
AST: 12 U/L — ABNORMAL LOW (ref 15–41)
Albumin: 3.7 g/dL (ref 3.5–5.0)
Alkaline Phosphatase: 110 U/L (ref 38–126)
Anion gap: 9 (ref 5–15)
BUN: 13 mg/dL (ref 6–20)
CO2: 23 mmol/L (ref 22–32)
Calcium: 8.7 mg/dL — ABNORMAL LOW (ref 8.9–10.3)
Chloride: 103 mmol/L (ref 98–111)
Creatinine, Ser: 0.85 mg/dL (ref 0.44–1.00)
GFR calc Af Amer: >60 mL/min (ref >60)
GFR calc non Af Amer: >60 mL/min (ref >60)
Glucose, Bld: 301 mg/dL — ABNORMAL HIGH (ref 70–99)
Potassium: 3.7 mmol/L (ref 3.5–5.1)
Sodium: 135 mmol/L (ref 135–145)
Total Bilirubin: 0.3 mg/dL (ref 0.3–1.2)
Total Protein: 7.2 g/dL (ref 6.5–8.1)

## 2018-12-05 LAB — I-STAT BETA HCG BLOOD, ED (MC, WL, AP ONLY): I-stat hCG, quantitative: <5 m[IU]/mL (ref <5)

## 2018-12-05 LAB — LIPASE, BLOOD: Lipase: 26 U/L (ref 11–51)

## 2018-12-05 MED ORDER — METOCLOPRAMIDE HCL 10 MG PO TABS: 10.0000 mg | ORAL_TABLET | Freq: Four times a day (QID) | ORAL | 0 refills | Status: DC

## 2018-12-05 MED ORDER — HYDROMORPHONE HCL 1 MG/ML IJ SOLN
0.5000 mg | Freq: Once | INTRAMUSCULAR | Status: AC
  Administered 2018-12-05: 02:00:00 0.5 mg via INTRAVENOUS
  Filled 2018-12-05: qty 1

## 2018-12-05 MED ORDER — ONDANSETRON HCL 4 MG/2ML IJ SOLN
4.0000 mg | Freq: Once | INTRAMUSCULAR | Status: AC
  Administered 2018-12-05: 02:00:00 4 mg via INTRAVENOUS
  Filled 2018-12-05: qty 2

## 2018-12-05 MED ORDER — SODIUM CHLORIDE 0.9 % IV BOLUS
1000.0000 mL | Freq: Once | INTRAVENOUS | Status: AC
  Administered 2018-12-05: 02:00:00 1000 mL via INTRAVENOUS

## 2018-12-05 MED ORDER — DICYCLOMINE HCL 20 MG PO TABS: 20.0000 mg | ORAL_TABLET | Freq: Two times a day (BID) | ORAL | 0 refills | Status: DC

## 2018-12-05 MED ORDER — IOHEXOL 300 MG/ML  SOLN
100.0000 mL | Freq: Once | INTRAMUSCULAR | Status: AC | PRN
  Administered 2018-12-05: 100 mL via INTRAVENOUS

## 2018-12-05 MED ORDER — HYDROMORPHONE HCL 1 MG/ML IJ SOLN
1.0000 mg | Freq: Once | INTRAMUSCULAR | Status: AC
  Administered 2018-12-05: 02:00:00 1 mg via INTRAVENOUS
  Filled 2018-12-05: qty 1

## 2018-12-05 NOTE — ED Provider Notes (Signed)
[facility] EMERGENCY DEPARTMENT Provider Note   CSN: [id number] Arrival date & time: 12/05/18  0125    History   Chief Complaint Chief Complaint  Patient presents with   Abdominal Pain   Nausea    HPI Diamond Pace is a 38 y.o. female.     Patient to ED with symptoms of intermittent fullness in the epigastrium, followed by pain that starts in the epigastrium, radiates to the back and then goes across the upper abdomen associated with nausea and vomiting. No hematemesis. Symptoms started about one week ago. She states for the past month she has had difficulty eating because she get full with smaller amounts of food. No fever or diarrhea. History of cholecystectomy. She has a hard time taking a deep breath when the pain is present but denies cough or chest pain. She states she has lost weight in the last month but does not know how much. No urinary symptoms.  The history is provided by the patient. No language interpreter was used.  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   Throat irritation 10/20/2017   Mixed dyslipidemia 11/01/2016   Severe obesity (BMI >= 40) (HCC) 11/01/2016   Smoking 11/01/2016   Vitamin D deficiency 11/01/2016   Anxiety state 08/16/2013   Fetal macrosomia in pregnancy in third trimester, antepartum 07/11/2013   Chronic hypertension complicating or reason for care during pregnancy 06/24/2013   Hypersomnia 06/24/2013   Diabetes mellitus, antepartum(648.03) 02/05/2013   Obesity  complicating peripregnancy, antepartum 02/05/2013   Pregnancy 01/29/2013   Esophageal reflux 01/04/2013   Hypertension, essential 01/01/2013   Type 2 diabetes mellitus with hyperglycemia, with long-term current use of insulin (HCC) 01/01/2013   Pregnancy with history of ectopic pregnancy in first trimester 01/01/2013   Depressive disorder 10/03/2012   Venous (peripheral) insufficiency 10/03/2012    Past Surgical History:  Procedure Laterality Date   CHOLECYSTECTOMY  6/10   with liver infection, also occluded bile duct     OB History    Gravida  4   Para  2   Term  2   Preterm      AB  2   Living  2     SAB  1   TAB      Ectopic  1   Multiple      Live Births  2            Home Medications    Prior to Admission medications   Medication Sig Start Date End Date Taking? Authorizing Provider  ACCU-CHEK FASTCLIX LANCETS MISC 1 Units by Percutaneous route 4 (four) times daily. 03/04/13   Poe, Deirdre C, CNM  atorvastatin (LIPITOR) 10 MG tablet Take 10 mg by mouth daily.    [provider]  Dapagliflozin-metFORMIN HCl ER (XIGDUO XR) 11-998 MG TB24 Take by mouth. 01/12/18   [provider]  Dulaglutide 1.5 MG/0.5ML SOPN (Trulicity) Inject 1.5 mg weekly for diabetes 11/16/15   [provider]  glucose blood (BAYER CONTOUR TEST) test strip Check blood sugar level 4 times daily AM and 2 hours PP 03/04/13   Poe, Deirdre C, CNM  Insulin Degludec (TRESIBA FLEXTOUCH [place]) Inject into the skin. 60 units    [provider]  levonorgestrel (MIRENA) 20 MCG/24HR IUD by Intrauterine route.    [provider]  metroNIDAZOLE (FLAGYL) 500 MG tablet Take 1 tablet (500 mg total) by mouth 2 (two) times daily. 11/06/18   Romualdo BolkJertson, Jill Evelyn, MD  pantoprazole (PROTONIX) 40 MG tablet Take 40 mg by mouth daily.     [provider]  pioglitazone (ACTOS) 30 MG tablet Take 1 tablet once a day for diabetes 11/01/16   [provider]  sertraline (ZOLOFT) 100 MG tablet Take 150 mg by mouth at bedtime.     [provider]  VITAMIN D PO Take 5,000 Int'l Units by mouth.    [provider]    Family History Family History  Problem Relation Age of Onset   Cancer Father        lung   Hypertension Father    Stroke Father    Heart disease Father    Hypertension Mother    Diabetes Paternal Grandmother    Cancer Paternal Grandfather        prostate    Social History Social History   Tobacco Use   Smoking status: Current Every Day Smoker    Types: Cigarettes    Last attempt to quit: 12/02/2012    Years since quitting: 6.0   Smokeless tobacco: Never Used   Tobacco comment: less than 1/2 a pack a day  Substance Use Topics   Alcohol use: Yes    Alcohol/week: 0.0 standard drinks    Comment: occ   Drug use: No     Allergies   Neosporin [neomycin-polymyxin-gramicidin]   Review of Systems Review of Systems  Constitutional: Negative for chills and fever.  Respiratory: Negative.  Negative for cough.   Cardiovascular: Negative.  Negative for chest pain.  Gastrointestinal: Positive for abdominal pain, nausea and vomiting. Negative for diarrhea.  Genitourinary: Negative.   Musculoskeletal: Negative.   Skin: Negative.   Neurological: Negative.      Physical Exam Updated Vital Signs BP 132/85 (BP Location: Right Arm)    Pulse 89    Temp 98.4 F (36.9 C) (Oral)    Resp 20    SpO2 100%   Physical Exam Vitals signs and nursing note reviewed.  Constitutional:      Appearance: She is well-developed.     Comments: Uncomfortable in appearance.  HENT:     Head: Normocephalic.  Neck:     Musculoskeletal: Normal range of motion and neck supple.  Cardiovascular:     Rate and Rhythm: Normal rate and regular rhythm.  Pulmonary:     Effort: Pulmonary effort is normal.     Breath sounds: Normal breath sounds.  Abdominal:     Palpations: Abdomen is soft.     Tenderness: There is  abdominal tenderness in the right upper quadrant, epigastric area and left upper quadrant. There is no guarding or rebound.  Musculoskeletal: Normal range of motion.  Skin:    General: Skin is warm and dry.     Findings: No rash.  Neurological:     Mental Status: She is alert.     Cranial Nerves: No cranial nerve deficit.      ED Treatments / Results  Labs (all labs ordered are listed,  but only abnormal results are displayed) Labs Reviewed  CBC WITH DIFFERENTIAL/PLATELET  COMPREHENSIVE METABOLIC PANEL  LIPASE, BLOOD  I-STAT BETA HCG BLOOD, ED ([facility], [facility], [facility] ONLY)   Results for orders placed or performed during the hospital encounter of 12/05/18  CBC with Differential  Result Value Ref Range   WBC 10.8 (H) 4.0 - 10.5 K/uL   RBC 4.86 3.87 - 5.11 MIL/uL   Hemoglobin 13.7 12.0 - 15.0 g/dL   HCT 40.9 81.1 - 91.4 %   MCV 89.5 80.0 - 100.0 fL   MCH 28.2 26.0 - 34.0 pg   MCHC 31.5 30.0 - 36.0 g/dL   RDW 78.2 95.6 - 21.3 %   Platelets 285 150 - 400 K/uL   nRBC 0.0 0.0 - 0.2 %   Neutrophils Relative % 46 %   Neutro Abs 5.1 1.7 - 7.7 K/uL   Lymphocytes Relative 43 %   Lymphs Abs 4.7 (H) 0.7 - 4.0 K/uL   Monocytes Relative 7 %   Monocytes Absolute 0.7 0.1 - 1.0 K/uL   Eosinophils Relative 2 %   Eosinophils Absolute 0.2 0.0 - 0.5 K/uL   Basophils Relative 1 %   Basophils Absolute 0.1 0.0 - 0.1 K/uL   Immature Granulocytes 1 %   Abs Immature Granulocytes 0.06 0.00 - 0.07 K/uL  Comprehensive metabolic panel  Result Value Ref Range   Sodium 135 135 - 145 mmol/L   Potassium 3.7 3.5 - 5.1 mmol/L   Chloride 103 98 - 111 mmol/L   CO2 23 22 - 32 mmol/L   Glucose, Bld 301 (H) 70 - 99 mg/dL   BUN 13 6 - 20 mg/dL   Creatinine, Ser 0.86 0.44 - 1.00 mg/dL   Calcium 8.7 (L) 8.9 - 10.3 mg/dL   Total Protein 7.2 6.5 - 8.1 g/dL   Albumin 3.7 3.5 - 5.0 g/dL   AST 12 (L) 15 - 41 U/L   ALT 21 0 - 44 U/L   Alkaline Phosphatase 110 38 - 126 U/L   Total Bilirubin 0.3 0.3 - 1.2 mg/dL   GFR calc  non Af Amer >60 >60 mL/min   GFR calc Af Amer >60 >60 mL/min   Anion gap 9 5 - 15  Lipase, blood  Result Value Ref Range   Lipase 26 11 - 51 U/L  I-Stat beta hCG blood, ED  Result Value Ref Range   I-stat hCG, quantitative <5.0 <5 mIU/mL   Comment 3            EKG None  Radiology No results found. US Pelvis Transvanginal Non-ob (tv Only)  Result Date: 11/08/2018 SEE PROGRESS NOTE  Ct Abdomen Pelvis W Contrast  Result Date: 12/05/2018 CLINICAL DATA:  38 year old female with abdominal pain nausea and vomiting for 1 week. EXAM: CT ABDOMEN AND PELVIS WITH CONTRAST TECHNIQUE: Multidetector CT imaging of the abdomen and pelvis was performed using the standard protocol following bolus administration of intravenous contrast. CONTRAST:  OMNIPAQUE IOHEXOL 300 MG/ML  SOLN COMPARISON:  Pelvis/Ob ultrasounds 11/08/2018 and earlier. FINDINGS: Lower chest: Dependent ground-glass and mosaic attenuation likely reflecting atelectasis. Middle lobes appear more clear. Cardiac size at the upper limits of normal. No pericardial or pleural effusion. Hepatobiliary: Surgically absent gallbladder. Negative liver. Pancreas: Negative. Spleen: Negative. Adrenals/Urinary Tract: Normal adrenal glands. Symmetric renal enhancement. No hydronephrosis or nephrolithiasis. No perinephric stranding. Proximal ureters are decompressed. Distal ureters also appear normal. Incidental pelvic phleboliths. Unremarkable urinary bladder. Stomach/Bowel: Negative rectosigmoid colon, mild redundancy. Mild retained stool from  the descending to the right colon. No large bowel inflammation. Normal appendix (coronal image 49). There is a tiny calcified or metallic object in the right lower quadrant adjacent to the appendix on coronal image 55, possibly a gonadal vein phlebolith. Negative terminal ileum. No dilated small bowel. Negative stomach and duodenum. No free air, free fluid. Vascular/Lymphatic: Suboptimal intravascular contrast bolus.  The major arterial structures appear to be patent. Portal venous system appears grossly patent. No lymphadenopathy. Reproductive: Negative; IUD in place. Other: No pelvic free fluid. Musculoskeletal: No acute osseous abnormality identified. IMPRESSION: No acute or inflammatory process in the abdomen or pelvis. Normal appendix.  IUD in place. Electronically Signed   By: Odessa Fleming M.D.   On: 12/05/2018 04:08    Procedures Procedures (including critical care time)  Medications Ordered in ED Medications  HYDROmorphone (DILAUDID) injection 1 mg (has no administration in time range)  ondansetron (ZOFRAN) injection 4 mg (has no administration in time range)     Initial Impression / Assessment and Plan / ED Course  I have reviewed the triage vital signs and the nursing notes.  Pertinent labs & imaging results that were available during my care of the patient were reviewed by me and considered in my medical decision making (see chart for details).        Patient to ED with complaint of one month of early satiety, one week of intermittent epigastric and upper abdominal with nausea and vomiting. No hematemesis, fever.   She denies history of pancreatitis but is treated for DM and HLD. Will obtain labs, treat symptoms, IVF's.   Pain medications help, requesting second dose. Nausea is resolved. Labs do not suggest a cause of pain/symptoms. CT ordered for full evaluation.   CT negative for acute findings. Patient remains comfortable. Discussed discharge home and GI follow up. Will start on Reglan and Bentyl.   Final Clinical Impressions(s) / ED Diagnoses   Final diagnoses:  None   1. Abdominal pain 2. Early satiety  ED Discharge Orders    None       Elpidio Anis, PA-C 12/05/18 0531    Shon Baton, MD 12/05/18 [phone]

## 2018-12-05 NOTE — ED Triage Notes (Signed)
Pt reports intermittent epigastric pain x1 week. Pain is sharp in nature and radiates to her back. Pt has been taking antacids without relief of pain. She reports associated nausea and vomiting. VSS, pt afebrile.

## 2018-12-05 NOTE — Discharge Instructions (Addendum)
Take medications for symptoms as prescribed. Please call [facility] to make an appointment for further outpatient evaluation of fullness and upper abdominal pain.

## 2018-12-06 ENCOUNTER — Encounter (HOSPITAL_COMMUNITY): Payer: Self-pay | Admitting: *Deleted

## 2018-12-06 ENCOUNTER — Emergency Department (HOSPITAL_COMMUNITY)
Admission: EM | Admit: 2018-12-06 | Discharge: 2018-12-06 | Disposition: A | Discharge status: Home / Self Care | Payer: BLUE CROSS/BLUE SHIELD | Attending: Emergency Medicine | Admitting: Emergency Medicine

## 2018-12-06 ENCOUNTER — Other Ambulatory Visit: Payer: Self-pay

## 2018-12-06 DIAGNOSIS — R109 Unspecified abdominal pain: Secondary | ICD-10-CM | POA: Diagnosis present

## 2018-12-06 DIAGNOSIS — Z5321 Procedure and treatment not carried out due to patient leaving prior to being seen by health care provider: Secondary | ICD-10-CM | POA: Insufficient documentation

## 2018-12-06 LAB — URINALYSIS, ROUTINE W REFLEX MICROSCOPIC
Bacteria, UA: NONE SEEN
Bilirubin Urine: NEGATIVE
Glucose, UA: >=500 mg/dL — AB
Hgb urine dipstick: NEGATIVE
Ketones, ur: NEGATIVE mg/dL
Leukocytes,Ua: NEGATIVE
Nitrite: NEGATIVE
Protein, ur: NEGATIVE mg/dL
Specific Gravity, Urine: 1.035 — ABNORMAL HIGH (ref 1.005–1.030)
pH: 5 (ref 5.0–8.0)

## 2018-12-06 LAB — CBC
HCT: 42.7 % (ref 36.0–46.0)
Hemoglobin: 13.6 g/dL (ref 12.0–15.0)
MCH: 28.3 pg (ref 26.0–34.0)
MCHC: 31.9 g/dL (ref 30.0–36.0)
MCV: 89 fL (ref 80.0–100.0)
Platelets: 295 10*3/uL (ref 150–400)
RBC: 4.8 MIL/uL (ref 3.87–5.11)
RDW: 12.4 % (ref 11.5–15.5)
WBC: 9 10*3/uL (ref 4.0–10.5)
nRBC: 0 % (ref 0.0–0.2)

## 2018-12-06 LAB — COMPREHENSIVE METABOLIC PANEL
ALT: 18 U/L (ref 0–44)
AST: 13 U/L — ABNORMAL LOW (ref 15–41)
Albumin: 3.7 g/dL (ref 3.5–5.0)
Alkaline Phosphatase: 88 U/L (ref 38–126)
Anion gap: 11 (ref 5–15)
BUN: 12 mg/dL (ref 6–20)
CO2: 23 mmol/L (ref 22–32)
Calcium: 9.1 mg/dL (ref 8.9–10.3)
Chloride: 100 mmol/L (ref 98–111)
Creatinine, Ser: 0.69 mg/dL (ref 0.44–1.00)
GFR calc Af Amer: >60 mL/min (ref >60)
GFR calc non Af Amer: >60 mL/min (ref >60)
Glucose, Bld: 365 mg/dL — ABNORMAL HIGH (ref 70–99)
Potassium: 4 mmol/L (ref 3.5–5.1)
Sodium: 134 mmol/L — ABNORMAL LOW (ref 135–145)
Total Bilirubin: 0.4 mg/dL (ref 0.3–1.2)
Total Protein: 7.4 g/dL (ref 6.5–8.1)

## 2018-12-06 LAB — LIPASE, BLOOD: Lipase: 28 U/L (ref 11–51)

## 2018-12-06 MED ORDER — SODIUM CHLORIDE 0.9% FLUSH: 3.0000 mL | Freq: Once | INTRAVENOUS | Status: DC

## 2018-12-06 NOTE — ED Triage Notes (Signed)
Pt arrives with c/o sharp abdominal pain that has been going on for about a week. Emesis with intense pain. Pt says she was seen here last night for the same, the pain is not any better "its worse". She called the GI doctor and left a message for an appointment. No cough. SOB with intense pain. Nausea. No fevers. Only recent travel was to [place].

## 2018-12-06 NOTE — ED Notes (Signed)
Pt expresses desire to leave. Does not want to wait. Pt encouraged to stay. Pt refuses and will come back if gets worse. Pt seen leaving lobby to parking lot.

## 2018-12-11 ENCOUNTER — Ambulatory Visit: Payer: BLUE CROSS/BLUE SHIELD | Admitting: Gastroenterology

## 2019-05-16 ENCOUNTER — Other Ambulatory Visit: Payer: Self-pay

## 2019-05-20 ENCOUNTER — Encounter: Payer: Self-pay | Admitting: Certified Nurse Midwife

## 2019-05-20 ENCOUNTER — Ambulatory Visit: Payer: BC Managed Care – PPO | Admitting: Certified Nurse Midwife

## 2019-05-20 ENCOUNTER — Other Ambulatory Visit (HOSPITAL_COMMUNITY)
Admission: RE | Admit: 2019-05-20 | Discharge: 2019-05-20 | Disposition: A | Discharge status: Home / Self Care | Payer: BC Managed Care – PPO | Source: Ambulatory Visit | Attending: Certified Nurse Midwife | Admitting: Certified Nurse Midwife

## 2019-05-20 ENCOUNTER — Other Ambulatory Visit: Payer: Self-pay

## 2019-05-20 ENCOUNTER — Telehealth: Payer: Self-pay | Admitting: *Deleted

## 2019-05-20 VITALS — BP 122/80 | HR 68 | Temp 97.2°F | Resp 16 | Ht 65.25 in | Wt 343.0 lb

## 2019-05-20 DIAGNOSIS — Z789 Other specified health status: Secondary | ICD-10-CM

## 2019-05-20 DIAGNOSIS — Z124 Encounter for screening for malignant neoplasm of cervix: Secondary | ICD-10-CM | POA: Insufficient documentation

## 2019-05-20 DIAGNOSIS — Z01419 Encounter for gynecological examination (general) (routine) without abnormal findings: Secondary | ICD-10-CM | POA: Diagnosis not present

## 2019-05-20 DIAGNOSIS — Z30431 Encounter for routine checking of intrauterine contraceptive device: Secondary | ICD-10-CM | POA: Diagnosis not present

## 2019-05-20 DIAGNOSIS — Z794 Long term (current) use of insulin: Secondary | ICD-10-CM

## 2019-05-20 DIAGNOSIS — E1165 Type 2 diabetes mellitus with hyperglycemia: Secondary | ICD-10-CM

## 2019-05-20 DIAGNOSIS — Z30432 Encounter for removal of intrauterine contraceptive device: Secondary | ICD-10-CM

## 2019-05-20 NOTE — Progress Notes (Signed)
38 y.o. [id number] Divorced  Hispanic Fe here for annual exam. Contraception Mirena IUD desires removal to try for pregnancy with partner.. Periods monthly now with spotting only. Diagnosed with Bipolar and now on medication and feeling so much better on Prozac with [facility] for diabetes management. No STD screening needed today, no partner change. Aware she will need high risk care due to IDDM history. Has been working on weight loss dow 10 pounds. No other health issues today.  No LMP recorded. (Menstrual status: IUD).          Sexually active: Yes.    The current method of family planning is IUD.    Exercising: No.  exercise Smoker:  no  Review of Systems  Constitutional: Negative.   HENT: Negative.   Eyes: Negative.   Respiratory: Negative.   Cardiovascular: Negative.   Gastrointestinal: Negative.   Genitourinary: Negative.   Musculoskeletal: Negative.   Skin: Negative.   Neurological: Negative.   Endo/Heme/Allergies: Negative.   Psychiatric/Behavioral: Negative.     Health Maintenance: Pap:  04-22-16 neg HPV HR neg History of Abnormal Pap: yes MMG:  none Self Breast exams: not done Colonoscopy:  none BMD:   none TDaP:  2015 Shingles: no Pneumonia: 2015 Hep C and HIV: both neg 10/2018 Labs: yes   reports that she has been smoking cigarettes. She has never used smokeless tobacco. She reports previous alcohol use. She reports that she does not use drugs.  Past Medical History:  Diagnosis Date  . Abnormal Pap smear 2005   ASCUS +HPV  . Anxiety   . Anxiety and depression   . Depression   . Diabetes mellitus without complication ([facility])   . Genital warts   . Hypertension   . Manic bipolar I disorder ([place])   . Syphilis in female     Past Surgical History:  Procedure Laterality Date  . CHOLECYSTECTOMY  6/10   with liver infection, also occluded bile duct  . INTRAUTERINE DEVICE INSERTION     11-24-15 mirena iud inserted    Current Outpatient  Medications  Medication Sig Dispense Refill  . ACCU-CHEK FASTCLIX LANCETS MISC 1 Units by Percutaneous route 4 (four) times daily. 100 each 12  . atorvastatin (LIPITOR) 10 MG tablet Take 10 mg by mouth daily.    Marland Kitchen azelastine (OPTIVAR) 0.05 % ophthalmic solution Use as directed as needed for allergies    . Dapagliflozin-metFORMIN HCl ER (XIGDUO XR) 11-998 MG TB24 Take by mouth.    . Dulaglutide 1.5 RC/7.8LF SOPN (Trulicity) Inject 1.5 mg weekly for diabetes    . FLUoxetine (PROZAC) 10 MG tablet Take 2 tablets once a day    . glucose blood (BAYER CONTOUR TEST) test strip Check blood sugar level 4 times daily AM and 2 hours PP 100 each 12  . Insulin Degludec (TRESIBA FLEXTOUCH [place]) Inject into the skin. 60 units    . levonorgestrel (MIRENA) 20 MCG/24HR IUD by Intrauterine route.    . RABEprazole (ACIPHEX) 20 MG tablet Take by mouth.    Marland Kitchen VITAMIN D PO Take 5,000 Int'l Units by mouth.    . pioglitazone (ACTOS) 30 MG tablet Take 1 tablet once a day for diabetes     No current facility-administered medications for this visit.     Family History  Problem Relation Age of Onset  . Cancer Father        lung  . Hypertension Father   . Stroke Father   . Heart disease Father   .  Hypertension Mother   . Diabetes Paternal Grandmother   . Cancer Paternal Grandfather        prostate    ROS:  Pertinent items are noted in HPI.  Otherwise, a comprehensive ROS was negative.  Exam:   BP 122/80   Pulse 68   Temp (!) 97.2 F (36.2 C) (Skin)   Resp 16   Ht 5' 5.25" (1.657 m)   Wt (!) 343 lb (155.6 kg)   BMI 56.64 kg/m  Height: 5' 5.25" (165.7 cm) Ht Readings from Last 3 Encounters:  05/20/19 5' 5.25" (1.657 m)  12/06/18 5\' 5"  (1.651 m)  03/16/18 5' 5.25" (1.657 m)    General appearance: alert, cooperative and appears stated age Head: Normocephalic, without obvious abnormality, atraumatic Neck: no adenopathy, supple, symmetrical, trachea midline and thyroid normal to inspection and  palpation Lungs: clear to auscultation bilaterally Breasts: normal appearance, no masses or tenderness, No nipple retraction or dimpling, No nipple discharge or bleeding, No axillary or supraclavicular adenopathy Heart: regular rate and rhythm Abdomen: soft, non-tender; no masses,  no organomegaly Extremities: extremities normal, atraumatic, no cyanosis or edema Skin: Skin color, texture, turgor normal. No rashes or lesions Lymph nodes: Cervical, supraclavicular, and axillary nodes normal. No abnormal inguinal nodes palpated Neurologic: Grossly normal   Pelvic: External genitalia:  no lesions              Urethra:  normal appearing urethra with no masses, tenderness or lesions              Bartholin's and Skene's: normal                 Vagina: normal appearing vagina with normal color and discharge, no lesions              Cervix: multiparous appearance, no cervical motion tenderness, no lesions and IUD string noted in cervix appropriate length              Pap taken: Yes.   Bimanual Exam:  Uterus:  normal size, contour, position, consistency, mobility, non-tender and anteverted              Adnexa: normal adnexa, no mass, fullness, tenderness and limited palpation due to body habitus               Rectovaginal: Confirms               Anus:  normal sphincter tone, no lesions  Chaperone present: yes  A:  Well Woman with normal exam  Contraception Mirena IUD  Diabetes insulin dependent with endocrine management  Bipolar with MD management on Prozac  Morbid obesity on weight loss  Rubella status  P:   Reviewed health and wellness pertinent to exam  Discussed she would be called with appointment and insurance information for IUD removal.  Continue follow up with MD regarding other medical problems.  Stressed discussion and medication adjustment with Psychiatric medications if needed, before trying to conceive.  Lab: Rubella  Pap smear: yes   counseled on breast self exam, STD  prevention, HIV risk factors and prevention, feminine hygiene, adequate intake of calcium and vitamin D, diet and exercise. Start on prenatal vitamins and decrease smoking or stop.  return annually or prn  An After Visit Summary was printed and given to the patient.

## 2019-05-20 NOTE — Telephone Encounter (Signed)
-----   Message from Regina Eck, CNM sent at 05/20/2019  1:55 PM EDT ----- Patient desires IUD removal to plan for pregnancy. She is aware she will be called to schedule appointment. She plans to meet with her Psychiatrist first.

## 2019-05-20 NOTE — Telephone Encounter (Signed)
Order placed for IUD removal.   Routing to [employer] and [employer]  for [employer].   Encounter closed.

## 2019-05-20 NOTE — Patient Instructions (Addendum)
EXERCISE AND DIET:  We recommended that you start or continue a regular exercise program for good health. Regular exercise means any activity that makes your heart beat faster and makes you sweat.  We recommend exercising at least 30 minutes per day at least 3 days a week, preferably 4 or 5.  We also recommend a diet low in fat and sugar.  Inactivity, poor dietary choices and obesity can cause diabetes, heart attack, stroke, and kidney damage, among others.    ALCOHOL AND SMOKING:  Women should limit their alcohol intake to no more than 7 drinks/beers/glasses of wine (combined, not each!) per week. Moderation of alcohol intake to this level decreases your risk of breast cancer and liver damage. And of course, no recreational drugs are part of a healthy lifestyle.  And absolutely no smoking or even second hand smoke. Most people know smoking can cause heart and lung diseases, but did you know it also contributes to weakening of your bones? Aging of your skin?  Yellowing of your teeth and nails?  CALCIUM AND VITAMIN D:  Adequate intake of calcium and Vitamin D are recommended.  The recommendations for exact amounts of these supplements seem to change often, but generally speaking 600 mg of calcium (either carbonate or citrate) and 800 units of Vitamin D per day seems prudent. Certain women may benefit from higher intake of Vitamin D.  If you are among these women, your doctor will have told you during your visit.    PAP SMEARS:  Pap smears, to check for cervical cancer or precancers,  have traditionally been done yearly, although recent scientific advances have shown that most women can have pap smears less often.  However, every woman still should have a physical exam from her gynecologist every year. It will include a breast check, inspection of the vulva and vagina to check for abnormal growths or skin changes, a visual exam of the cervix, and then an exam to evaluate the size and shape of the uterus and  ovaries.  And after 37 years of age, a rectal exam is indicated to check for rectal cancers. We will also provide age appropriate advice regarding health maintenance, like when you should have certain vaccines, screening for sexually transmitted diseases, bone density testing, colonoscopy, mammograms, etc.   MAMMOGRAMS:  All women over 40 years old should have a yearly mammogram. Many facilities now offer a "3D" mammogram, which may cost around $50 extra out of pocket. If possible,  we recommend you accept the option to have the 3D mammogram performed.  It both reduces the number of women who will be called back for extra views which then turn out to be normal, and it is better than the routine mammogram at detecting truly abnormal areas.    COLONOSCOPY:  Colonoscopy to screen for colon cancer is recommended for all women at age 50.  We know, you hate the idea of the prep.  We agree, BUT, having colon cancer and not knowing it is worse!!  Colon cancer so often starts as a polyp that can be seen and removed at colonscopy, which can quite literally save your life!  And if your first colonoscopy is normal and you have no family history of colon cancer, most women don't have to have it again for 10 years.  Once every ten years, you can do something that may end up saving your life, right?  We will be happy to help you get it scheduled when you are ready.    Be sure to check your insurance coverage so you understand how much it will cost.  It may be covered as a preventative service at no cost, but you should check your particular policy.      Preparacin para TEFL teacher for Pregnancy Si est considerando quedar embarazada, programe una cita con su mdico de cabecera para saber cmo prepararse para un embarazo seguro y saludable (atencin previa a la concepcin). Durante una visita de atencin previa a la concepcin, su mdico:  Har un examen fsico completo, incluido una prueba de  Papanicolaou.  Har una historia clnica completa.  Le brindar informacin, responder sus preguntas y la ayudar a Building services engineer. Lista de verificacin previa a la concepcin Antecedentes mdicos  Infrmele a su mdico sobre cualquier afeccin actual o pasada. Las afecciones crnicas, como la diabetes, la hipertensin crnica y los problemas de tiroides, pueden Loss adjuster, chartered o la capacidad para Scientist, research (physical sciences).  Incluya sus antecedentes mdicos familiares y los de [address] [place] [address].  Informe al mdico si tiene antecedentes de ETS (enfermedades de transmisin sexual).Pueden afectar su embarazo. En algunos casos, pueden transmitirse al beb. Comente cualquier inquietud que [employer] ETS.  Consulte sobre los beneficios de las pruebas genticas, si se las indicaron. Estas pruebas mostrarn si existe alguna afeccin gentica que usted o su pareja puedan transmitir al beb.  Informe al mdico acerca de lo siguiente: ? Cualquier problema que haya tenido con la concepcin o el embarazo. ? Los medicamentos que toma. Estos incluyen vitaminas, suplementos a base de hierbas y [address] de [address]. ? Sus antecedentes de vacunacin. Hable sobre las vacunas que pueda necesitar. Dieta  Pregntele a su mdico qu puede incluir en una dieta sana que tenga una cantidad equilibrada de nutrientes. Esto es especialmente importante si est embarazada o preparndose para quedar embarazada.  Pdale al mdico que la ayude a Barista un peso saludable antes del [employer]. ? Si tiene sobrepeso, puede tener un mayor riesgo de tener ciertas complicaciones, como hipertensin arterial, diabetes y Sport and exercise psychologist. ? Si tiene [employer], existe una mayor probabilidad de [place] un beb con bajo peso al nacer. Estilo de vida, Aleen Campi y hogar  Informe al mdico: ? Sobre cualquier hbito en su estilo de vida, por ejemplo, consumir alcohol, drogas o fumar. ? Acerca de actividades recreativas que puedan  ponerla en riesgo durante el embarazo, como el esqu extremo y ciertos programas de ejercicio. ? Dgale al mdico si realiz viajes al exterior, en especial, a lugares con brotes del virus de [place]. ? Sobre las sustancias dainas a las que puede estar expuesta en el trabajo o en su casa. Estas incluyen productos qumicos, pesticidas, radiacin o incluso cajas de arena para gatos. ? Si no se siente segura en su casa. Salud mental  Informe al mdico acerca de lo siguiente: ? Antecedentes de trastornos de salud mental, que incluyen sentimientos de depresin, tristeza o ansiedad. ? Medicamentos que toma para alguna afeccin de salud mental. Estos incluyen hierbas y suplementos. Instrucciones para prepararse para Futures trader. Estilo de vida   Consuma una dieta equilibrada. Esto incluye frutas y verduras frescas, cereales integrales, carnes magras, productos lcteos descremados, grasas saludables y alimentos con alto contenido de fibras. Pida una cita con un nutricionista o especialista en diettica matriculado para obtener ayuda con la planificacin de comidas y las metas de Paediatric nurse.  Realice actividad fsica con regularidad. Intente estar Beverly Milch al menos al da, la [employer] de  la semana. Pregntele al mdico qu actividades son seguras Solicitor.  No consuma ningn producto que contenga nicotina o tabaco, como cigarrillos y Psychologist, sport and exercise. Si necesita ayuda para dejar de fumar, consulte al mdico.  No beba alcohol.  No consuma drogas.  Mantenga un peso saludable. Pregntele al mdico cul es el rango de peso adecuado para usted. Instrucciones generales  Lleve un registro preciso de [employer]. Para que el mdico pueda determinar la fecha probable de parto con ms facilidad.  Empiece a tomar vitaminas prenatales y suplementos con cido flico diariamente, segn las indicaciones del mdico.  Trate cualquier afeccin  crnica, como hipertensin arterial y diabetes, como le indique el mdico. Esto es importante. Cmo s que estoy embarazada? Puede estar embarazada si ha [employer] y no tuvo la [place]. Los sntomas de embarazo incipiente incluyen lo siguiente:  [place] leves.  Sangrado vaginal muy leve (manchado).  Cansancio inusual.  Nuseas y vmitos (nuseas matinales). Si tiene alguno de estos sntomas y sospecha que podra estar [place], puede hacerse una prueba de embarazo casera. Estas pruebas detectan la presencia de una hormona (gonadotropina corinica humana o [facility]) en la orina. El organismo de la mujer comienza a producir esta hormona al principio del Media planner. Estas pruebas son muy precisas. Espere por lo menos Management consultant de retraso de la [place]. Si la prueba indica que est embarazada (obtiene un resultado positivo), llame al mdico para concertar una cita para la atencin prenatal. Qu debo hacer si quedo embarazada?      Programe una cita con su mdico apenas sospeche que est embarazada.  No uses ningn producto que contenga nicotina, como cigarrillos, tabaco de Higher education careers adviser y Psychologist, sport and exercise. Si necesita ayuda para dejar de fumar, consulte al mdico.  No consuma bebidas alcohlicas. El alcohol se relaciona con ciertos defectos congnitos.  Evite los olores y las sustancias qumicas txicas.  Puede seguir teniendo [employer] si no le causan dolor u otros problemas, por ejemplo, sangrado vaginal. Esta informacin no tiene Marine scientist el consejo del mdico. Asegrese de hacerle al mdico cualquier pregunta que tenga. Document Released: 07/16/2013 Document Revised: 10/21/2017 Document Reviewed: 01/31/2016 Elsevier Patient Education  2020 [employer].

## 2019-05-21 LAB — RUBELLA SCREEN: Rubella Antibodies, IGG: 4.69 index (ref >0.99)

## 2019-05-22 ENCOUNTER — Telehealth: Payer: Self-pay | Admitting: Certified Nurse Midwife

## 2019-05-22 LAB — CYTOLOGY - PAP: Diagnosis: NEGATIVE

## 2019-05-22 NOTE — Telephone Encounter (Signed)
Call placed to convey benefits for iud removal. Spoke with patient she understands/agreeable with the benefits. Appointment scheduled 05/27/19 with Melvia Heaps.

## 2019-05-27 ENCOUNTER — Ambulatory Visit (INDEPENDENT_AMBULATORY_CARE_PROVIDER_SITE_OTHER): Payer: BC Managed Care – PPO | Admitting: Certified Nurse Midwife

## 2019-05-27 ENCOUNTER — Other Ambulatory Visit: Payer: Self-pay

## 2019-05-27 ENCOUNTER — Encounter: Payer: Self-pay | Admitting: Certified Nurse Midwife

## 2019-05-27 DIAGNOSIS — Z30432 Encounter for removal of intrauterine contraceptive device: Secondary | ICD-10-CM

## 2019-05-27 NOTE — Progress Notes (Signed)
  Review of Systems  Constitutional: Negative.   HENT: Negative.   Eyes: Negative.   Respiratory: Negative.   Cardiovascular: Negative.   Gastrointestinal: Negative.   Genitourinary: Negative.   Musculoskeletal: Negative.   Skin: Negative.   Neurological: Negative.   Endo/Heme/Allergies: Negative.   Psychiatric/Behavioral: Negative.     65 yrs Hispanic Divorced [id number] LMP 04/29/2019.      Presents for Mirena removal.  Denies any vaginal symptoms or STD concerns.  Plans for contraception are condoms/spermacide and plans pregnancy after she has her medications adjusted for use in pregnancy. She plans to confirm here once misses period and then use [facility].  LMP 04/29/2019          HPI neg.  Exam: WDWN overweight female Orientation x 3 affect normal Abdomen: soft non-tender Groin:no inguinal nodes palpated    Pelvic exam:Pelvic exam: VULVA: normal appearing vulva with no masses, tenderness or lesions, VAGINA: normal appearing vagina with normal color and discharge, no lesions, CERVIX: normal appearing cervix without discharge or lesions, UTERUS: uterus is normal size, shape, consistency and nontender, ADNEXA: normal adnexa in size, nontender and no masses.  Procedure: Speculum placed, cervix visualized.  IUD string visualized, grasp with ring forceps, with gentle traction IUD removed intact.  IUD shown to patient and discarded. Speculum removed.   Assessment:Mirena IUD removal intact Pt tolerated procedure well.  Plan: Begin contraceptive choice ofcondoms Start on prenatal vitamins daily. Meet with her psychiatrist regarding medication change. Come in for confirmation of pregnancy once she has +UPT.    Questions addressed  Return Visit: prn, as above

## 2019-05-27 NOTE — Patient Instructions (Signed)
Preparing for Pregnancy If you are considering becoming pregnant, make an appointment to see your regular health care provider to learn how to prepare for a safe and healthy pregnancy (preconception care). During a preconception care visit, your health care provider will:  Do a complete physical exam, including a Pap test.  Take a complete medical history.  Give you information, answer your questions, and help you resolve problems. Preconception checklist Medical history  Tell your health care provider about any current or past medical conditions. Your pregnancy or your ability to become pregnant may be affected by chronic conditions, such as diabetes, chronic hypertension, and thyroid problems.  Include your family's medical history as well as your partner's medical history.  Tell your health care provider about any history of STIs (sexually transmitted infections).These can affect your pregnancy. In some cases, they can be passed to your baby. Discuss any concerns that you have about STIs.  If indicated, discuss the benefits of genetic testing. This testing will show whether there are any genetic conditions that may be passed from you or your partner to your baby.  Tell your health care provider about: ? Any problems you have had with conception or pregnancy. ? Any medicines you take. These include vitamins, herbal supplements, and over-the-counter medicines. ? Your history of immunizations. Discuss any vaccinations that you may need. Diet  Ask your health care provider what to include in a healthy diet that has a balance of nutrients. This is especially important when you are pregnant or preparing to become pregnant.  Ask your health care provider to help you reach a healthy weight before pregnancy. ? If you are overweight, you may be at higher risk for certain complications, such as high blood pressure, diabetes, and preterm birth. ? If you are underweight, you are more likely to  have a baby who has a low birth weight. Lifestyle, work, and home  Let your health care provider know: ? About any lifestyle habits that you have, such as alcohol use, drug use, or smoking. ? About recreational activities that may put you at risk during pregnancy, such as downhill skiing and certain exercise programs. ? Tell your health care provider about any international travel, especially any travel to places with an active Zika virus outbreak. ? About harmful substances that you may be exposed to at work or at home. These include chemicals, pesticides, radiation, or even litter boxes. ? If you do not feel safe at home. Mental health  Tell your health care provider about: ? Any history of mental health conditions, including feelings of depression, sadness, or anxiety. ? Any medicines that you take for a mental health condition. These include herbs and supplements. Home instructions to prepare for pregnancy Lifestyle   Eat a balanced diet. This includes fresh fruits and vegetables, whole grains, lean meats, low-fat dairy products, healthy fats, and foods that are high in fiber. Ask to meet with a nutritionist or registered dietitian for assistance with meal planning and goals.  Get regular exercise. Try to be active for at least 30 minutes a day on most days of the week. Ask your health care provider which activities are safe during pregnancy.  Do not use any products that contain nicotine or tobacco, such as cigarettes and e-cigarettes. If you need help quitting, ask your health care provider.  Do not drink alcohol.  Do not take illegal drugs.  Maintain a healthy weight. Ask your health care provider what weight range is right for you. General   instructions  Keep an accurate record of your menstrual periods. This makes it easier for your health care provider to determine your baby's due date.  Begin taking prenatal vitamins and folic acid supplements daily as directed by your  health care provider.  Manage any chronic conditions, such as high blood pressure and diabetes, as told by your health care provider. This is important. How do I know that I am pregnant? You may be pregnant if you have been sexually active and you miss your period. Symptoms of early pregnancy include:  Mild cramping.  Very light vaginal bleeding (spotting).  Feeling unusually tired.  Nausea and vomiting (morning sickness). If you have any of these symptoms and you suspect that you might be pregnant, you can take a home pregnancy test. These tests check for a hormone in your urine (human chorionic gonadotropin, or hCG). A woman's body begins to make this hormone during early pregnancy. These tests are very accurate. Wait until at least the first day after you miss your period to take one. If the test shows that you are pregnant (you get a positive result), call your health care provider to make an appointment for prenatal care. What should I do if I become pregnant?      Make an appointment with your health care provider as soon as you suspect you are pregnant.  Do not use any products that contain nicotine, such as cigarettes, chewing tobacco, and e-cigarettes. If you need help quitting, ask your health care provider.  Do not drink alcoholic beverages. Alcohol is related to a number of birth defects.  Avoid toxic odors and chemicals.  You may continue to have sexual intercourse if it does not cause pain or other problems, such as vaginal bleeding. This information is not intended to replace advice given to you by your health care provider. Make sure you discuss any questions you have with your health care provider. Document Released: 06/23/2008 Document Revised: 07/13/2017 Document Reviewed: 01/31/2016 Elsevier Patient Education  2020 Elsevier Inc.        

## 2019-10-11 ENCOUNTER — Encounter: Payer: Self-pay | Admitting: Certified Nurse Midwife

## 2019-12-30 ENCOUNTER — Telehealth: Payer: Self-pay | Admitting: Obstetrics and Gynecology

## 2019-12-30 NOTE — Telephone Encounter (Signed)
Patient is having pain in her left breast. °

## 2019-12-30 NOTE — Telephone Encounter (Signed)
Spoke with patient. Patient reports intermittent, sharp, burning pain on the outer side of her left breast. Symptoms started approximately 2 years ago. Patient reports her breast are "lumpy" so she is unsure what she is feeling. Denies redness, swelling, itching, nipple d/c, fever/chills. No previous MMG. Patient is requesting an OV.   OV scheduled for 01/02/20 at 1:30pm with Dr. Oscar La, patient declines earlier appt offered. Covid 19 precautions reviewed.   Last AEX 05/20/19 w/ Leota Sauers, CNM Next AEX 05/21/20  Routing to provider for final review. Patient is agreeable to disposition. Will close encounter.

## 2020-01-01 ENCOUNTER — Other Ambulatory Visit: Payer: Self-pay

## 2020-01-02 ENCOUNTER — Encounter: Payer: Self-pay | Admitting: Obstetrics and Gynecology

## 2020-01-02 ENCOUNTER — Other Ambulatory Visit: Payer: Self-pay

## 2020-01-02 ENCOUNTER — Telehealth: Payer: Self-pay

## 2020-01-02 ENCOUNTER — Ambulatory Visit: Payer: BLUE CROSS/BLUE SHIELD | Admitting: Obstetrics and Gynecology

## 2020-01-02 ENCOUNTER — Other Ambulatory Visit: Payer: Self-pay | Admitting: Obstetrics and Gynecology

## 2020-01-02 VITALS — BP 122/74 | HR 87 | Temp 98.3°F | Ht 65.25 in | Wt 344.0 lb

## 2020-01-02 DIAGNOSIS — N644 Mastodynia: Secondary | ICD-10-CM

## 2020-01-02 DIAGNOSIS — N915 Oligomenorrhea, unspecified: Secondary | ICD-10-CM | POA: Diagnosis not present

## 2020-01-02 DIAGNOSIS — N6312 Unspecified lump in the right breast, upper inner quadrant: Secondary | ICD-10-CM

## 2020-01-02 DIAGNOSIS — N63 Unspecified lump in unspecified breast: Secondary | ICD-10-CM | POA: Diagnosis not present

## 2020-01-02 MED ORDER — MEDROXYPROGESTERONE ACETATE 5 MG PO TABS: ORAL_TABLET | ORAL | 0 refills | Status: DC

## 2020-01-02 NOTE — Progress Notes (Signed)
GYNECOLOGY  VISIT   HPI: 39 y.o.   Divorced White or Caucasian Hispanic or Latino  female   [phone] with Patient's last menstrual period was 12/24/2019.   here for Cramping and burning in right breast.    She has had intermittent pain in either breast for the last year, typically in the upper outer breast. Never has pain in both breasts at the same time. Her current pain started 2 days, intermittent, cramping/burning. The pain is moderate, its a sudden pain, doesn't last more than a few seconds. She hasn't noticed any specific lumps, feel overall lumpy. Cycles have been every 3 months x 5 days, heavy. Trying to get pregnant. She has always had irregular cycles. No galactorrhea.  GYNECOLOGIC HISTORY: Patient's last menstrual period was 12/24/2019. Contraception:none Menopausal hormone therapy: none        OB History    Gravida  4   Para  2   Term  2   Preterm      AB  2   Living  2     SAB  1   TAB      Ectopic  1   Multiple      Live Births  2              Patient Active Problem List   Diagnosis Date Noted  . 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 Throat irritation 10/20/2017  . Mixed dyslipidemia 11/01/2016  . Severe obesity (BMI >= 40) (HCC) 11/01/2016  . Smoking 11/01/2016  . Vitamin D deficiency 11/01/2016  . Anxiety state 08/16/2013  . Fetal macrosomia in pregnancy in third trimester, antepartum 07/11/2013  . Chronic hypertension complicating or reason for care during pregnancy 06/24/2013  . Hypersomnia 06/24/2013  . Diabetes mellitus, antepartum(648.03) 02/05/2013  . Obesity complicating peripregnancy, antepartum 02/05/2013  . Pregnancy 01/29/2013  . Esophageal reflux 01/04/2013  . Hypertension, essential 01/01/2013  . Type 2 diabetes mellitus with hyperglycemia, with long-term current use of insulin (HCC) 01/01/2013  . Pregnancy with history of ectopic pregnancy in first trimester 01/01/2013  . Depressive disorder 10/03/2012  . Venous (peripheral)  insufficiency 10/03/2012    Past Medical History:  Diagnosis Date  . Abnormal Pap smear 2005   ASCUS +HPV  . Anxiety   . Anxiety and depression   . Depression   . Diabetes mellitus without complication (HCC)   . Genital warts   . Hypertension   . Manic bipolar I disorder (HCC)   . Syphilis in female     Past Surgical History:  Procedure Laterality Date  . CHOLECYSTECTOMY  6/10   with liver infection, also occluded bile duct  . INTRAUTERINE DEVICE INSERTION     11-24-15 mirena iud inserted    Current Outpatient Medications  Medication Sig Dispense Refill  . ACCU-CHEK FASTCLIX LANCETS MISC 1 Units by Percutaneous route 4 (four) times daily. 100 each 12  . atorvastatin (LIPITOR) 10 MG tablet Take 10 mg by mouth daily.    Marland Kitchen azelastine (OPTIVAR) 0.05 % ophthalmic solution Use as directed as needed for allergies    . canagliflozin (INVOKANA) 300 MG TABS tablet Take by mouth.    . Dapagliflozin-metFORMIN HCl ER (XIGDUO XR) 11-998 MG TB24 Take by mouth.    Marland Kitchen FLUoxetine (PROZAC) 40 MG capsule     . glucose blood (BAYER CONTOUR TEST) test strip Check blood sugar level 4 times daily AM and 2 hours PP 100 each 12  . Insulin Degludec (TRESIBA FLEXTOUCH [place]) Inject into the skin. 60  units    . pioglitazone (ACTOS) 30 MG tablet Take 1 tablet once a day for diabetes    . RABEprazole (ACIPHEX) 20 MG tablet Take by mouth.    . risperiDONE (RISPERDAL) 1 MG tablet     . VITAMIN D PO Take 5,000 Int'l Units by mouth.    . TRULICITY 4.5 EP/3.2RJ SOPN      No current facility-administered medications for this visit.     ALLERGIES: Neosporin [neomycin-polymyxin-gramicidin]  Family History  Problem Relation Age of Onset  . Cancer Father        lung  . Hypertension Father   . Stroke Father   . Heart disease Father   . Hypertension Mother   . Diabetes Paternal Grandmother   . Cancer Paternal Grandfather        prostate    Social History   Socioeconomic History  . Marital status:  Divorced    Spouse name: Not on file  . Number of children: Not on file  . Years of education: Not on file  . Highest education level: Not on file  Occupational History  . Not on file  Tobacco Use  . Smoking status: Current Every Day Smoker    Types: Cigarettes    Last attempt to quit: 12/02/2012    Years since quitting: 7.0  . Smokeless tobacco: Never Used  . Tobacco comment: less than 1/2 a pack a day  Substance and Sexual Activity  . Alcohol use: Not Currently    Alcohol/week: 0.0 standard drinks  . Drug use: No  . Sexual activity: Yes    Partners: Male    Birth control/protection: I.U.D.  Other Topics Concern  . Not on file  Social History Narrative  . Not on file   Social Determinants of Health   Financial Resource Strain:   . Difficulty of Paying Living Expenses:   Food Insecurity:   . Worried About Charity fundraiser in the Last Year:   . Arboriculturist in the Last Year:   Transportation Needs:   . Film/video editor (Medical):   Marland Kitchen Lack of Transportation (Non-Medical):   Physical Activity:   . Days of Exercise per Week:   . Minutes of Exercise per Session:   Stress:   . Feeling of Stress :   Social Connections:   . Frequency of Communication with Friends and Family:   . Frequency of Social Gatherings with Friends and Family:   . Attends Religious Services:   . Active Member of Clubs or Organizations:   . Attends Archivist Meetings:   Marland Kitchen Marital Status:   Intimate Partner Violence:   . Fear of Current or Ex-Partner:   . Emotionally Abused:   Marland Kitchen Physically Abused:   . Sexually Abused:     Review of Systems  All other systems reviewed and are negative.   PHYSICAL EXAMINATION:    BP 122/74   Pulse 87   Temp 98.3 F (36.8 C)   Ht 5' 5.25" (1.657 m)   Wt (!) 344 lb (156 kg)   LMP 12/24/2019   SpO2 99%   BMI 56.81 kg/m     General appearance: alert, cooperative and appears stated age Breasts: in the right breast at 2 o'clock ~1 cm  from the areolar region is a 1.5 cm smooth, mobile, tender lump. In the upper outerquadrant of the left breast is an area of increased nodularity that is tender at 10-11 o'clock several cm from the areolar  region. No skin changes.  Abdomen: soft, non-tender; non distended, no masses,  no organomegaly   Reviewed labs, recent HgbA1C was 10.2  ASSESSMENT Breast pain Right breast lump, increased nodularity in the upper outer left breast Longterm oligomenorrhea, normal TSH/prolactin in 2016 BMI 56 Attempting pregnancy Uncontrolled DM, recent HgbA1C was 10.2. Discussed with the patient that it is vital that she gets her diabetes under control prior to attempting pregnancy     PLAN UPT negative Set up diagnostic breast imaging Use condoms, declines micronor Will use cyclic provera every 2 months Consider other contraception until her DM is under control Recommend consultation with MFM, she declines at this time Discussed importance of endometrial protection Reviewed medication. Lipitor is contraindicated, invokava is not be be used in the 2nd and 3rd trimesters, limited information on trulicity and actos, not first line medications in pregnancy Advised her to discuss her desire for pregnancy and medication management with her Endocrinologist.     35 minutes was spent in total patient care.   CC: Dr Leavy Cella and Endocrinologist.

## 2020-01-02 NOTE — Telephone Encounter (Signed)
-----   Message from Romualdo Bolk, MD sent at 01/02/2020  2:25 PM EDT ----- She needs bilateral diagnostic breast imaging

## 2020-01-02 NOTE — Telephone Encounter (Signed)
Called and made appt at [facility].  Pt has scheduled appt on 01/17/20 at 2:10 pm.  Call placed to pt. Spoke with pt. Pt agreeable and verbalized understanding of appt.  Orders placed by TBC for co-sign.   Routing to Dr Oscar La for review.  Encounter closed.

## 2020-01-02 NOTE — Patient Instructions (Signed)

## 2020-01-03 LAB — TSH: TSH: 1.65 u[IU]/mL (ref 0.450–4.500)

## 2020-01-03 LAB — PROLACTIN: Prolactin: 11.5 ng/mL (ref 4.8–23.3)

## 2020-01-17 ENCOUNTER — Ambulatory Visit
Admission: RE | Admit: 2020-01-17 | Discharge: 2020-01-17 | Disposition: A | Discharge status: Home / Self Care | Payer: BLUE CROSS/BLUE SHIELD | Source: Ambulatory Visit | Attending: Obstetrics and Gynecology | Admitting: Obstetrics and Gynecology

## 2020-01-17 ENCOUNTER — Other Ambulatory Visit: Payer: Self-pay

## 2020-01-17 ENCOUNTER — Other Ambulatory Visit: Payer: Self-pay | Admitting: Obstetrics and Gynecology

## 2020-01-17 ENCOUNTER — Other Ambulatory Visit: Payer: BC Managed Care – PPO

## 2020-01-17 DIAGNOSIS — N644 Mastodynia: Secondary | ICD-10-CM

## 2020-01-17 DIAGNOSIS — N6310 Unspecified lump in the right breast, unspecified quadrant: Secondary | ICD-10-CM

## 2020-04-29 ENCOUNTER — Other Ambulatory Visit: Payer: Self-pay

## 2020-04-29 ENCOUNTER — Ambulatory Visit: Payer: BLUE CROSS/BLUE SHIELD | Admitting: Obstetrics and Gynecology

## 2020-04-29 ENCOUNTER — Encounter: Payer: Self-pay | Admitting: Obstetrics and Gynecology

## 2020-04-29 VITALS — BP 118/82 | HR 85 | Ht 66.0 in | Wt 348.0 lb

## 2020-04-29 DIAGNOSIS — N644 Mastodynia: Secondary | ICD-10-CM

## 2020-04-29 NOTE — Patient Instructions (Signed)

## 2020-04-29 NOTE — Progress Notes (Signed)
GYNECOLOGY  VISIT   HPI: 39 y.o.   Divorced White or Caucasian Hispanic or Latino  female   [phone] with Patient's last menstrual period was 04/16/2020.   here for 3 month breast check. She was seen in 6/21 c/o mastalgia. Breast exam at that time:    in the right breast at 2 o'clock ~1 cm from the areolar region is a 1.5 cm smooth, mobile, tender lump. In the upper outerquadrant of the left breast is an area of increased nodularity that is tender at 10-11 o'clock several cm from the areolar region. No skin changes.  Breast imaging from 01/17/20: IMPRESSION: 1. 12 mm probably benign mass in the upper outer right breast no sonographic correlate. 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 2. No evidence of malignancy elsewhere in either breast.  RECOMMENDATION: Follow-up right 3D diagnostic mammogram and possible ultrasound in 6 months.   She continues to have intermittent bilateral breast pain. She does drink a large amount of caffeine. She drinks  46 oz of coffee up to 3 x a day. When she goes to [employer] she will get 7-8 espresso shots in her coffee.   GYNECOLOGIC HISTORY: Patient's last menstrual period was 04/16/2020. Contraception: none  Menopausal hormone therapy: none         OB History    Gravida  4   Para  2   Term  2   Preterm      AB  2   Living  2     SAB  1   TAB      Ectopic  1   Multiple      Live Births  2              Patient Active Problem List   Diagnosis Date Noted  . 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 Throat irritation 10/20/2017  . Mixed dyslipidemia 11/01/2016  . Severe obesity (BMI >= 40) (HCC) 11/01/2016  . Smoking 11/01/2016  . Vitamin D deficiency 11/01/2016  . Anxiety state 08/16/2013  . Fetal macrosomia in pregnancy in third trimester, antepartum 07/11/2013  . Chronic hypertension complicating or reason for care during pregnancy 06/24/2013  . Hypersomnia 06/24/2013  . Diabetes mellitus, antepartum(648.03) 02/05/2013  .  Obesity complicating peripregnancy, antepartum 02/05/2013  . Pregnancy 01/29/2013  . Esophageal reflux 01/04/2013  . Hypertension, essential 01/01/2013  . Type 2 diabetes mellitus with hyperglycemia, with long-term current use of insulin (HCC) 01/01/2013  . Pregnancy with history of ectopic pregnancy in first trimester 01/01/2013  . Depressive disorder 10/03/2012  . Venous (peripheral) insufficiency 10/03/2012    Past Medical History:  Diagnosis Date  . Abnormal Pap smear 2005   ASCUS +HPV  . Anxiety   . Anxiety and depression   . Depression   . Diabetes mellitus without complication (HCC)   . Genital warts   . Hypertension   . Manic bipolar I disorder (HCC)   . Syphilis in female     Past Surgical History:  Procedure Laterality Date  . CHOLECYSTECTOMY  6/10   with liver infection, also occluded bile duct  . INTRAUTERINE DEVICE INSERTION     11-24-15 mirena iud inserted    Current Outpatient Medications  Medication Sig Dispense Refill  . ACCU-CHEK FASTCLIX LANCETS MISC 1 Units by Percutaneous route 4 (four) times daily. 100 each 12  . azelastine (OPTIVAR) 0.05 % ophthalmic solution Use as directed as needed for allergies    . canagliflozin (INVOKANA) 300 MG TABS tablet Take by mouth.    Marland Kitchen  Dapagliflozin-metFORMIN HCl ER (XIGDUO XR) 11-998 MG TB24 Take by mouth.    . dicyclomine (BENTYL) 10 MG capsule Take by mouth.    Marland Kitchen FLUoxetine (PROZAC) 40 MG capsule     . glucose blood (BAYER CONTOUR TEST) test strip Check blood sugar level 4 times daily AM and 2 hours PP 100 each 12  . Insulin Degludec (TRESIBA FLEXTOUCH [place]) Inject into the skin. 60 units    . pioglitazone (ACTOS) 30 MG tablet Take 1 tablet once a day for diabetes    . RABEprazole (ACIPHEX) 20 MG tablet Take by mouth.    . risperiDONE (RISPERDAL) 1 MG tablet     . TRULICITY 4.5 MG/0.5ML SOPN     . VITAMIN D PO Take 5,000 Int'l Units by mouth.    . Cholecalciferol 125 MCG (5000 UT) TABS Take by mouth.     No current  facility-administered medications for this visit.     ALLERGIES: Neosporin [neomycin-polymyxin-gramicidin]  Family History  Problem Relation Age of Onset  . Cancer Father        lung  . Hypertension Father   . Stroke Father   . Heart disease Father   . Hypertension Mother   . Diabetes Paternal Grandmother   . Cancer Paternal Grandfather        prostate    Social History   Socioeconomic History  . Marital status: Divorced    Spouse name: Not on file  . Number of children: Not on file  . Years of education: Not on file  . Highest education level: Not on file  Occupational History  . Not on file  Tobacco Use  . Smoking status: Current Every Day Smoker    Types: Cigarettes    Last attempt to quit: 12/02/2012    Years since quitting: 7.4  . Smokeless tobacco: Never Used  . Tobacco comment: less than 1/2 a pack a day  Substance and Sexual Activity  . Alcohol use: Not Currently    Alcohol/week: 0.0 standard drinks  . Drug use: No  . Sexual activity: Yes    Partners: Male    Birth control/protection: I.U.D.  Other Topics Concern  . Not on file  Social History Narrative  . Not on file   Social Determinants of Health   Financial Resource Strain:   . Difficulty of Paying Living Expenses: Not on file  Food Insecurity:   . Worried About Programme researcher, broadcasting/film/video in the Last Year: Not on file  . Ran Out of Food in the Last Year: Not on file  Transportation Needs:   . Lack of Transportation (Medical): Not on file  . Lack of Transportation (Non-Medical): Not on file  Physical Activity:   . Days of Exercise per Week: Not on file  . Minutes of Exercise per Session: Not on file  Stress:   . Feeling of Stress : Not on file  Social Connections:   . Frequency of Communication with Friends and Family: Not on file  . Frequency of Social Gatherings with Friends and Family: Not on file  . Attends Religious Services: Not on file  . Active Member of Clubs or Organizations: Not on file   . Attends Banker Meetings: Not on file  . Marital Status: Not on file  Intimate Partner Violence:   . Fear of Current or Ex-Partner: Not on file  . Emotionally Abused: Not on file  . Physically Abused: Not on file  . Sexually Abused: Not on file  Review of Systems  All other systems reviewed and are negative.   PHYSICAL EXAMINATION:    BP 118/82   Pulse 85   Ht 5\' 6"  (1.676 m)   Wt (!) 348 lb (157.9 kg)   LMP 04/16/2020   SpO2 100%   BMI 56.17 kg/m     General appearance: alert, cooperative and appears stated age Breasts: normal appearance, no masses or tenderness, bilateral fibrocystic changes, no specific lumps noted today.   ASSESSMENT Breast pain, drinks a very large amount of caffeine daily.  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  No specific lumps noted today, bilateral fibrocystic changes    PLAN Recommended that she cut back on her caffeine She has f/u breast imaging scheduled in 12/21    After the visit she c/o a one year h/o perianal itching. Has an annual exam in a few weeks and was planning on discussing it then. Handout on vulvar skin care given, can try Vaseline.

## 2020-05-18 NOTE — Progress Notes (Deleted)
39 y.o. [id number] Divorced White or Caucasian Hispanic or Latino female here for annual exam.      No LMP recorded.          Sexually active: {yes no:314532}  The current method of family planning is {contraception:315051}.    Exercising: {yes no:314532}  {types:19826} Smoker:  {YES NO:22349}  Health Maintenance: Pap: 05/20/19 WNL  04-22-16 neg HPV HR neg History of abnormal Pap:  yes MMG:  [place]  density B oval mass upper out right breast  scattered areas of fibroglandular density. BMD:   None  Colonoscopy: none  TDaP:  2015 Gardasil: ***   reports that she has been smoking cigarettes. She has never used smokeless tobacco. She reports previous alcohol use. She reports that she does not use drugs.  Past Medical History:  Diagnosis Date  . Abnormal Pap smear 2005   ASCUS +HPV  . Anxiety   . Anxiety and depression   . Depression   . Diabetes mellitus without complication (HCC)   . Genital warts   . Hypertension   . Manic bipolar I disorder (HCC)   . Syphilis in female     Past Surgical History:  Procedure Laterality Date  . CHOLECYSTECTOMY  6/10   with liver infection, also occluded bile duct  . INTRAUTERINE DEVICE INSERTION     11-24-15 mirena iud inserted    Current Outpatient Medications  Medication Sig Dispense Refill  . ACCU-CHEK FASTCLIX LANCETS MISC 1 Units by Percutaneous route 4 (four) times daily. 100 each 12  . azelastine (OPTIVAR) 0.05 % ophthalmic solution Use as directed as needed for allergies    . canagliflozin (INVOKANA) 300 MG TABS tablet Take by mouth.    . Cholecalciferol 125 MCG (5000 UT) TABS Take by mouth.    . Dapagliflozin-metFORMIN HCl ER (XIGDUO XR) 11-998 MG TB24 Take by mouth.    Marland Kitchen FLUoxetine (PROZAC) 40 MG capsule     . glucose blood (BAYER CONTOUR TEST) test strip Check blood sugar level 4 times daily AM and 2 hours PP 100 each 12  . Insulin Degludec (TRESIBA FLEXTOUCH [place]) Inject into the skin. 60 units    . pioglitazone (ACTOS) 30 MG tablet  Take 1 tablet once a day for diabetes    . RABEprazole (ACIPHEX) 20 MG tablet Take by mouth.    . risperiDONE (RISPERDAL) 1 MG tablet     . TRULICITY 4.5 MG/0.5ML SOPN     . VITAMIN D PO Take 5,000 Int'l Units by mouth.     No current facility-administered medications for this visit.    Family History  Problem Relation Age of Onset  . Cancer Father        lung  . Hypertension Father   . Stroke Father   . Heart disease Father   . Hypertension Mother   . Diabetes Paternal Grandmother   . Cancer Paternal Grandfather        prostate    Review of Systems  Exam:   There were no vitals taken for this visit.  Weight change: @WEIGHTCHANGE @ Height:      Ht Readings from Last 3 Encounters:  04/29/20 5\' 6"  (1.676 m)  01/02/20 5' 5.25" (1.657 m)  05/20/19 5' 5.25" (1.657 m)    General appearance: alert, cooperative and appears stated age Head: Normocephalic, without obvious abnormality, atraumatic Neck: no adenopathy, supple, symmetrical, trachea midline and thyroid {CHL AMB PHY EX THYROID NORM DEFAULT:[phone]::"normal to inspection and palpation"} Lungs: clear to auscultation bilaterally Cardiovascular: regular rate  and rhythm Breasts: {Exam; breast:13139::"normal appearance, no masses or tenderness"} Abdomen: soft, non-tender; non distended,  no masses,  no organomegaly Extremities: extremities normal, atraumatic, no cyanosis or edema Skin: Skin color, texture, turgor normal. No rashes or lesions Lymph nodes: Cervical, supraclavicular, and axillary nodes normal. No abnormal inguinal nodes palpated Neurologic: Grossly normal   Pelvic: External genitalia:  no lesions              Urethra:  normal appearing urethra with no masses, tenderness or lesions              Bartholins and Skenes: normal                 Vagina: normal appearing vagina with normal color and discharge, no lesions              Cervix: {CHL AMB PHY EX CERVIX NORM [phone]::"no lesions"}                Bimanual Exam:  Uterus:  {CHL AMB PHY EX UTERUS NORM DEFAULT:[phone]::"normal size, contour, position, consistency, mobility, non-tender"}              Adnexa: {CHL AMB PHY EX ADNEXA NO MASS [phone]::"no mass, fullness, tenderness"}               Rectovaginal: Confirms               Anus:  normal sphincter tone, no lesions  *** chaperoned for the exam.  A:  Well Woman with normal exam  P:

## 2020-05-21 ENCOUNTER — Ambulatory Visit: Payer: BLUE CROSS/BLUE SHIELD | Admitting: Obstetrics and Gynecology

## 2020-07-22 ENCOUNTER — Other Ambulatory Visit: Payer: Self-pay

## 2020-07-22 ENCOUNTER — Ambulatory Visit
Admission: RE | Admit: 2020-07-22 | Discharge: 2020-07-22 | Disposition: A | Discharge status: Home / Self Care | Payer: BLUE CROSS/BLUE SHIELD | Source: Ambulatory Visit | Attending: Obstetrics and Gynecology | Admitting: Obstetrics and Gynecology

## 2020-07-22 ENCOUNTER — Other Ambulatory Visit: Payer: Self-pay | Admitting: Obstetrics and Gynecology

## 2020-07-22 ENCOUNTER — Ambulatory Visit: Payer: BLUE CROSS/BLUE SHIELD

## 2020-07-22 DIAGNOSIS — N6310 Unspecified lump in the right breast, unspecified quadrant: Secondary | ICD-10-CM

## 2020-09-17 ENCOUNTER — Encounter: Payer: Self-pay | Admitting: Obstetrics and Gynecology

## 2020-09-17 ENCOUNTER — Ambulatory Visit: Payer: BLUE CROSS/BLUE SHIELD | Admitting: Obstetrics and Gynecology

## 2020-09-17 ENCOUNTER — Other Ambulatory Visit: Payer: Self-pay

## 2020-09-17 VITALS — BP 122/68 | HR 78 | Ht 65.5 in | Wt 342.8 lb

## 2020-09-17 DIAGNOSIS — E1165 Type 2 diabetes mellitus with hyperglycemia: Secondary | ICD-10-CM

## 2020-09-17 DIAGNOSIS — N763 Subacute and chronic vulvitis: Secondary | ICD-10-CM

## 2020-09-17 DIAGNOSIS — Z794 Long term (current) use of insulin: Secondary | ICD-10-CM

## 2020-09-17 LAB — WET PREP FOR TRICH, YEAST, CLUE

## 2020-09-17 MED ORDER — HYDROXYZINE HCL 10 MG PO TABS: 10.0000 mg | ORAL_TABLET | Freq: Three times a day (TID) | ORAL | 0 refills | Status: DC | PRN

## 2020-09-17 MED ORDER — CLOBETASOL PROPIONATE 0.05 % EX OINT: 1.0000 "application " | TOPICAL_OINTMENT | Freq: Two times a day (BID) | CUTANEOUS | 0 refills | Status: DC

## 2020-09-17 NOTE — Patient Instructions (Signed)
Vaginitis  Vaginitis is a condition in which the vaginal tissue swells and becomes irritated. This condition is most often caused by a change in the normal balance of bacteria and yeast that live in the vagina. This change causes an overgrowth of certain bacteria or yeast, which causes the inflammation. There are different types of vaginitis. What are the causes? The cause of this condition depends on the type of vaginitis. It can be caused by:  Bacteria (bacterial vaginosis).  Yeast, which is a fungus (candidiasis).  A parasite (trichomoniasis vaginitis).  A virus (viral vaginitis).  Low hormone levels (atrophic vaginitis). Low hormone levels can occur during pregnancy, breastfeeding, or after menopause.  Irritants, such as bubble baths, scented tampons, and feminine sprays (allergic vaginitis). Other factors can change the normal balance of the yeast and bacteria that live in the vagina. These include:  Antibiotic medicines.  Poor hygiene.  Diaphragms, vaginal sponges, spermicides, birth control pills, and intrauterine devices (IUDs).  Sex.  Infection.  Uncontrolled diabetes.  A weakened body defense system (immune system). What increases the risk? This condition is more likely to develop in women who:  Smoke or are exposed to secondhand smoke.  Use vaginal douches, scented tampons, or scented sanitary pads.  Wear tight-fitting pants or thong underwear.  Use oral birth control pills or an IUD.  Have sex without a condom or have multiple partners.  Have an STI.  Frequently use the spermicide nonoxynol-9.  Eat lots of foods high in sugar or who have uncontrolled diabetes.  Have low estrogen levels.  Have a weakened immune system from an immune disorder or medical treatment.  Are pregnant or breastfeeding. What are the signs or symptoms? Symptoms vary depending on the cause of the vaginitis. Common symptoms include:  Abnormal vaginal discharge. ? The  discharge is white, gray, or yellow with bacterial vaginosis. ? The discharge is thick, white, and cheesy with a yeast infection. ? The discharge is frothy and yellow or greenish with trichomoniasis.  A bad vaginal smell. The smell is fishy with bacterial vaginosis.  Vaginal itching, pain, or swelling.  Pain with sex.  Pain or burning when urinating. Sometimes there are no symptoms. How is this diagnosed? This condition is diagnosed based on your symptoms and medical history. A physical exam, including a pelvic exam, will also be done. You may also have other tests, including:  Tests to determine the pH level (acidity or alkalinity) of your vagina.  A whiff test to assess the odor that results when a sample of your vaginal discharge is mixed with a potassium hydroxide solution.  Tests of vaginal fluid. A sample will be examined under a microscope. How is this treated? Treatment varies depending on the type of vaginitis you have. Your treatment may include:  Antibiotic creams or pills to treat bacterial vaginosis and trichomoniasis.  Antifungal medicines, such as vaginal creams or suppositories, to treat a yeast infection.  Medicine to ease discomfort if you have viral vaginitis. Your sexual partner should also be treated.  Estrogen delivered in a cream, pill, suppository, or vaginal ring to treat atrophic vaginitis. If vaginal dryness occurs, lubricants and moisturizing creams may help. You may need to avoid scented soaps, sprays, or douches.  Stopping use of a product that is causing allergic vaginitis and then using a vaginal cream to treat the symptoms. Follow these instructions at home: Lifestyle  Keep your genital area clean and dry. Avoid soap, and only rinse the area with water.  Do not douche   or use tampons until your health care provider says it is okay. Use sanitary pads, if needed.  Do not have sex until your health care provider approves. When you can return to sex,  practice safe sex and use condoms.  Wipe from front to back. This avoids the spread of bacteria from the rectum to the vagina. General instructions  Take over-the-counter and prescription medicines only as told by your health care provider.  If you were prescribed an antibiotic medicine, take or use it as told by your health care provider. Do not stop taking or using the antibiotic even if you start to feel better.  Keep all follow-up visits. This is important. How is this prevented?  Use mild, unscented products. Do not use things that can irritate the vagina, such as fabric softeners. Avoid the following products if they are scented: ? Feminine sprays. ? Detergents. ? Tampons. ? Feminine hygiene products. ? Soaps or bubble baths.  Let air reach your genital area. To do this: ? Wear cotton underwear to reduce moisture buildup. ? Avoid wearing underwear while you sleep. ? Avoid wearing tight pants and underwear or nylons without a cotton panel. ? Avoid wearing thong underwear.  Take off any wet clothing, such as bathing suits, as soon as possible.  Practice safe sex and use condoms. Contact a health care provider if:  You have abdominal or pelvic pain.  You have a fever or chills.  You have symptoms that last for more than 2-3 days. Get help right away if:  You have a fever and your symptoms suddenly get worse. Summary  Vaginitis is a condition in which the vaginal tissue becomes inflamed.This condition is most often caused by a change in the normal balance of bacteria and yeast that live in the vagina.  Treatment varies depending on the type of vaginitis you have.  Do not douche, use tampons, or have sex until your health care provider approves. When you can return to sex, practice safe sex and use condoms. This information is not intended to replace advice given to you by your health care provider. Make sure you discuss any questions you have with your health care  provider. Document Revised: 01/09/2020 Document Reviewed: 01/09/2020 Elsevier Patient Education  2021 [employer] Inc.  

## 2020-09-17 NOTE — Progress Notes (Signed)
GYNECOLOGY  VISIT   HPI: 40 y.o.   Divorced White or Caucasian Hispanic or Latino  female   [phone] with Patient's last menstrual period was 08/17/2020 (approximate).   here for    Patient is having vaginal itching.   She c/o a 2 year h/o constant vaginal itching. In the past baking soda helped, no longer helping. The itching is on the inner lips.  Some increase in white, creamy vaginal discharge. No odor.  She has a h/o DM, she has stopped her medication. She is overdue to see her Endocrinologist.   GYNECOLOGIC HISTORY: Patient's last menstrual period was 08/17/2020 (approximate). Contraception:none  Menopausal hormone therapy: none         OB History    Gravida  4   Para  2   Term  2   Preterm      AB  2   Living  2     SAB  1   IAB      Ectopic  1   Multiple      Live Births  2              Patient Active Problem List   Diagnosis Date Noted  . 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 Throat irritation 10/20/2017  . Mixed dyslipidemia 11/01/2016  . Severe obesity (BMI >= 40) (HCC) 11/01/2016  . Smoking 11/01/2016  . Vitamin D deficiency 11/01/2016  . Anxiety state 08/16/2013  . Fetal macrosomia in pregnancy in third trimester, antepartum 07/11/2013  . Chronic hypertension complicating or reason for care during pregnancy 06/24/2013  . Hypersomnia 06/24/2013  . Diabetes mellitus, antepartum(648.03) 02/05/2013  . Obesity complicating peripregnancy, antepartum 02/05/2013  . Pregnancy 01/29/2013  . Esophageal reflux 01/04/2013  . Hypertension, essential 01/01/2013  . Type 2 diabetes mellitus with hyperglycemia, with long-term current use of insulin (HCC) 01/01/2013  . Pregnancy with history of ectopic pregnancy in first trimester 01/01/2013  . Depressive disorder 10/03/2012  . Venous (peripheral) insufficiency 10/03/2012    Past Medical History:  Diagnosis Date  . Abnormal Pap smear 2005   ASCUS +HPV  . Anxiety   . Anxiety and depression   . Depression   .  Diabetes mellitus without complication (HCC)   . Genital warts   . Hypertension   . Manic bipolar I disorder (HCC)   . Syphilis in female     Past Surgical History:  Procedure Laterality Date  . CHOLECYSTECTOMY  6/10   with liver infection, also occluded bile duct  . INTRAUTERINE DEVICE INSERTION     11-24-15 mirena iud inserted    Current Outpatient Medications  Medication Sig Dispense Refill  . ACCU-CHEK FASTCLIX LANCETS MISC 1 Units by Percutaneous route 4 (four) times daily. 100 each 12  . azelastine (OPTIVAR) 0.05 % ophthalmic solution Use as directed as needed for allergies    . canagliflozin (INVOKANA) 300 MG TABS tablet Take by mouth.    . Cholecalciferol 125 MCG (5000 UT) TABS Take by mouth.    . Dapagliflozin-metFORMIN HCl ER 11-998 MG TB24 Take by mouth.    Marland Kitchen FLUoxetine (PROZAC) 40 MG capsule     . glucose blood (BAYER CONTOUR TEST) test strip Check blood sugar level 4 times daily AM and 2 hours PP 100 each 12  . Insulin Degludec (TRESIBA FLEXTOUCH [place]) Inject into the skin. 60 units    . pioglitazone (ACTOS) 30 MG tablet Take 1 tablet once a day for diabetes    . RABEprazole (ACIPHEX) 20 MG tablet  Take by mouth.    . risperiDONE (RISPERDAL) 1 MG tablet     . TRULICITY 4.5 MG/0.5ML SOPN     . VITAMIN D PO Take 5,000 Int'l Units by mouth.     No current facility-administered medications for this visit.     ALLERGIES: Neosporin [neomycin-polymyxin-gramicidin]  Family History  Problem Relation Age of Onset  . Cancer Father        lung  . Hypertension Father   . Stroke Father   . Heart disease Father   . Hypertension Mother   . Diabetes Paternal Grandmother   . Cancer Paternal Grandfather        prostate    Social History   Socioeconomic History  . Marital status: Divorced    Spouse name: Not on file  . Number of children: Not on file  . Years of education: Not on file  . Highest education level: Not on file  Occupational History  . Not on file  Tobacco  Use  . Smoking status: Current Every Day Smoker    Types: Cigarettes    Last attempt to quit: 12/02/2012    Years since quitting: 7.7  . Smokeless tobacco: Never Used  . Tobacco comment: less than 1/2 a pack a day  Substance and Sexual Activity  . Alcohol use: Not Currently    Alcohol/week: 0.0 standard drinks  . Drug use: No  . Sexual activity: Yes    Partners: Male    Birth control/protection: I.U.D.  Other Topics Concern  . Not on file  Social History Narrative  . Not on file   Social Determinants of Health   Financial Resource Strain: Not on file  Food Insecurity: Not on file  Transportation Needs: Not on file  Physical Activity: Not on file  Stress: Not on file  Social Connections: Not on file  Intimate Partner Violence: Not on file    Review of Systems  Genitourinary:       Vaginal discharge Vaginal itching  All other systems reviewed and are negative.   PHYSICAL EXAMINATION:    BP 122/68   Pulse 78   Ht 5' 5.5" (1.664 m)   Wt (!) 342 lb 12.8 oz (155.5 kg)   LMP 08/17/2020 (Approximate)   SpO2 99%   BMI 56.18 kg/m     General appearance: alert, cooperative and appears stated age   Pelvic: External genitalia:  no lesions, diffuse erythema from the inner lips of the labia majora, down to the perianal region with fissures and some loss of architecture. No whitening, no plaques.               Urethra:  normal appearing urethra with no masses, tenderness or lesions              Bartholins and Skenes: normal                 Vagina: normal appearing vagina with no significant discharge, mild erythema              Cervix: no lesions                Chaperone was present for exam.  1. Chronic vulvitis  - WET PREP FOR TRICH, YEAST, CLUE: negative - clobetasol ointment (TEMOVATE) 0.05 %; Apply 1 application topically 2 (two) times daily. Apply as directed twice daily for 2 weeks  Dispense: 30 g; Refill: 0 - hydrOXYzine (ATARAX/VISTARIL) 10 MG tablet; Take 1  tablet (10 mg total) by mouth  3 (three) times daily as needed.  Dispense: 30 tablet; Refill: 0 - SureSwab Bacterial Vaginosis/itis -F/U in 2 weeks  2. Type 2 diabetes mellitus with hyperglycemia, with long-term current use of insulin (HCC) Not currently being treated.  Discussed the risk of untreated DM - Hemoglobin A1c

## 2020-09-18 LAB — HEMOGLOBIN A1C
Hgb A1c MFr Bld: 11.8 % of total Hgb — ABNORMAL HIGH (ref <5.7)
Mean Plasma Glucose: 292 mg/dL
eAG (mmol/L): 16.2 mmol/L

## 2020-09-25 LAB — SURESWAB BACTERIAL VAGINOSIS/ITIS
Atopobium vaginae: 7.2 Log (cells/mL)
C. albicans, DNA: DETECTED — AB
C. glabrata, DNA: NOT DETECTED
C. parapsilosis, DNA: NOT DETECTED
C. tropicalis, DNA: NOT DETECTED
Gardnerella vaginalis: 7.7 Log (cells/mL)
LACTOBACILLUS SPECIES: NOT DETECTED Log (cells/mL)
MEGASPHAERA SPECIES: 7.4 Log (cells/mL)
Trichomonas vaginalis RNA: NOT DETECTED

## 2020-09-28 ENCOUNTER — Other Ambulatory Visit: Payer: Self-pay

## 2020-09-28 MED ORDER — FLUCONAZOLE 150 MG PO TABS: ORAL_TABLET | ORAL | 0 refills | Status: DC

## 2020-09-28 MED ORDER — METRONIDAZOLE 500 MG PO TABS: 500.0000 mg | ORAL_TABLET | Freq: Two times a day (BID) | ORAL | 0 refills | Status: DC

## 2020-09-30 ENCOUNTER — Ambulatory Visit: Payer: BLUE CROSS/BLUE SHIELD | Admitting: Obstetrics and Gynecology

## 2020-11-04 NOTE — Progress Notes (Signed)
40 y.o. [id number] Divorced White or Caucasian Hispanic or Latino female here for annual exam.   She is living with her partner, occasionally sexually active. They are having issues. Not using contraception. Avoiding ovulation. Intercourse is infrequent. H/O infertility. Her oldest daughter is 52, youngest is 7. She had unprotected intercourse that whole time.  Period Cycle (Days): 28 Period Duration (Days): 5 Period Pattern: Regular Menstrual Flow: Moderate Menstrual Control: Maxi pad,Tampon Dysmenorrhea: (!) Mild Dysmenorrhea Symptoms: Cramping   H/O DM, last HgbA1C was 10.7 on 10/13/20   No longer on medication for hypertension  Psychiatrist manages her antidepressant medication.  She was treated for BV in 2/22, wasn't able to tolerate the oral flagyl and is asking for metrogel. She was also treated for yeast at the same. Initially her symptoms went away, returned in the last week. She c/o itching, no odor, slight increase in thin discharge.   LMP  11/08/2020     Sexually active: Yes.    The current method of family planning is none.    Exercising: No.  Smoker:  No, quit in 11/21.   Health Maintenance: Pap:  05/20/2019 negative, 04/22/2016 normal HRHPV negative History of abnormal Pap: yes MMG:  07/22/2020 diagnostic right breast BIRADS 3-possible right breast ultrasound 6 months. Scheduled for f/u in 6/22.  BMD:   None Colonoscopy: None TDaP:  2015 Gardasil: no, would like to   reports that she quit smoking about 4 months ago. Her smoking use included cigarettes. She has never used smokeless tobacco. She reports previous alcohol use. She reports that she does not use drugs. Daughters are 21 and 7. She is an Proofreader.   Past Medical History:  Diagnosis Date  . Abnormal Pap smear 2005   ASCUS +HPV  . Anxiety   . Anxiety and depression   . Depression   . Diabetes mellitus without complication (HCC)   . Genital warts   . Hypertension   . Manic bipolar I  disorder (HCC)   . Syphilis in female     Past Surgical History:  Procedure Laterality Date  . CHOLECYSTECTOMY  6/10   with liver infection, also occluded bile duct    Current Outpatient Medications  Medication Sig Dispense Refill  . ACCU-CHEK FASTCLIX LANCETS MISC 1 Units by Percutaneous route 4 (four) times daily. 100 each 12  . azelastine (OPTIVAR) 0.05 % ophthalmic solution Use as directed as needed for allergies    . canagliflozin (INVOKANA) 300 MG TABS tablet Take by mouth.    . Cholecalciferol 125 MCG (5000 UT) TABS Take by mouth.    . clobetasol ointment (TEMOVATE) 0.05 % Apply 1 application topically 2 (two) times daily. Apply as directed twice daily for 2 weeks 30 g 0  . Dapagliflozin-metFORMIN HCl ER 11-998 MG TB24 Take by mouth.    . fluconazole (DIFLUCAN) 150 MG tablet Take one tab po and repeat in 72 hours is symptoms persist. 2 tablet 0  . FLUoxetine (PROZAC) 40 MG capsule     . glucose blood (BAYER CONTOUR TEST) test strip Check blood sugar level 4 times daily AM and 2 hours PP 100 each 12  . hydrOXYzine (ATARAX/VISTARIL) 10 MG tablet Take 1 tablet (10 mg total) by mouth 3 (three) times daily as needed. 30 tablet 0  . Insulin Degludec (TRESIBA FLEXTOUCH [place]) Inject into the skin. 60 units    . pioglitazone (ACTOS) 30 MG tablet Take 1 tablet once a day for diabetes    . risperiDONE (RISPERDAL) 1  MG tablet     . TRULICITY 4.5 MG/0.5ML SOPN     . VITAMIN D PO Take 5,000 Int'l Units by mouth.     No current facility-administered medications for this visit.    Family History  Problem Relation Age of Onset  . Cancer Father        lung  . Hypertension Father   . Stroke Father   . Heart disease Father   . Hypertension Mother   . Diabetes Paternal Grandmother   . Cancer Paternal Grandfather        prostate    Review of Systems  All other systems reviewed and are negative.   Exam:   BP 126/80 (BP Location: Left Arm, Patient Position: Sitting, Cuff Size: Large)    Ht 5\' 5"  (1.651 m)   Wt (!) 342 lb (155.1 kg)   LMP 10/29/2020   Breastfeeding No   BMI 56.91 kg/m   Weight change: @WEIGHTCHANGE @ Height:   Height: 5\' 5"  (165.1 cm)  Ht Readings from Last 3 Encounters:  11/05/20 5\' 5"  (1.651 m)  09/17/20 5' 5.5" (1.664 m)  04/29/20 5\' 6"  (1.676 m)    General appearance: alert, cooperative and appears stated age Head: Normocephalic, without obvious abnormality, atraumatic Neck: no adenopathy, supple, symmetrical, trachea midline and thyroid normal to inspection and palpation Lungs: clear to auscultation bilaterally Cardiovascular: regular rate and rhythm Breasts: normal appearance, no masses or tenderness Abdomen: soft, non-tender; non distended,  no masses,  no organomegaly Extremities: extremities normal, atraumatic, no cyanosis or edema Skin: Skin color, texture, turgor normal. No rashes or lesions Lymph nodes: Cervical, supraclavicular, and axillary nodes normal. No abnormal inguinal nodes palpated Neurologic: Grossly normal   Pelvic: External genitalia:  no lesions              Urethra:  normal appearing urethra with no masses, tenderness or lesions              Bartholins and Skenes: normal                 Vagina: normal appearing vagina with normal color and discharge, no lesions              Cervix: no cervical motion tenderness and no lesions               Bimanual Exam:  Uterus:  no masses or tenderness              Adnexa: no mass, fullness, tenderness               Rectovaginal: Confirms               Anus:  normal sphincter tone, no lesions  chaperoned for the exam.   1. Well woman exam Discussed breast self exam Discussed calcium and vit D intake F/U mammogram is scheduled in 6/22 Labs with her primary   2. Screening examination for STD (sexually transmitted disease) - RPR - HIV Antibody (routine testing w rflx) - Hepatitis C antibody - SureSwab, Vaginosis/Vaginitis Plus   3. General counseling and advice  on female contraception Strongly encouraged the patient to use contraception. She has uncontrolled DM, last HgbA1C was 10.7. Discussed the risk of congenital anomalies with uncontrolled DM. She declines contraception. She feels that she won't get pregnant.  4. Acute vaginitis - WET PREP FOR TRICH, YEAST, CLUE - SureSwab, Vaginosis/Vaginitis Plus - clobetasol ointment (TEMOVATE) 0.05 %; Apply 1 application topically 2 (two) times daily. Apply  as directed twice daily for 2 weeks  Dispense: 15 g; Refill: 0  5. Immunization counseling Start HPV vaccination series - HPV 9-valent vaccine,Recombinat  6. Bacterial vaginitis Didn't tolerate oral flagyl - metroNIDAZOLE (METROGEL) 0.75 % vaginal gel; Place 1 Applicatorful vaginally at bedtime. Use nightly x 5 nights  Dispense: 70 g; Refill: 0  7. Type 2 diabetes mellitus with hyperglycemia, with long-term current use of insulin (HCC) Not controlled. Strongly urged to use contraception.  Recently back on medication  8. BMI 50.0-59.9, adult (HCC)

## 2020-11-05 ENCOUNTER — Other Ambulatory Visit: Payer: Self-pay

## 2020-11-05 ENCOUNTER — Ambulatory Visit (INDEPENDENT_AMBULATORY_CARE_PROVIDER_SITE_OTHER): Payer: BLUE CROSS/BLUE SHIELD | Admitting: Obstetrics and Gynecology

## 2020-11-05 ENCOUNTER — Encounter: Payer: Self-pay | Admitting: Obstetrics and Gynecology

## 2020-11-05 VITALS — BP 126/80 | Ht 65.0 in | Wt 342.0 lb

## 2020-11-05 DIAGNOSIS — N760 Acute vaginitis: Secondary | ICD-10-CM

## 2020-11-05 DIAGNOSIS — Z7185 Encounter for immunization safety counseling: Secondary | ICD-10-CM

## 2020-11-05 DIAGNOSIS — Z3009 Encounter for other general counseling and advice on contraception: Secondary | ICD-10-CM | POA: Diagnosis not present

## 2020-11-05 DIAGNOSIS — Z794 Long term (current) use of insulin: Secondary | ICD-10-CM

## 2020-11-05 DIAGNOSIS — Z113 Encounter for screening for infections with a predominantly sexual mode of transmission: Secondary | ICD-10-CM

## 2020-11-05 DIAGNOSIS — Z6841 Body Mass Index (BMI) 40.0 and over, adult: Secondary | ICD-10-CM

## 2020-11-05 DIAGNOSIS — Z01419 Encounter for gynecological examination (general) (routine) without abnormal findings: Secondary | ICD-10-CM | POA: Diagnosis not present

## 2020-11-05 DIAGNOSIS — B9689 Other specified bacterial agents as the cause of diseases classified elsewhere: Secondary | ICD-10-CM

## 2020-11-05 DIAGNOSIS — Z23 Encounter for immunization: Secondary | ICD-10-CM | POA: Diagnosis not present

## 2020-11-05 DIAGNOSIS — E1165 Type 2 diabetes mellitus with hyperglycemia: Secondary | ICD-10-CM

## 2020-11-05 LAB — WET PREP FOR TRICH, YEAST, CLUE

## 2020-11-05 MED ORDER — METRONIDAZOLE 0.75 % VA GEL: 1.0000 | Freq: Every day | VAGINAL | 0 refills | Status: DC

## 2020-11-05 MED ORDER — CLOBETASOL PROPIONATE 0.05 % EX OINT: 1.0000 "application " | TOPICAL_OINTMENT | Freq: Two times a day (BID) | CUTANEOUS | 0 refills | Status: DC

## 2020-11-05 NOTE — Patient Instructions (Signed)

## 2020-11-06 LAB — RPR: RPR Ser Ql: NONREACTIVE

## 2020-11-06 LAB — HIV ANTIBODY (ROUTINE TESTING W REFLEX): HIV 1&2 Ab, 4th Generation: NONREACTIVE

## 2020-11-06 LAB — HEPATITIS C ANTIBODY
Hepatitis C Ab: NONREACTIVE
SIGNAL TO CUT-OFF: 0.2 (ref <1.00)

## 2020-11-10 LAB — SURESWAB, VAGINOSIS/VAGINITIS PLUS
Atopobium vaginae: 6.9 Log (cells/mL)
C. albicans, DNA: NOT DETECTED
C. glabrata, DNA: NOT DETECTED
C. parapsilosis, DNA: NOT DETECTED
C. trachomatis RNA, TMA: NOT DETECTED
C. tropicalis, DNA: NOT DETECTED
Gardnerella vaginalis: 6.9 Log (cells/mL)
LACTOBACILLUS SPECIES: NOT DETECTED Log (cells/mL)
MEGASPHAERA SPECIES: 6.8 Log (cells/mL)
N. gonorrhoeae RNA, TMA: NOT DETECTED
Trichomonas vaginalis RNA: NOT DETECTED

## 2021-01-05 ENCOUNTER — Other Ambulatory Visit: Payer: Self-pay

## 2021-01-05 ENCOUNTER — Ambulatory Visit (INDEPENDENT_AMBULATORY_CARE_PROVIDER_SITE_OTHER): Payer: BLUE CROSS/BLUE SHIELD | Admitting: *Deleted

## 2021-01-05 DIAGNOSIS — Z23 Encounter for immunization: Secondary | ICD-10-CM

## 2021-01-05 NOTE — Progress Notes (Signed)
Patient given 2nd gardasil today

## 2021-01-11 ENCOUNTER — Ambulatory Visit: Payer: BLUE CROSS/BLUE SHIELD | Admitting: Nurse Practitioner

## 2021-01-11 ENCOUNTER — Other Ambulatory Visit: Payer: Self-pay

## 2021-01-11 VITALS — BP 118/72 | HR 83 | Ht 66.0 in | Wt 361.2 lb

## 2021-01-11 DIAGNOSIS — B373 Candidiasis of vulva and vagina: Secondary | ICD-10-CM

## 2021-01-11 DIAGNOSIS — B3731 Acute candidiasis of vulva and vagina: Secondary | ICD-10-CM

## 2021-01-11 DIAGNOSIS — N898 Other specified noninflammatory disorders of vagina: Secondary | ICD-10-CM

## 2021-01-11 DIAGNOSIS — B372 Candidiasis of skin and nail: Secondary | ICD-10-CM | POA: Diagnosis not present

## 2021-01-11 DIAGNOSIS — R300 Dysuria: Secondary | ICD-10-CM

## 2021-01-11 DIAGNOSIS — Z113 Encounter for screening for infections with a predominantly sexual mode of transmission: Secondary | ICD-10-CM

## 2021-01-11 LAB — URINALYSIS, COMPLETE W/RFL CULTURE
Bacteria, UA: NONE SEEN /HPF
Bilirubin Urine: NEGATIVE
Hgb urine dipstick: NEGATIVE
Hyaline Cast: NONE SEEN /LPF
Ketones, ur: NEGATIVE
Leukocyte Esterase: NEGATIVE
Nitrites, Initial: NEGATIVE
RBC / HPF: NONE SEEN /HPF (ref 0–2)
Specific Gravity, Urine: 1.02 (ref 1.001–1.035)
WBC, UA: NONE SEEN /HPF (ref 0–5)
pH: 5.5 (ref 5.0–8.0)

## 2021-01-11 LAB — NO CULTURE INDICATED

## 2021-01-11 LAB — WET PREP FOR TRICH, YEAST, CLUE

## 2021-01-11 MED ORDER — NYSTATIN-TRIAMCINOLONE 100000-0.1 UNIT/GM-% EX OINT: 1.0000 "application " | TOPICAL_OINTMENT | Freq: Two times a day (BID) | CUTANEOUS | 0 refills | Status: DC

## 2021-01-11 MED ORDER — FLUCONAZOLE 150 MG PO TABS: 150.0000 mg | ORAL_TABLET | ORAL | 0 refills | Status: DC

## 2021-01-11 NOTE — Progress Notes (Signed)
   Acute Office Visit  Subjective:    Patient ID: Diamond Pace, female    DOB: 03/05/1981, 40 y.o.   MRN: [id number]   HPI 40 y.o. presents today for vaginal itching, discharge, and itching of groin/thigh area. She feels she has always struggled with recurrent groin redness/itching. Sexually active with long-term partner, would like full STD panel today. History of syphilis.    Review of Systems  Constitutional: Negative.   Genitourinary:  Positive for dysuria and vaginal discharge.       Vaginal itching      Objective:    Physical Exam Constitutional:      Appearance: Normal appearance.  Genitourinary:    Vagina: Vaginal discharge present. No erythema.     Comments: Generalized redness to vulva/groin Skin:    Findings: Rash present.     Comments: Redness extending from mons pubis to anus. Redness also along groin/inner thighs consistent with candidiasis.    BP 118/72   Pulse 83   Ht 5\' 6"  (1.676 m)   Wt (!) 361 lb 3.2 oz (163.8 kg)   SpO2 98%   BMI 58.30 kg/m  Wt Readings from Last 3 Encounters:  01/11/21 (!) 361 lb 3.2 oz (163.8 kg)  11/05/20 (!) 342 lb (155.1 kg)  09/17/20 (!) 342 lb 12.8 oz (155.5 kg)   UA negative Wet prep + yeast     Assessment & Plan:   Problem List Items Addressed This Visit   None Visit Diagnoses     Vulvovaginal candidiasis    -  Primary   Relevant Medications   fluconazole (DIFLUCAN) 150 MG tablet   nystatin-triamcinolone ointment (MYCOLOG)   Dysuria       Relevant Orders   Urinalysis,Complete w/RFL Culture   Vaginal itching       Relevant Orders   WET PREP FOR TRICH, YEAST, CLUE   Yeast infection of the skin       Relevant Medications   fluconazole (DIFLUCAN) 150 MG tablet   nystatin-triamcinolone ointment (MYCOLOG)   Screen for STD (sexually transmitted disease)       Relevant Orders   C. trachomatis/N. gonorrhoeae RNA   RPR   HIV Antibody (routine testing w rflx)      Plan: Significant yeast infection  of skin around groin/inner thighs, as well as extending from mons pubis to anus. Wet prep positive for yeast. Diflucan 150 mg today and repeat in 3 days for total of 2 doses. Mycolog ointment to groin/thigh area twice daily x 10-14 days. STD panel pending. She is agreeable to plan.      09/19/20 DNP, 12:42 PM 01/11/2021

## 2021-01-12 ENCOUNTER — Telehealth: Payer: Self-pay | Admitting: *Deleted

## 2021-01-12 DIAGNOSIS — B372 Candidiasis of skin and nail: Secondary | ICD-10-CM

## 2021-01-12 LAB — HIV ANTIBODY (ROUTINE TESTING W REFLEX): HIV 1&2 Ab, 4th Generation: NONREACTIVE

## 2021-01-12 LAB — C. TRACHOMATIS/N. GONORRHOEAE RNA
C. trachomatis RNA, TMA: NOT DETECTED
N. gonorrhoeae RNA, TMA: NOT DETECTED

## 2021-01-12 LAB — RPR: RPR Ser Ql: NONREACTIVE

## 2021-01-12 MED ORDER — NYSTATIN 100000 UNIT/GM EX OINT: 1.0000 "application " | TOPICAL_OINTMENT | Freq: Two times a day (BID) | CUTANEOUS | 0 refills | Status: DC

## 2021-01-12 MED ORDER — TRIAMCINOLONE ACETONIDE 0.1 % EX OINT: 1.0000 "application " | TOPICAL_OINTMENT | Freq: Two times a day (BID) | CUTANEOUS | 0 refills | Status: DC

## 2021-01-12 NOTE — Telephone Encounter (Signed)
Pharmacy sent faxed stating prior authorization is needed for nystatin-triamcinolone ointment. The medication will be cheaper for the patient if send in 2 separate Rx's. Typically the Rx is either denied or too expensive for patient with insurance. And patient will be able to get Rx sooner than waiting on insurance to approve Rx.  Okay to send Rx's ?

## 2021-01-13 NOTE — Telephone Encounter (Signed)
Called and left detailed message on voicemail this has been done.

## 2021-01-15 ENCOUNTER — Other Ambulatory Visit: Payer: Self-pay | Admitting: Obstetrics and Gynecology

## 2021-01-15 ENCOUNTER — Ambulatory Visit: Payer: BLUE CROSS/BLUE SHIELD | Admitting: Obstetrics and Gynecology

## 2021-01-15 DIAGNOSIS — N6310 Unspecified lump in the right breast, unspecified quadrant: Secondary | ICD-10-CM

## 2021-01-20 ENCOUNTER — Ambulatory Visit: Payer: BLUE CROSS/BLUE SHIELD

## 2021-01-20 ENCOUNTER — Other Ambulatory Visit: Payer: Self-pay

## 2021-01-20 ENCOUNTER — Other Ambulatory Visit: Payer: Self-pay | Admitting: Obstetrics and Gynecology

## 2021-01-20 ENCOUNTER — Ambulatory Visit
Admission: RE | Admit: 2021-01-20 | Discharge: 2021-01-20 | Disposition: A | Discharge status: Home / Self Care | Payer: BLUE CROSS/BLUE SHIELD | Source: Ambulatory Visit | Attending: Obstetrics and Gynecology | Admitting: Obstetrics and Gynecology

## 2021-01-20 DIAGNOSIS — N6489 Other specified disorders of breast: Secondary | ICD-10-CM

## 2021-01-20 DIAGNOSIS — N6310 Unspecified lump in the right breast, unspecified quadrant: Secondary | ICD-10-CM

## 2021-01-27 ENCOUNTER — Ambulatory Visit
Admission: RE | Admit: 2021-01-27 | Discharge: 2021-01-27 | Disposition: A | Discharge status: Home / Self Care | Payer: BLUE CROSS/BLUE SHIELD | Source: Ambulatory Visit | Attending: Obstetrics and Gynecology | Admitting: Obstetrics and Gynecology

## 2021-01-27 ENCOUNTER — Other Ambulatory Visit: Payer: Self-pay

## 2021-01-27 ENCOUNTER — Other Ambulatory Visit: Payer: Self-pay | Admitting: Obstetrics and Gynecology

## 2021-01-27 DIAGNOSIS — N6489 Other specified disorders of breast: Secondary | ICD-10-CM

## 2021-03-08 ENCOUNTER — Ambulatory Visit: Payer: BLUE CROSS/BLUE SHIELD

## 2021-04-04 ENCOUNTER — Encounter: Payer: Self-pay | Admitting: Nurse Practitioner

## 2021-04-05 ENCOUNTER — Encounter: Payer: Self-pay | Admitting: Nurse Practitioner

## 2021-04-05 ENCOUNTER — Other Ambulatory Visit: Payer: Self-pay

## 2021-04-05 ENCOUNTER — Ambulatory Visit: Payer: BLUE CROSS/BLUE SHIELD | Admitting: Nurse Practitioner

## 2021-04-05 VITALS — BP 130/80

## 2021-04-05 DIAGNOSIS — B3731 Acute candidiasis of vulva and vagina: Secondary | ICD-10-CM

## 2021-04-05 DIAGNOSIS — N898 Other specified noninflammatory disorders of vagina: Secondary | ICD-10-CM

## 2021-04-05 DIAGNOSIS — L02219 Cutaneous abscess of trunk, unspecified: Secondary | ICD-10-CM

## 2021-04-05 DIAGNOSIS — B373 Candidiasis of vulva and vagina: Secondary | ICD-10-CM | POA: Diagnosis not present

## 2021-04-05 LAB — WET PREP FOR TRICH, YEAST, CLUE

## 2021-04-05 MED ORDER — NYSTATIN 100000 UNIT/GM EX OINT
1.0000 | TOPICAL_OINTMENT | Freq: Two times a day (BID) | CUTANEOUS | 0 refills | Status: DC
Start: 2021-04-05 — End: 2021-11-10

## 2021-04-05 MED ORDER — SULFAMETHOXAZOLE-TRIMETHOPRIM 800-160 MG PO TABS: 1.0000 | ORAL_TABLET | Freq: Two times a day (BID) | ORAL | 0 refills | Status: AC

## 2021-04-05 MED ORDER — TRIAMCINOLONE ACETONIDE 0.5 % EX OINT: 1.0000 "application " | TOPICAL_OINTMENT | Freq: Two times a day (BID) | CUTANEOUS | 0 refills | Status: DC

## 2021-04-05 NOTE — Telephone Encounter (Signed)
Spoke with patient complains of : vaginal itching, vaginal discharge(white),vaginal mass (swollen) associated with pain w/drainage.

## 2021-04-05 NOTE — Progress Notes (Signed)
   Acute Office Visit  Subjective:    Patient ID: Diamond Pace, female    DOB: July 04, 1981, 40 y.o.   MRN: [id number]   HPI 40 y.o. presents today for vulvar and perineal itching. While preparing for visit she sat up and an abscess of her pubic region ruptured. She says she is prone to pubic abscesses and this one has been present for about a week and is large and painful. She had another one in different location recently that was self-limiting. She denies fever.    Review of Systems  Constitutional: Negative.  Negative for chills and fever.  Genitourinary:  Negative for vaginal discharge.       Vulvar irritation and itching  Skin:  Positive for wound (pubic region).      Objective:    Physical Exam Constitutional:      Appearance: Normal appearance.  Genitourinary:    Vagina: Normal.     Cervix: Normal.       Comments: Generalized vulvar/perineal redness Skin:        BP 130/80 (Cuff Size: Large)   LMP 02/03/2021  Wt Readings from Last 3 Encounters:  01/11/21 (!) 361 lb 3.2 oz (163.8 kg)  11/05/20 (!) 342 lb (155.1 kg)  09/17/20 (!) 342 lb 12.8 oz (155.5 kg)        Assessment & Plan:   Problem List Items Addressed This Visit   None Visit Diagnoses     Vulvar candidiasis    -  Primary   Relevant Medications   sulfamethoxazole-trimethoprim (BACTRIM DS) 800-160 MG tablet   triamcinolone ointment (KENALOG) 0.5 %   nystatin ointment (MYCOSTATIN)   Vaginal discharge       Relevant Orders   WET PREP FOR TRICH, YEAST, CLUE (Completed)   Abscess of pubic region       Relevant Medications   sulfamethoxazole-trimethoprim (BACTRIM DS) 800-160 MG tablet   Other Relevant Orders   WOUND CULTURE      Discussed risks and benefits of the procedure, as well as the alternatives. Verbal consent obtained from patient.  Incision & drainage performed of abscess of pubic region. The area was prepped, cleaned with betadine, and locally anesthetized. #11 scalpel  used to make surgical incision over the most fluctuant area. I then expressed any fluid that I could. Drainage mostly bloody. Area cleaned and dressing applied.  Bactrim 800-160 mg twice daily x 7 days. May apply warm compresses and take Ibuprofen/Tylenol as needed for pain. Keep area clean and dry. Will treat for clinical yeast/acute vaginitis with Mycolog twice daily. Wound culture pending. She will return if wound does not improve. She is agreeable to plan.      Diamond Mackie DNP, 4:37 PM 04/05/2021

## 2021-04-08 LAB — WOUND CULTURE
MICRO NUMBER:: 12360540
SPECIMEN QUALITY:: ADEQUATE

## 2021-05-07 ENCOUNTER — Ambulatory Visit: Payer: BLUE CROSS/BLUE SHIELD

## 2021-05-11 ENCOUNTER — Ambulatory Visit: Payer: BLUE CROSS/BLUE SHIELD

## 2021-11-02 NOTE — Progress Notes (Signed)
41 y.o. [id number] Married White or Caucasian Hispanic or Latino female here for annual exam.   ?Cycles were coming monthly, occasionally skips a month.  ?Period Duration (Days): 3-6 ?Period Pattern: (!) Irregular ?Menstrual Flow: Moderate ?Menstrual Control: Maxi pad ?Menstrual Control Change Freq (Hours): 6 ?Dysmenorrhea: (!) Mild ?Dysmenorrhea Symptoms: Cramping, Diarrhea ?Sexually active (infrequently), not using any contraception. Husband would like to have a baby. She is okay with it. They have been together for 5 years, no contraception for 3 years without a pregnancy.  ? ?She c/o vulvar itching for the the last few weeks, recently worse. No d/c, no odor.  ? ?H/O DM, last HgbA1C was 13.2. She is working on control.  ? ?Patient's last menstrual period was 10/11/2021 (approximate).          ?Sexually active: Yes.    ?The current method of family planning is none.    ?Exercising: No.  The patient does not participate in regular exercise at present. ?Smoker:  yes 1/2 pack a day  ? ?Health Maintenance: ?Pap:  05/20/2019 negative, 04/22/2016 normal HRHPV negative ?History of abnormal Pap:  yes ?MMG:  01/20/21 density B Bi-rads 3 probably benign  ?BMD:   n/a ?Colonoscopy: none  ?TDaP:  06/03/13  ?Gardasil: x2 ? ? reports that she quit smoking about 17 months ago. Her smoking use included cigarettes. She has never used smokeless tobacco. She reports that she does not currently use alcohol. She reports that she does not use drugs. Daughters are 22 and 8. She is an Proofreader.  ? ?Past Medical History:  ?Diagnosis Date  ? Abnormal Pap smear 2005  ? ASCUS +HPV  ? Anxiety   ? Anxiety and depression   ? Depression   ? Diabetes mellitus without complication (HCC)   ? Genital warts   ? Hypertension   ? Manic bipolar I disorder (HCC)   ? Syphilis in female   ? ? ?Past Surgical History:  ?Procedure Laterality Date  ? CHOLECYSTECTOMY  6/10  ? with liver infection, also occluded bile duct  ? ? ?Current Outpatient  Medications  ?Medication Sig Dispense Refill  ? ACCU-CHEK FASTCLIX LANCETS MISC 1 Units by Percutaneous route 4 (four) times daily. 100 each 12  ? azelastine (OPTIVAR) 0.05 % ophthalmic solution Use as directed as needed for allergies    ? canagliflozin (INVOKANA) 300 MG TABS tablet Take by mouth.    ? Dapagliflozin-metFORMIN HCl ER 11-998 MG TB24 Take by mouth.    ? glucose blood (BAYER CONTOUR TEST) test strip Check blood sugar level 4 times daily AM and 2 hours PP 100 each 12  ? pioglitazone (ACTOS) 30 MG tablet Take 1 tablet once a day for diabetes    ? triamcinolone ointment (KENALOG) 0.5 % Apply 1 application topically 2 (two) times daily. 30 g 0  ? TRULICITY 4.5 MG/0.5ML SOPN     ? Insulin Degludec (TRESIBA FLEXTOUCH [place]) Inject into the skin. 60 units    ? ?No current facility-administered medications for this visit.  ? ? ?Family History  ?Problem Relation Age of Onset  ? Cancer Father   ?     lung  ? Hypertension Father   ? Stroke Father   ? Heart disease Father   ? Hypertension Mother   ? Diabetes Paternal Grandmother   ? Cancer Paternal Grandfather   ?     prostate  ? ? ?Review of Systems  ?All other systems reviewed and are negative. ? ?Exam:   ?BP 128/64  Pulse 98   Ht 5\' 6"  (1.676 m)   Wt (!) 357 lb (161.9 kg)   LMP 10/11/2021 (Approximate)   SpO2 100%   BMI 57.62 kg/m?   Weight change: @WEIGHTCHANGE @ Height:   Height: 5\' 6"  (167.6 cm)  ?Ht Readings from Last 3 Encounters:  ?11/10/21 5\' 6"  (1.676 m)  ?01/11/21 5\' 6"  (1.676 m)  ?11/05/20 5\' 5"  (1.651 m)  ? ? ?General appearance: alert, cooperative and appears stated age ?Head: Normocephalic, without obvious abnormality, atraumatic ?Neck: no adenopathy, supple, symmetrical, trachea midline and thyroid normal to inspection and palpation ?Lungs: clear to auscultation bilaterally ?Cardiovascular: regular rate and rhythm ?Breasts: normal appearance, no masses or tenderness ?Abdomen: soft, non-tender; non distended,  no masses,  no  organomegaly ?Extremities: extremities normal, atraumatic, no cyanosis or edema ?Skin: Skin color, texture, turgor normal. No rashes or lesions ?Lymph nodes: Cervical, supraclavicular, and axillary nodes normal. ?No abnormal inguinal nodes palpated ?Neurologic: Grossly normal ? ? ?Pelvic: External genitalia:  no lesions ?             Urethra:  normal appearing urethra with no masses, tenderness or lesions ?             Bartholins and Skenes: normal    ?             Vagina: normal appearing vagina with normal color and discharge, no lesions ?             Cervix: no lesions ?              ?Bimanual Exam:  Uterus:   no masses or tenderness, exam limited by BMI ?             Adnexa: no mass, fullness, tenderness ?              Rectovaginal: Confirms ?              Anus:  normal sphincter tone, no lesions ? ?11/12/21 chaperoned for the exam. ? ?1. Well woman exam ?Discussed breast self exam ?Discussed calcium and vit D intake ?Mammogram after she turns 41 in 8/23 ? ?2. Screening for cervical cancer ?- Cytology - PAP ? ?3. Type 2 diabetes mellitus with hyperglycemia, with long-term current use of insulin (HCC) ?Uncontrolled, she is working on getting it under control ? ?4. General counseling and advice on female contraception ?Strongly encouraged the patient to use contraception. Even with a h/o infertility, she could still get pregnant. Stressed the extreme importance of having her DM under control prior to getting pregnant. Discussed congenital malformations.  ?-She declines hormonal contraception or IUD's ?-Given condoms, states she will use them ? ?5. Acute vulvitis ?- WET PREP FOR TRICH, YEAST, CLUE: negative ?- betamethasone valerate ointment (VALISONE) 0.1 %; Apply 1 application. topically 2 (two) times daily. Use for up to 2 weeks as needed  Dispense: 30 g; Refill: 0 ?-vulvar skin care information given and reviewed ?-use of vaseline encouraged.  ? ?6. BMI 50.0-59.9, adult (HCC) ? ?7. Smoker ?Not currently  wanting to quit, has been able to quit in the past.  ? ? ?

## 2021-11-10 ENCOUNTER — Encounter: Payer: Self-pay | Admitting: Obstetrics and Gynecology

## 2021-11-10 ENCOUNTER — Ambulatory Visit (INDEPENDENT_AMBULATORY_CARE_PROVIDER_SITE_OTHER): Payer: BC Managed Care – PPO | Admitting: Obstetrics and Gynecology

## 2021-11-10 ENCOUNTER — Other Ambulatory Visit (HOSPITAL_COMMUNITY)
Admission: RE | Admit: 2021-11-10 | Discharge: 2021-11-10 | Disposition: A | Discharge status: Home / Self Care | Payer: BC Managed Care – PPO | Source: Ambulatory Visit | Attending: Obstetrics and Gynecology | Admitting: Obstetrics and Gynecology

## 2021-11-10 VITALS — BP 128/64 | HR 98 | Ht 66.0 in | Wt 357.0 lb

## 2021-11-10 DIAGNOSIS — Z3009 Encounter for other general counseling and advice on contraception: Secondary | ICD-10-CM

## 2021-11-10 DIAGNOSIS — Z124 Encounter for screening for malignant neoplasm of cervix: Secondary | ICD-10-CM | POA: Insufficient documentation

## 2021-11-10 DIAGNOSIS — E1165 Type 2 diabetes mellitus with hyperglycemia: Secondary | ICD-10-CM | POA: Diagnosis not present

## 2021-11-10 DIAGNOSIS — N762 Acute vulvitis: Secondary | ICD-10-CM

## 2021-11-10 DIAGNOSIS — Z01419 Encounter for gynecological examination (general) (routine) without abnormal findings: Secondary | ICD-10-CM | POA: Diagnosis not present

## 2021-11-10 DIAGNOSIS — Z6841 Body Mass Index (BMI) 40.0 and over, adult: Secondary | ICD-10-CM

## 2021-11-10 DIAGNOSIS — F17200 Nicotine dependence, unspecified, uncomplicated: Secondary | ICD-10-CM

## 2021-11-10 DIAGNOSIS — Z794 Long term (current) use of insulin: Secondary | ICD-10-CM

## 2021-11-10 LAB — WET PREP FOR TRICH, YEAST, CLUE

## 2021-11-10 MED ORDER — BETAMETHASONE VALERATE 0.1 % EX OINT: 1.0000 | TOPICAL_OINTMENT | Freq: Two times a day (BID) | CUTANEOUS | 0 refills | Status: DC

## 2021-11-10 NOTE — Patient Instructions (Signed)

## 2021-11-11 LAB — CYTOLOGY - PAP
Comment: NEGATIVE
Diagnosis: NEGATIVE
High risk HPV: NEGATIVE

## 2022-08-15 ENCOUNTER — Other Ambulatory Visit: Payer: Self-pay | Admitting: Family Medicine

## 2022-08-15 DIAGNOSIS — Z1231 Encounter for screening mammogram for malignant neoplasm of breast: Secondary | ICD-10-CM

## 2022-10-03 ENCOUNTER — Encounter: Payer: Self-pay | Admitting: Neurology

## 2022-10-03 ENCOUNTER — Telehealth: Payer: Self-pay | Admitting: Neurology

## 2022-10-03 ENCOUNTER — Ambulatory Visit: Payer: BC Managed Care – PPO | Admitting: Neurology

## 2022-10-03 VITALS — BP 136/92 | HR 81 | Ht 66.0 in | Wt 356.0 lb

## 2022-10-03 DIAGNOSIS — G8929 Other chronic pain: Secondary | ICD-10-CM

## 2022-10-03 DIAGNOSIS — R519 Headache, unspecified: Secondary | ICD-10-CM

## 2022-10-03 DIAGNOSIS — G3184 Mild cognitive impairment, so stated: Secondary | ICD-10-CM | POA: Diagnosis not present

## 2022-10-03 DIAGNOSIS — F32A Depression, unspecified: Secondary | ICD-10-CM

## 2022-10-03 DIAGNOSIS — F419 Anxiety disorder, unspecified: Secondary | ICD-10-CM | POA: Diagnosis not present

## 2022-10-03 MED ORDER — MAGNESIUM OXIDE 400 MG PO CAPS: 400.0000 mg | ORAL_CAPSULE | Freq: Every evening | ORAL | 2 refills | Status: DC

## 2022-10-03 MED ORDER — SUMATRIPTAN SUCCINATE 50 MG PO TABS: 50.0000 mg | ORAL_TABLET | ORAL | 11 refills | Status: DC | PRN

## 2022-10-03 NOTE — Progress Notes (Signed)
[facility]  PATIENT: Diamond Pace DOB: Mar 31, 1981  REQUESTING CLINICIAN: Bartholome Bill, MD HISTORY FROM: Patient  REASON FOR VISIT: Memory problems, headaches    HISTORICAL  CHIEF COMPLAINT:  Chief Complaint  Patient presents with   New Patient (Initial Visit)    Rm 12. Alone. NP/Paper/WF Zella Richer MD/headaches, memory loss.    HISTORY OF PRESENT ILLNESS:  This is a 42 year old woman with multiple reported medical conditions including anxiety, depression, bipolar 1 with manic episodes, currently not on any medications, diabetes, obesity who is presenting with memory problems going on for the past few years.  Patient described memory problem as being forgetful, she is forgetting names, forgetting faces. The other day she reported talking to her brother over the phone, initially she did not recognize it was her brother then she reported she was not aware of her brother spoke spanish and lastly she reported she was not aware that her brother moved to [place].  She does report recent trip in [place] that she does not remember.  She reported 2 years ago she forgot to pick up her daughter at school. Patient does tell me that she forgot she is diabetic, forgets to take her meds and now she has write everything down, put everything on her phone as alarm or else she will forget.  She is still able to drive, denies any recent accident but will get loss going to familiar places.  When going shopping she will spend extra additional time looking for items, because she forget to wrote them down. She denies any family history of dementia but report currently experiencing anxiety, depression, has a history of bipolar.  Currently she is not taking any medication because the previous medications have given her gastroparesis.  She is not getting therapy but she had does have a psychiatrist   Patient also complains of headaches, right-sided headache described  as cramping squeezing sometimes entire head.  On average in a week she will have 6 days of headaches, sometimes she will go to sleep with a headache and wake up with a headache.  Headaches are mainly right sided with light sensitivity, no nausea, no vomiting. At most she will try Tylenol but otherwise has not tried any other medications and has not been on a preventive medicine.     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   Mood: Yes, diagnosed with anxiety and depression Family history of Dementia:   Denies  Functional status: independent in most ADLs and IADLs Patient lives with husband, and daughter and 2 step sons. Cooking: yes, but will forget she is cooking and burn the food  Cleaning: yes Shopping: Yes, but will stay longer but will forgot what she needs  Bathing: no help needed  Toileting: no help needed  Driving: Yes, but has been lost while driving  Bills: Been late on her bills because she forget to pay them  Medications: Yes, sometime will forget to pay her meds  Ever left the stove on by accident?: Yes  Forget how to use items around the house?: No  Getting lost going to familiar places?: yes Forgetting loved ones names?: Yes  Word finding difficulty? Yes  Sleep: Good    OTHER MEDICAL CONDITIONS: Anxiety, Depression, Bipolar I with manic, (not on any meds), Diabetes, Obesity  REVIEW OF SYSTEMS: Full 14 system review of systems performed and negative with exception of: As noted in the HPI   ALLERGIES: Allergies  Allergen Reactions   Neosporin [Neomycin-Polymyxin-Gramicidin] Dermatitis and Rash    HOME MEDICATIONS: Outpatient Medications Prior to Visit  Medication Sig Dispense Refill   ACCU-CHEK FASTCLIX LANCETS MISC 1 Units by Percutaneous route 4 (four) times daily. 100 each 12   atorvastatin (LIPITOR) 10 MG tablet Take 10 mg by mouth daily.      azelastine (OPTIVAR) 0.05 % ophthalmic solution Use as directed as needed for allergies     betamethasone valerate ointment (VALISONE) 0.1 % Apply 1 application. topically 2 (two) times daily. Use for up to 2 weeks as needed 30 g 0   canagliflozin (INVOKANA) 300 MG TABS tablet Take by mouth.     Continuous Blood Gluc Sensor (FREESTYLE LIBRE 3 SENSOR) MISC SMARTSIG:1 SUB-Q Every 2 Weeks     CREON 36000-114000 units CPEP capsule Take 36,000 Units by mouth 3 (three) times daily.     dapagliflozin propanediol (FARXIGA) 10 MG TABS tablet Take 10 mg by mouth daily.     Dapagliflozin-metFORMIN HCl ER 11-998 MG TB24 Take by mouth.     Fexofenadine HCl (ALLEGRA PO) Take 1 tablet by mouth daily.     FIASP FLEXTOUCH 100 UNIT/ML FlexTouch Pen Inject into the skin 3 (three) times daily as needed.     glucose blood (BAYER CONTOUR TEST) test strip Check blood sugar level 4 times daily AM and 2 hours PP 100 each 12   Insulin Degludec (TRESIBA FLEXTOUCH [place]) Inject into the skin. 60 units     metFORMIN (GLUCOPHAGE-XR) 500 MG 24 hr tablet Take 1,000 mg by mouth 2 (two) times daily with a meal.     montelukast (SINGULAIR) 10 MG tablet Take 10 mg by mouth at bedtime.     pioglitazone (ACTOS) 30 MG tablet Take 1 tablet once a day for diabetes     triamcinolone ointment (KENALOG) 0.5 % Apply 1 application topically 2 (two) times daily. 30 g 0   TRULICITY 4.5 0000000 SOPN      Vitamin D, Ergocalciferol, (DRISDOL) 1.25 MG (50000 UNIT) CAPS capsule Take 50,000 Units by mouth once a week.     No facility-administered medications prior to visit.    PAST MEDICAL HISTORY: Past Medical History:  Diagnosis Date   Abnormal Pap smear 2005   ASCUS +HPV   Anxiety    Anxiety and depression    Depression    Diabetes mellitus without complication (HCC)    Genital warts    Hypertension    Manic bipolar I disorder ([place])    Syphilis in female     PAST SURGICAL HISTORY: Past Surgical History:  Procedure Laterality  Date   CHOLECYSTECTOMY  6/10   with liver infection, also occluded bile duct    FAMILY HISTORY: Family History  Problem Relation Age of Onset   Cancer Father        lung   Hypertension Father    Stroke Father    Heart disease Father    Hypertension Mother    Diabetes Paternal Grandmother    Cancer Paternal Grandfather        prostate    SOCIAL HISTORY: Social History   Socioeconomic History   Marital status: Married    Spouse name: Not on file   Number of children: Not on file   Years of education: Not on file   Highest education level: Not on file  Occupational History   Not on file  Tobacco Use   Smoking status: Former    Types: Cigarettes    Quit date: 06/07/2020    Years since quitting: 2.3   Smokeless tobacco: Never   Tobacco comments:    less than 1/2 a pack a day  Substance and Sexual Activity   Alcohol use: Not Currently    Alcohol/week: 0.0 standard drinks of alcohol   Drug use: No   Sexual activity: Yes    Partners: Male    Birth control/protection: None  Other Topics Concern   Not on file  Social History Narrative   Not on file   Social Determinants of Health   Financial Resource Strain: Not on file  Food Insecurity: Not on file  Transportation Needs: Not on file  Physical Activity: Not on file  Stress: Not on file  Social Connections: Not on file  Intimate Partner Violence: Not on file    PHYSICAL EXAM  GENERAL EXAM/CONSTITUTIONAL: Vitals:  Vitals:   10/03/22 0903  BP: (!) 136/92  Pulse: 81  Weight: (!) 356 lb (161.5 kg)  Height: '5\' 6"'$  (1.676 m)   Body mass index is 57.46 kg/m. Wt Readings from Last 3 Encounters:  10/03/22 (!) 356 lb (161.5 kg)  11/10/21 (!) 357 lb (161.9 kg)  01/11/21 (!) 361 lb 3.2 oz (163.8 kg)   Patient is in no distress; well developed, nourished and groomed; neck is supple   EYES: Visual fields full to confrontation, Extraocular movements intacts,   MUSCULOSKELETAL: Gait, strength, tone, movements  noted in Neurologic exam below  NEUROLOGIC: MENTAL STATUS:      No data to display         awake, alert, oriented to person, place and time language fluent, comprehension intact, naming intact fund of knowledge appropriate    10/03/2022    9:05 AM  Montreal Cognitive Assessment   Visuospatial/ Executive (0/5) 3  Naming (0/3) 3  Attention: Read list of digits (0/2) 2  Attention: Read list of letters (0/1) 1  Attention: Serial 7 subtraction starting at 100 (0/3) 3  Language: Repeat phrase (0/2) 0  Language : Fluency (0/1) 0  Abstraction (0/2) 2  Delayed Recall (0/5) 3  Orientation (0/6) 5  Total 22     CRANIAL NERVE:  2nd, 3rd, 4th, 6th- visual fields full to confrontation, extraocular muscles intact, no nystagmus 5th - facial sensation symmetric 7th - facial strength symmetric 8th - hearing intact 9th - palate elevates symmetrically, uvula midline 11th - shoulder shrug symmetric 12th - tongue protrusion midline  MOTOR:  normal bulk and tone, full strength in the BUE, BLE  SENSORY:  normal and symmetric to light touch  COORDINATION:  finger-nose-finger, fine finger movements normal  GAIT/STATION:  normal     DIAGNOSTIC DATA (LABS, IMAGING, TESTING) - I reviewed patient records, labs, notes, testing and imaging myself where available.  Lab Results  Component Value Date   WBC 9.0 12/06/2018   HGB 13.6 12/06/2018   HCT 42.7 12/06/2018   MCV 89.0 12/06/2018   PLT 295 12/06/2018      Component Value Date/Time   NA 134 (L) 12/06/2018 1947   K 4.0 12/06/2018 1947   CL 100 12/06/2018 1947   CO2 23 12/06/2018 1947   GLUCOSE 365 (H) 12/06/2018 1947   BUN 12 12/06/2018 1947   CREATININE 0.69 12/06/2018 1947   CREATININE 0.57 07/29/2013 1036   CALCIUM 9.1 12/06/2018 1947   PROT 7.4 12/06/2018 1947  ALBUMIN 3.7 12/06/2018 1947   AST 13 (L) 12/06/2018 1947   ALT 18 12/06/2018 1947   ALKPHOS 88 12/06/2018 1947   BILITOT 0.4 12/06/2018 1947   GFRNONAA  >60 12/06/2018 1947   GFRAA >60 12/06/2018 1947   No results found for: "CHOL", "HDL", "LDLCALC", "LDLDIRECT", "TRIG", "CHOLHDL" Lab Results  Component Value Date   HGBA1C 11.8 (H) 09/17/2020   No results found for: "VITAMINB12" Lab Results  Component Value Date   TSH 1.650 01/02/2020    Outside labs  B12   380 Folate  9.9 RPR   Negative  TSH  2.54 Vit D  11    Head CT 08/29/2022 1. No intracranial abnormalities.    ASSESSMENT AND PLAN  42 y.o. year old female with multiple reported medical conditions including anxiety, depression, bipolar type I, diabetes mellitus, obesity who is presenting with memory decline for the past few years and now getting worse.  Patient reports that she is more forgetful now, will forget names of family members, forget how to get to familiar places.  She has seen her primary care doctor in the beginning of the year has a normal Mini-Mental status evaluation 30 out of 30, she could draw clock at that time.  Her labs were within normal limits including a normal head CT but today on exam she scored 22 on the MoCA indicative of some impairment.  I have told patient that I believe her symptoms are mainly due to anxiety and depression and untreated bipolar.  She does have a psychiatrist but is not on any medication and she is not getting therapy.  I did advise her to follow-up with psychiatry to discuss therapy, possibly medicine but I will also refer her for a formal neuropsychological testing to have a baseline.  She voices understanding. In terms of the headaches, I will start her on a preventive medication.  We discussed starting Elavil but she reported previous medications have given her gastroparesis so I will start try first magnesium oxide.  If the magnesium oxide is not enough to help with the headaches, then we will try Elavil.  Also recommended her to continue with Tylenol as needed but no more than 3 days a week and also to use Imitrex as needed for  severe pain.  I will see her in 6 months for follow-up.   1. Mild cognitive impairment   2. Anxiety   3. Depression, unspecified depression type   4. Chronic nonintractable headache, unspecified headache type      Patient Instructions  Start with magnesium 400 mg for headaches prevention  Use Sumatriptan as needed for headaches but no more than 2 tablets in a 24 hrs period  Can use Tylenol as needed for no more than 3 days a week  Referral for formal neuropsychiatric testing  Return in 6 months    Orders Placed This Encounter  Procedures   Ambulatory referral to Neuropsychology    Meds ordered this encounter  Medications   SUMAtriptan (IMITREX) 50 MG tablet    Sig: Take 1 tablet (50 mg total) by mouth every 2 (two) hours as needed for migraine. May repeat in 2 hours if headache persists or recurs.    Dispense:  10 tablet    Refill:  11   Magnesium Oxide 400 MG CAPS    Sig: Take 1 capsule (400 mg total) by mouth at bedtime.    Dispense:  90 capsule    Refill:  2    Return in  about 6 months (around 04/05/2023).  I have spent a total of 65 minutes dedicated to this patient today, preparing to see patient, performing a medically appropriate examination and evaluation, ordering tests and/or medications and procedures, and counseling and educating the patient/family/caregiver; independently interpreting result and communicating results to the family/patient/caregiver; and documenting clinical information in the electronic medical record.   Alric Ran, MD 10/03/2022, 10:26 AM  [facility] [address], [place], [place] [address] [phone]

## 2022-10-03 NOTE — Patient Instructions (Addendum)
Start with magnesium 400 mg for headaches prevention  Use Sumatriptan as needed for headaches but no more than 2 tablets in a 24 hrs period  Can use Tylenol as needed for no more than 3 days a week  Referral for formal neuropsychiatric testing  Return in 6 months

## 2022-10-03 NOTE — Telephone Encounter (Signed)
Referral for neuropsychology fax to [facility]. Phone:[phone], Fax: [phone] 

## 2023-04-05 ENCOUNTER — Encounter: Payer: Self-pay | Admitting: Neurology

## 2023-04-05 ENCOUNTER — Ambulatory Visit: Payer: BC Managed Care – PPO | Admitting: Neurology

## 2023-04-05 VITALS — BP 136/80 | Ht 66.0 in | Wt 355.0 lb

## 2023-04-05 DIAGNOSIS — G8929 Other chronic pain: Secondary | ICD-10-CM

## 2023-04-05 DIAGNOSIS — G3184 Mild cognitive impairment, so stated: Secondary | ICD-10-CM | POA: Diagnosis not present

## 2023-04-05 DIAGNOSIS — F32A Depression, unspecified: Secondary | ICD-10-CM

## 2023-04-05 DIAGNOSIS — R519 Headache, unspecified: Secondary | ICD-10-CM | POA: Diagnosis not present

## 2023-04-05 DIAGNOSIS — F419 Anxiety disorder, unspecified: Secondary | ICD-10-CM

## 2023-04-05 MED ORDER — TOPIRAMATE 50 MG PO TABS: ORAL_TABLET | ORAL | 3 refills | Status: DC

## 2023-04-05 MED ORDER — AMITRIPTYLINE HCL 25 MG PO TABS
ORAL_TABLET | ORAL | 6 refills | Status: DC
Start: 2023-04-05 — End: 2023-04-05

## 2023-04-05 NOTE — Patient Instructions (Addendum)
Discontinue Magnesium (side effect of heartburn per patient)  Start Topiramate 50 mg 1/2 tablet nightly for one week then increase to full tablet. Please contact me if you do have side effects Continue with Sumatriptan as needed for the headaches  Continue your other medications  MRI Brain to rule out IIH Follow up with psychiatry for management of anxiety/depression, bipolar disorder  Follow up with neuropsych as scheduled  Return in 6 months

## 2023-04-05 NOTE — Progress Notes (Signed)
[facility]  PATIENT: Diamond Pace DOB: [birth year]  REQUESTING CLINICIAN: Verlon Au, MD HISTORY FROM: Patient  REASON FOR VISIT: Memory problems, headaches    HISTORICAL  CHIEF COMPLAINT:  Chief Complaint  Patient presents with   Follow-up    Rm  13, headache and memory follow up   INTERVAL HISTORY 04/05/2023:  Patient presents today for follow-up, she tells me that she does not remember meeting me in March.  In terms of his her memory, she is still reporting the same symptoms.  She has not followed up with psychiatry and has not had her neuropsych evaluation completed. When it comes to the headaches, she continued to have daily headaches.  She reports the week that she was on magnesium, her headache seems to get better but she did develop worsening heartburn therefore she discontinued her magnesium.  She did not call the office.  Currently she is not on any preventive medication but states that the sumatriptan does help ease the headaches.  She continued to have headaches, reports daily headaches, with varying intensities.   HISTORY OF PRESENT ILLNESS:  This is a 42 year old woman with multiple reported medical conditions including anxiety, depression, bipolar 1 with manic episodes, currently not on any medications, diabetes, obesity who is presenting with memory problems going on for the past few years.  Patient described memory problem as being forgetful, she is forgetting names, forgetting faces. The other day she reported talking to her brother over the phone, initially she did not recognize it was her brother then she reported she was not aware of her brother spoke spanish and lastly she reported she was not aware that her brother moved to [place].  She does report recent trip in [place] that she does not remember.  She reported 2 years ago she forgot to pick up her daughter at school. Patient does tell me that she forgot she is diabetic,  forgets to take her meds and now she has write everything down, put everything on her phone as alarm or else she will forget.  She is still able to drive, denies any recent accident but will get loss going to familiar places.  When going shopping she will spend extra additional time looking for items, because she forget to wrote them down. She denies any family history of dementia but report currently experiencing anxiety, depression, has a history of bipolar.  Currently she is not taking any medication because the previous medications have given her gastroparesis.  She is not getting therapy but she had does have a psychiatrist   Patient also complains of headaches, right-sided headache described as cramping squeezing sometimes entire head.  On average in a week she will have 6 days of headaches, sometimes she will go to sleep with a headache and wake up with a headache.  Headaches are mainly right sided with light sensitivity, no nausea, no vomiting. At most she will try Tylenol but otherwise has not tried any other medications and has not been on a preventive medicine.    TBI:   Larey Seat as a child and landed on cement  Stroke:   no past history of stroke Seizures:   no past history of seizures Sleep:   no history of sleep apnea.   Mood: Yes, diagnosed with anxiety and depression Family history of Dementia:   Denies  Functional status: independent in most ADLs and IADLs Patient lives with husband, and daughter and 2 step sons. Cooking: yes, but will forget she is  cooking and burn the food  Cleaning: yes Shopping: Yes, but will stay longer but will forgot what she needs  Bathing: no help needed  Toileting: no help needed  Driving: Yes, but has been lost while driving  Bills: Been late on her bills because she forget to pay them  Medications: Yes, sometime will forget to pay her meds  Ever left the stove on by accident?: Yes  Forget how to use items around the house?: No  Getting lost going  to familiar places?: yes Forgetting loved ones names?: Yes  Word finding difficulty? Yes  Sleep: Good    OTHER MEDICAL CONDITIONS: Anxiety, Depression, Bipolar I with manic, (not on any meds), Diabetes, Obesity    REVIEW OF SYSTEMS: Full 14 system review of systems performed and negative with exception of: As noted in the HPI   ALLERGIES: Allergies  Allergen Reactions   Neosporin [Neomycin-Polymyxin-Gramicidin] Dermatitis and Rash    HOME MEDICATIONS: Outpatient Medications Prior to Visit  Medication Sig Dispense Refill   ACCU-CHEK FASTCLIX LANCETS MISC 1 Units by Percutaneous route 4 (four) times daily. 100 each 12   atorvastatin (LIPITOR) 10 MG tablet Take 10 mg by mouth daily.     azelastine (OPTIVAR) 0.05 % ophthalmic solution Use as directed as needed for allergies     betamethasone valerate ointment (VALISONE) 0.1 % Apply 1 application. topically 2 (two) times daily. Use for up to 2 weeks as needed 30 g 0   Continuous Blood Gluc Sensor (FREESTYLE LIBRE 3 SENSOR) MISC SMARTSIG:1 SUB-Q Every 2 Weeks     CREON 36000-114000 units CPEP capsule Take 36,000 Units by mouth 3 (three) times daily.     dapagliflozin propanediol (FARXIGA) 10 MG TABS tablet Take 10 mg by mouth daily.     Dapagliflozin-metFORMIN HCl ER 11-998 MG TB24 Take by mouth.     Fexofenadine HCl (ALLEGRA PO) Take 1 tablet by mouth daily.     FIASP FLEXTOUCH 100 UNIT/ML FlexTouch Pen Inject into the skin 3 (three) times daily as needed.     glucose blood (BAYER CONTOUR TEST) test strip Check blood sugar level 4 times daily AM and 2 hours PP 100 each 12   Insulin Degludec (TRESIBA FLEXTOUCH Tonsina) Inject into the skin. 60 units     metFORMIN (GLUCOPHAGE-XR) 500 MG 24 hr tablet Take 1,000 mg by mouth 2 (two) times daily with a meal.     montelukast (SINGULAIR) 10 MG tablet Take 10 mg by mouth at bedtime.     pioglitazone (ACTOS) 30 MG tablet Take 1 tablet once a day for diabetes     SUMAtriptan (IMITREX) 50 MG tablet Take  1 tablet (50 mg total) by mouth every 2 (two) hours as needed for migraine. May repeat in 2 hours if headache persists or recurs. 10 tablet 11   triamcinolone ointment (KENALOG) 0.5 % Apply 1 application topically 2 (two) times daily. 30 g 0   Vitamin D, Ergocalciferol, (DRISDOL) 1.25 MG (50000 UNIT) CAPS capsule Take 50,000 Units by mouth once a week.     Magnesium Oxide 400 MG CAPS Take 1 capsule (400 mg total) by mouth at bedtime. 90 capsule 2   canagliflozin (INVOKANA) 300 MG TABS tablet Take by mouth. (Patient not taking: Reported on 04/05/2023)     TRULICITY 4.5 MG/0.5ML SOPN  (Patient not taking: Reported on 04/05/2023)     No facility-administered medications prior to visit.    PAST MEDICAL HISTORY: Past Medical History:  Diagnosis Date   Abnormal Pap  smear 2005   ASCUS +HPV   Anxiety    Anxiety and depression    Depression    Diabetes mellitus without complication (HCC)    Genital warts    Hypertension    Manic bipolar I disorder (HCC)    Syphilis in female     PAST SURGICAL HISTORY: Past Surgical History:  Procedure Laterality Date   CHOLECYSTECTOMY  6/10   with liver infection, also occluded bile duct    FAMILY HISTORY: Family History  Problem Relation Age of Onset   Cancer Father        lung   Hypertension Father    Stroke Father    Heart disease Father    Hypertension Mother    Diabetes Paternal Grandmother    Cancer Paternal Grandfather        prostate    SOCIAL HISTORY: Social History   Socioeconomic History   Marital status: Married    Spouse name: Not on file   Number of children: Not on file   Years of education: Not on file   Highest education level: Not on file  Occupational History   Not on file  Tobacco Use   Smoking status: Former    Current packs/day: 0.00    Types: Cigarettes    Quit date: 06/07/2020    Years since quitting: 2.8   Smokeless tobacco: Never   Tobacco comments:    less than 1/2 a pack a day  Substance and Sexual  Activity   Alcohol use: Not Currently    Alcohol/week: 0.0 standard drinks of alcohol   Drug use: No   Sexual activity: Yes    Partners: Male    Birth control/protection: None  Other Topics Concern   Not on file  Social History Narrative   Right handed   Caffeine-10-12 cups daily   Social Determinants of Health   Financial Resource Strain: Low Risk  (08/02/2021)   Received from [facility] visits prior to 09/24/2022., [facility] visits prior to 09/24/2022.   Overall Financial Resource Strain (CARDIA)    Difficulty of Paying Living Expenses: Not very hard  Food Insecurity: Low Risk  (10/25/2022)   [facility]   Hunger Vital Sign    Worried About Running Out of Food in the Last Year: Never true    Ran Out of Food in the Last Year: Never true  Transportation Needs: Not on file (10/25/2022)  Physical Activity: Inactive (08/02/2021)   Received from [facility] visits prior to 09/24/2022., [facility] visits prior to 09/24/2022.   Exercise Vital Sign    Days of Exercise per Week: 0 days    Minutes of Exercise per Session: 0 min  Stress: Stress Concern Present (08/02/2021)   [facility] prior to 09/24/2022., [facility] visits prior to 09/24/2022.   [employer] of Occupational Health - Occupational Stress Questionnaire    Feeling of Stress : Rather much  Social Connections: Moderately Isolated (08/02/2021)   Received from [facility] visits prior to 09/24/2022., [facility] visits prior to 09/24/2022.   Social Connection and Isolation Panel [NHANES]    Frequency of Communication with Friends and Family: Three times a week    Frequency of Social Gatherings with Friends and Family: Once a week    Attends Religious Services: Never    Active Member of [employer]  or Organizations: No    Attends Tax inspector Meetings: Never    Marital Status: Married  Catering manager Violence: Not At Risk (08/02/2021)   [facility] visits prior to 09/24/2022., [facility] visits prior to 09/24/2022.   Humiliation, Afraid, Rape, and Kick questionnaire    Fear of Current or Ex-Partner: No    Emotionally Abused: No    Physically Abused: No    Sexually Abused: No    PHYSICAL EXAM  GENERAL EXAM/CONSTITUTIONAL: Vitals:  Vitals:   04/05/23 0921  BP: 136/80  Weight: (!) 355 lb (161 kg)  Height: 5\' 6"  (1.676 m)   Body mass index is 57.3 kg/m. Wt Readings from Last 3 Encounters:  04/05/23 (!) 355 lb (161 kg)  10/03/22 (!) 356 lb (161.5 kg)  11/10/21 (!) 357 lb (161.9 kg)   Patient is in no distress; well developed, nourished and groomed; neck is supple. Obese woman   MUSCULOSKELETAL: Gait, strength, tone, movements noted in Neurologic exam below  NEUROLOGIC: MENTAL STATUS:      No data to display         awake, alert, oriented to person, place and time language fluent, comprehension intact, naming intact fund of knowledge appropriate    10/03/2022    9:05 AM  [facility] Cognitive Assessment   Visuospatial/ Executive (0/5) 3  Naming (0/3) 3  Attention: Read list of digits (0/2) 2  Attention: Read list of letters (0/1) 1  Attention: Serial 7 subtraction starting at 100 (0/3) 3  Language: Repeat phrase (0/2) 0  Language : Fluency (0/1) 0  Abstraction (0/2) 2  Delayed Recall (0/5) 3  Orientation (0/6) 5  Total 22    CRANIAL NERVE:  2nd, 3rd, 4th, 6th- visual fields full to confrontation, extraocular muscles intact, no nystagmus 5th - facial sensation symmetric 7th - facial strength symmetric 8th - hearing intact 9th - palate elevates symmetrically, uvula midline 11th - shoulder shrug symmetric 12th - tongue protrusion midline  MOTOR:  normal bulk and tone, full strength in the BUE, BLE  SENSORY:  normal and  symmetric to light touch  COORDINATION:  finger-nose-finger, fine finger movements normal  GAIT/STATION:  normal   DIAGNOSTIC DATA (LABS, IMAGING, TESTING) - I reviewed patient records, labs, notes, testing and imaging myself where available.  Lab Results  Component Value Date   WBC 9.0 12/06/2018   HGB 13.6 12/06/2018   HCT 42.7 12/06/2018   MCV 89.0 12/06/2018   PLT 295 12/06/2018      Component Value Date/Time   NA 134 (L) 12/06/2018 1947   K 4.0 12/06/2018 1947   CL 100 12/06/2018 1947   CO2 23 12/06/2018 1947   GLUCOSE 365 (H) 12/06/2018 1947   BUN 12 12/06/2018 1947   CREATININE 0.69 12/06/2018 1947   CREATININE 0.57 07/29/2013 1036   CALCIUM 9.1 12/06/2018 1947   PROT 7.4 12/06/2018 1947   ALBUMIN 3.7 12/06/2018 1947   AST 13 (L) 12/06/2018 1947   ALT 18 12/06/2018 1947   ALKPHOS 88 12/06/2018 1947   BILITOT 0.4 12/06/2018 1947   GFRNONAA >60 12/06/2018 1947   GFRAA >60 12/06/2018 1947   No results found for: "CHOL", "HDL", "LDLCALC", "LDLDIRECT", "TRIG", "CHOLHDL" Lab Results  Component Value Date   HGBA1C 11.8 (H) 09/17/2020   No results found for: "VITAMINB12" Lab Results  Component Value Date   TSH 1.650 01/02/2020    Outside labs  B12   380 Folate  9.9 RPR   Negative  TSH  2.54 Vit D  11    Head CT 08/29/2022 1. No intracranial abnormalities.    ASSESSMENT AND PLAN  42 y.o. year old female with multiple reported medical conditions including anxiety, depression, bipolar type I, diabetes mellitus, obesity who is presenting for management of her headaches and memory loss.  In terms of her memory, again told patient that this is likely combination of untreated anxiety, depression, bipolar disorder and she need to seek help with a psychiatrist.  She is still pending a formal neuropsychological testing. Interim of the headaches, she still having daily headaches, chronic headaches.  She is tried magnesium with some relief per patient but she did  have heartburn.  At this time it is unclear if her chronic headaches are related to IIH or migraines.  He did have a CT scan in February and per my review there was a partially empty sella.  I will obtain MRI brain for further evaluation.  If there is any signs associated with IIH, I will send patient for lumbar puncture.  Again her BMI is 57.  I will also start her on Topamax Topamax 50 mg nightly.  I will see her in 6 months for follow-up or sooner if worse.    1. Chronic nonintractable headache, unspecified headache type   2. Anxiety   3. Depression, unspecified depression type   4. Mild cognitive impairment     Patient Instructions  Discontinue Magnesium (side effect of heartburn per patient)  Start Topiramate 50 mg 1/2 tablet nightly for one week then increase to full tablet. Please contact me if you do have side effects Continue with Sumatriptan as needed for the headaches  Continue your other medications  MRI Brain to rule out IIH Follow up with psychiatry for management of anxiety/depression, bipolar disorder  Follow up with neuropsych as scheduled  Return in 6 months   Orders Placed This Encounter  Procedures   MR BRAIN W WO CONTRAST    Meds ordered this encounter  Medications   DISCONTD: amitriptyline (ELAVIL) 25 MG tablet    Sig: 1/2 tablet for one week then increase to full tablet    Dispense:  30 tablet    Refill:  6   topiramate (TOPAMAX) 50 MG tablet    Sig: 1/2 tablet for one week then increase to full tablet.    Dispense:  30 tablet    Refill:  3    Return in about 6 months (around 10/03/2023).  I have spent a total of 45 minutes dedicated to this patient today, preparing to see patient, performing a medically appropriate examination and evaluation, ordering tests and/or medications and procedures, and counseling and educating the patient/family/caregiver; independently interpreting result and communicating results to the family/patient/caregiver; and  documenting clinical information in the electronic medical record.  Windell Norfolk, MD 04/05/2023, 1:18 PM  [facility] [address], [address] [place], [place] [address] [phone]

## 2023-04-11 ENCOUNTER — Telehealth: Payer: Self-pay | Admitting: Neurology

## 2023-04-11 NOTE — Telephone Encounter (Signed)
Diamond Pace: [id number] exp. 04/11/23-05/10/23 sent to GI due to weight. [phone]

## 2023-04-13 ENCOUNTER — Encounter: Payer: Self-pay | Admitting: Nurse Practitioner

## 2023-04-13 ENCOUNTER — Ambulatory Visit (INDEPENDENT_AMBULATORY_CARE_PROVIDER_SITE_OTHER): Payer: BC Managed Care – PPO | Admitting: Nurse Practitioner

## 2023-04-13 VITALS — BP 124/70 | HR 69 | Ht 66.0 in | Wt 354.0 lb

## 2023-04-13 DIAGNOSIS — Z8619 Personal history of other infectious and parasitic diseases: Secondary | ICD-10-CM

## 2023-04-13 DIAGNOSIS — B009 Herpesviral infection, unspecified: Secondary | ICD-10-CM | POA: Diagnosis not present

## 2023-04-13 DIAGNOSIS — Z113 Encounter for screening for infections with a predominantly sexual mode of transmission: Secondary | ICD-10-CM

## 2023-04-13 DIAGNOSIS — Z01419 Encounter for gynecological examination (general) (routine) without abnormal findings: Secondary | ICD-10-CM | POA: Diagnosis not present

## 2023-04-13 NOTE — Progress Notes (Signed)
Diamond Pace 05-Dec-1980 [id number]   History:  42 y.o. [id number] presents for annual exam. Slightly irregular periods. Not new for her. 2005 ASCUS + HR HPV, normal paps since. Smoker. H/O DM, depression, HTN. Had sore on buttock in July - HSV 2 positive. Has outbreak 1-2 times per year with stress. She has questions about this regarding accuracy of test and treatment. Wants STD screening today. H/O syphilis ~ 10 years ago.   Gynecologic History Patient's last menstrual period was 03/10/2023. Period Duration (Days): 3-6 Period Pattern: (!) Irregular Menstrual Flow: Moderate Menstrual Control: Maxi pad Menstrual Control Change Freq (Hours): 6-8 Dysmenorrhea: (!) Moderate Dysmenorrhea Symptoms: Cramping Contraception/Family planning: none Sexually active: Yes  Health Maintenance Last Pap: 11/10/2021. Results were: Normal neg HPV, 5-year repeat Last mammogram: 01/20/2021 (diagnostic). Results were: Stable right breast mass, biopsy-proven fibroadenoma Last colonoscopy: Not indicated Last Dexa: Not indicated  Past medical history, past surgical history, family history and social history were all reviewed and documented in the EPIC chart. Married. 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 41 yo daughter.   ROS:  A ROS was performed and pertinent positives and negatives are included.  Exam:  Vitals:   04/13/23 1024  BP: 124/70  Pulse: 69  SpO2: 100%  Weight: (!) 354 lb (160.6 kg)  Height: 5\' 6"  (1.676 m)   Body mass index is 57.14 kg/m.  General appearance:  Normal Thyroid:  Symmetrical, normal in size, without palpable masses or nodularity. Respiratory  Auscultation:  Clear without wheezing or rhonchi Cardiovascular  Auscultation:  Regular rate, without rubs, murmurs or gallops  Edema/varicosities:  Not grossly evident Abdominal  Soft,nontender, without masses, guarding or rebound.  Liver/spleen:  No organomegaly noted  Hernia:   None appreciated  Skin  Inspection:  Grossly normal Breasts: Examined lying and sitting.   Right: ~5 mm, oval mass in outer right breast (c/w previous fibroadenoma seen on imaging). No retractions, nipple discharge or axillary adenopathy.   Left: Without masses, retractions, nipple discharge or axillary adenopathy. Pelvic: External genitalia:  no lesions              Urethra:  normal appearing urethra with no masses, tenderness or lesions              Bartholins and Skenes: normal                 Vagina: normal appearing vagina with normal color and discharge, no lesions              Cervix: no lesions Bimanual Exam:  Uterus:  Difficult to palpate due to body habitus but no gross masses or tenderness              Adnexa: no mass, fullness, tenderness              Rectovaginal: Deferred              Anus:  normal, no lesions   Patient informed chaperone available to be present for breast and pelvic exam. Patient has requested no chaperone to be present. Patient has been advised what will be completed during breast and pelvic exam.   Assessment/Plan:  42 y.o. [id number] for annual exam.   Well female exam with routine gynecological exam - Education provided on SBEs, importance of preventative screenings, current guidelines, high calcium diet, regular exercise, and multivitamin daily. Labs with PCP.   Screening examination for STD (sexually transmitted disease) - Plan: SURESWAB CT/NG/T. vaginalis,  RPR, HIV Antibody (routine testing w rflx)  HSV-2 (herpes simplex virus 2) infection - reviewed results and accuracy of culture. Educated on use of Valtrex. Take a first sign of outbreak BID x 3-5 days.   H/O syphilis - ~ 10 years ago  Screening for cervical cancer - Normal Pap history.  Will repeat at 5-year interval per guidelines.  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 Discussed current guidelines and importance of preventative screenings. Information provided on  [facility].  Normal breast exam today.  Return in 1 year for annual or sooner if needed.     Olivia Mackie DNP, 10:57 AM 04/13/2023

## 2023-04-14 LAB — SURESWAB CT/NG/T. VAGINALIS
C. trachomatis RNA, TMA: NOT DETECTED
N. gonorrhoeae RNA, TMA: NOT DETECTED
Trichomonas vaginalis RNA: NOT DETECTED

## 2023-04-14 LAB — RPR: RPR Ser Ql: NONREACTIVE

## 2023-04-14 LAB — HIV ANTIBODY (ROUTINE TESTING W REFLEX): HIV 1&2 Ab, 4th Generation: NONREACTIVE

## 2023-05-11 NOTE — Telephone Encounter (Signed)
Diamond Pace: [id number] exp. 05/11/23-06/09/23

## 2023-05-15 ENCOUNTER — Ambulatory Visit
Admission: RE | Admit: 2023-05-15 | Discharge: 2023-05-15 | Disposition: A | Discharge status: Home / Self Care | Payer: BC Managed Care – PPO | Source: Ambulatory Visit | Attending: Neurology

## 2023-05-15 DIAGNOSIS — R519 Headache, unspecified: Secondary | ICD-10-CM

## 2023-05-15 DIAGNOSIS — G8929 Other chronic pain: Secondary | ICD-10-CM | POA: Diagnosis not present

## 2023-05-15 MED ORDER — GADOPICLENOL 0.5 MMOL/ML IV SOLN
10.0000 mL | Freq: Once | INTRAVENOUS | Status: AC | PRN
  Administered 2023-05-15: 10 mL via INTRAVENOUS

## 2023-05-16 ENCOUNTER — Encounter: Payer: Self-pay | Admitting: Neurology

## 2023-05-31 IMAGING — MG MM BREAST LOCALIZATION CLIP
4 series · 4 of 12 positions shown · non-contrast
Comparison: Previous exam(s).

CLINICAL DATA: 39-year-old female status post stereotactic biopsy
of the right breast.

EXAM:
3D DIAGNOSTIC RIGHT MAMMOGRAM POST STEREOTACTIC BIOPSY

[R CC synth-2D]
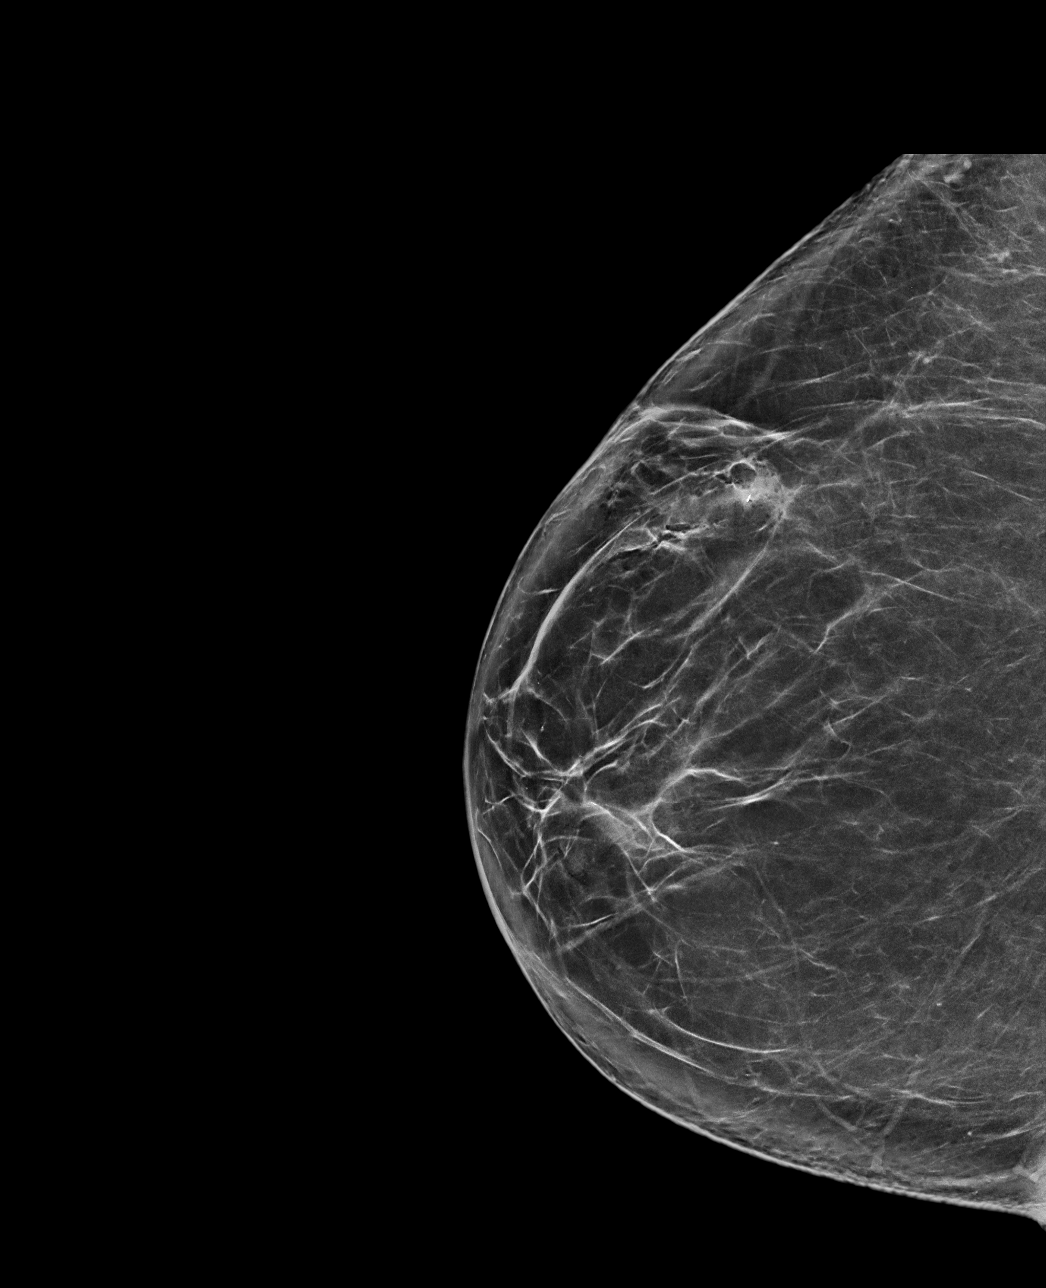

[R ML synth-2D]
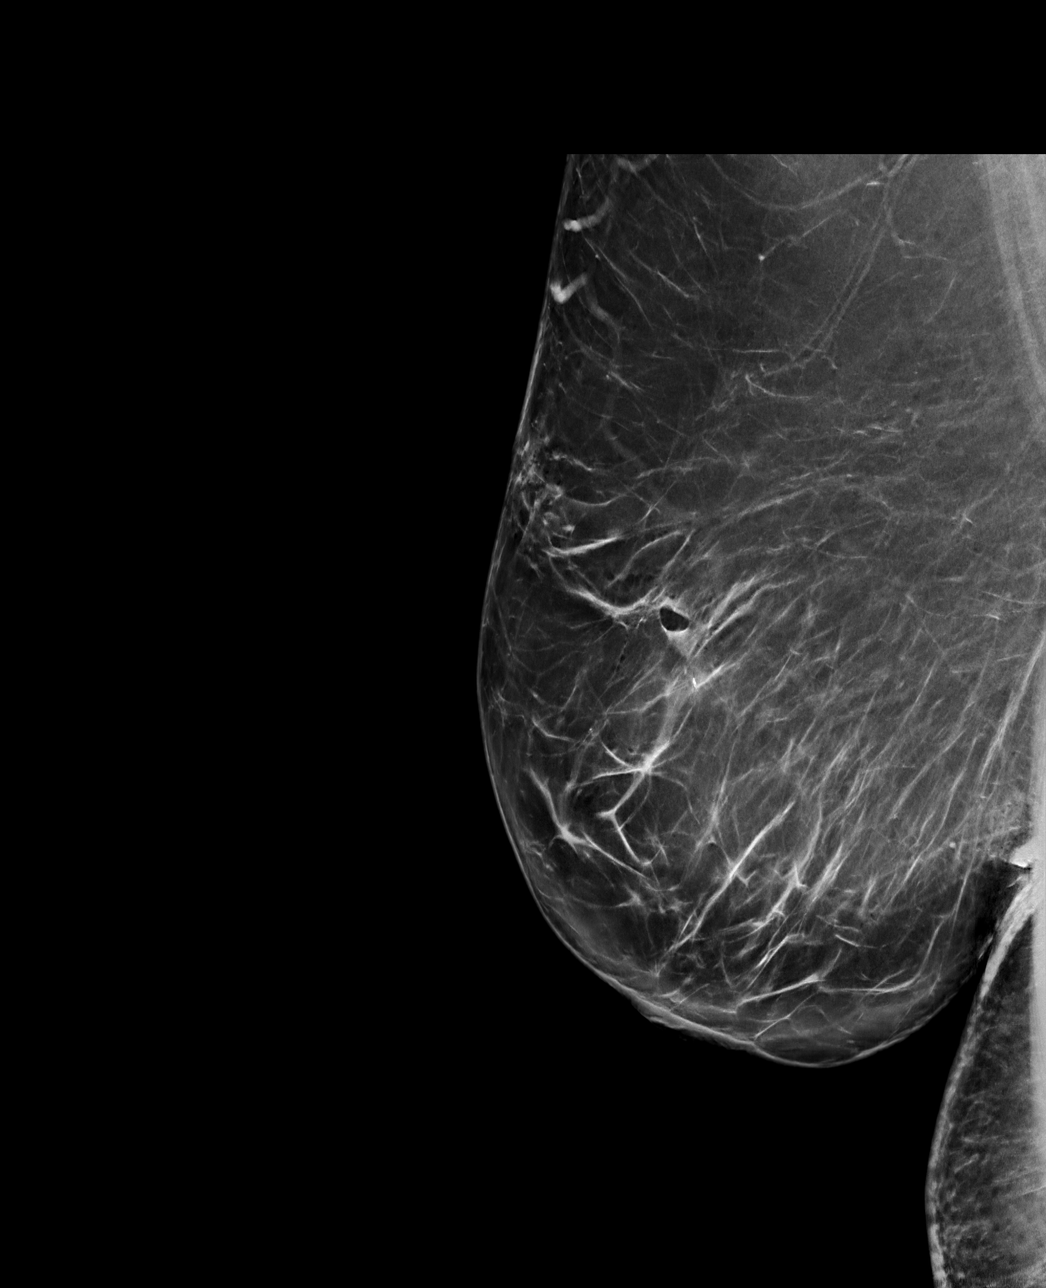

[R CC tomo · tomo slice 44/87.0]
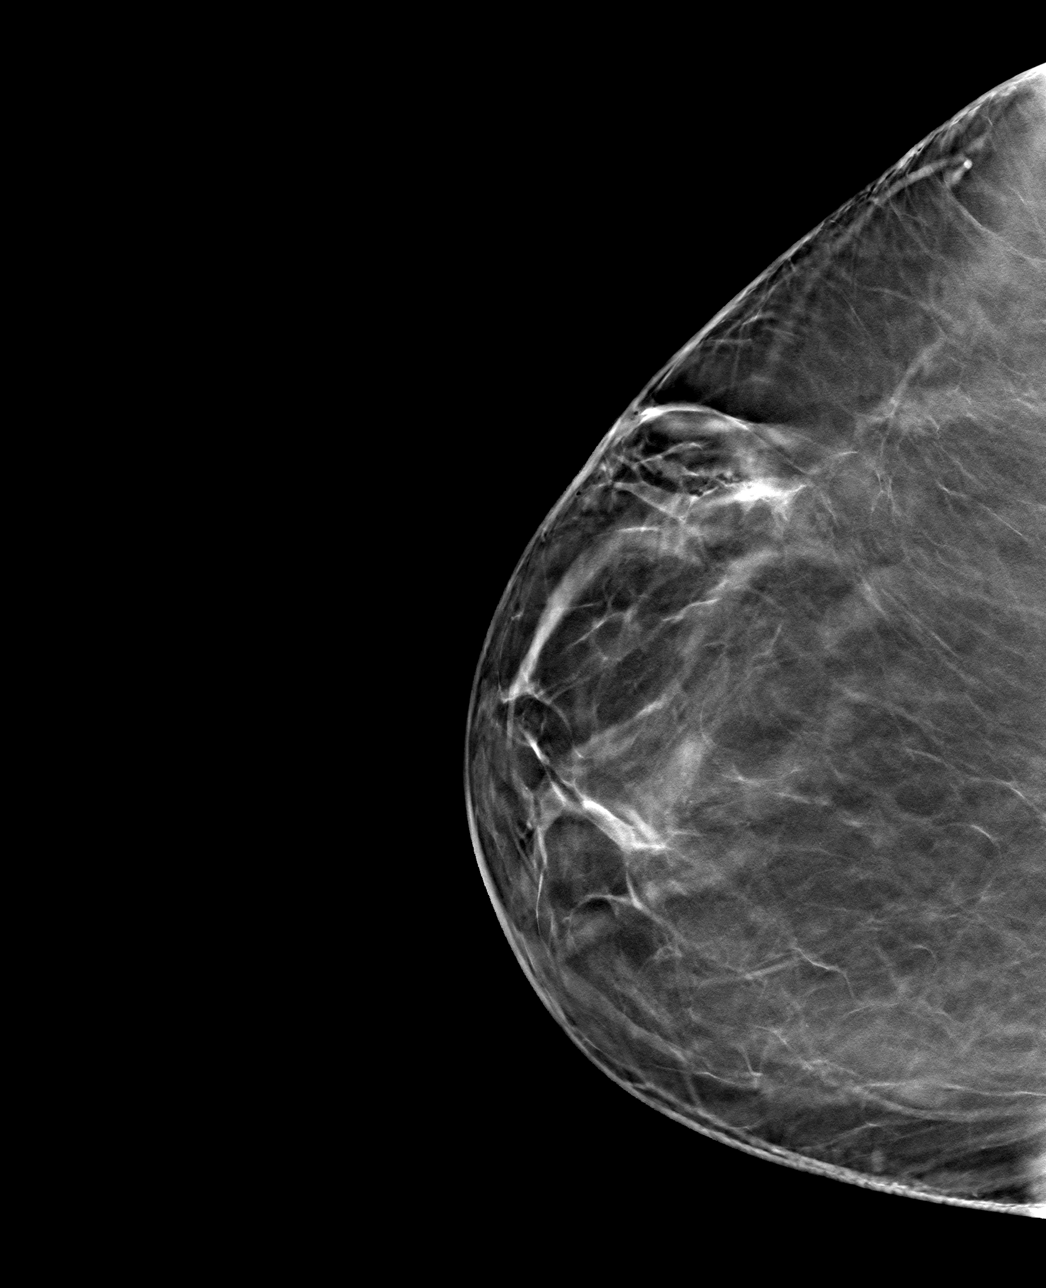

[R ML tomo · tomo slice 50/99.0]
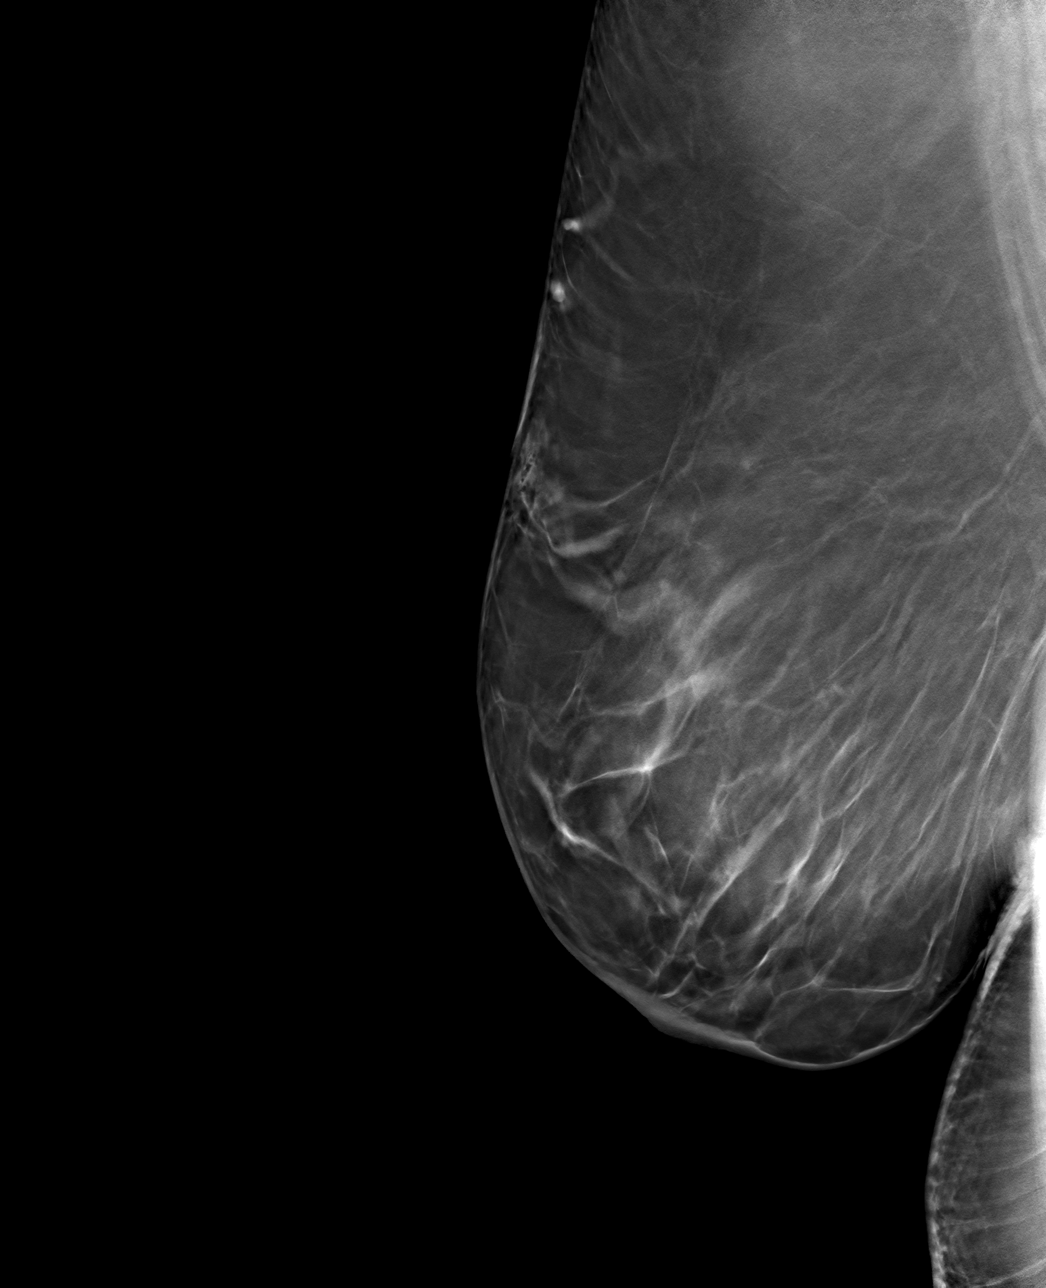

[4 of 12 positions shown; findings below may reference images not displayed]

FINDINGS: 3D Mammographic images were obtained following stereotactic guided
biopsy of the right breast. The biopsy marking clip is in expected
position at the site of biopsy.
IMPRESSION: Appropriate positioning of the X shaped biopsy marking clip at the
site of biopsy in the upper-outer right breast.

Final Assessment: Post Procedure Mammograms for Marker Placement

## 2023-06-13 ENCOUNTER — Encounter: Payer: Self-pay | Admitting: Nurse Practitioner

## 2023-06-15 ENCOUNTER — Other Ambulatory Visit: Payer: Self-pay | Admitting: Nurse Practitioner

## 2023-06-15 DIAGNOSIS — N912 Amenorrhea, unspecified: Secondary | ICD-10-CM

## 2023-06-15 MED ORDER — MEDROXYPROGESTERONE ACETATE 10 MG PO TABS
10.0000 mg | ORAL_TABLET | Freq: Every day | ORAL | 0 refills | Status: DC
Start: 2023-06-15 — End: 2023-10-17

## 2023-06-15 NOTE — Telephone Encounter (Signed)
Last visit 04/13/2023. LMP reported on that date: 03/10/2023

## 2023-10-17 ENCOUNTER — Encounter: Payer: Self-pay | Admitting: Neurology

## 2023-10-17 ENCOUNTER — Ambulatory Visit: Payer: BC Managed Care – PPO | Admitting: Neurology

## 2023-10-17 VITALS — BP 135/89 | HR 90 | Ht 65.0 in | Wt 334.0 lb

## 2023-10-17 DIAGNOSIS — F32A Depression, unspecified: Secondary | ICD-10-CM | POA: Diagnosis not present

## 2023-10-17 DIAGNOSIS — F419 Anxiety disorder, unspecified: Secondary | ICD-10-CM

## 2023-10-17 DIAGNOSIS — R519 Headache, unspecified: Secondary | ICD-10-CM | POA: Diagnosis not present

## 2023-10-17 DIAGNOSIS — G8929 Other chronic pain: Secondary | ICD-10-CM

## 2023-10-17 MED ORDER — SUMATRIPTAN SUCCINATE 50 MG PO TABS: 50.0000 mg | ORAL_TABLET | ORAL | 11 refills | Status: AC | PRN

## 2023-10-17 MED ORDER — AMITRIPTYLINE HCL 25 MG PO TABS: 25.0000 mg | ORAL_TABLET | Freq: Every day | ORAL | 6 refills | Status: DC

## 2023-10-17 NOTE — Progress Notes (Signed)
 [facility]  PATIENT: Diamond Pace DOB: [birth year]  REQUESTING CLINICIAN: Verlon Au, MD HISTORY FROM: Patient  REASON FOR VISIT: Memory problems, headaches    HISTORICAL  CHIEF COMPLAINT:  Chief Complaint  Patient presents with   Follow-up    Pt in 13, here with daughter [name] Pt is here following up on migraines and memory.    INTERVAL HISTORY 10/17/2023:  Patient presents today for follow-up, she is accompanied by her daughter.  He last visit was in September.  She tells me that during the winter months, with the cold weather she was doing well in terms of the headaches, did not have to use a lot of sumatriptan.  She discontinued both amitriptyline and topiramate and was only using sumatriptan as needed.  She tells me lately she noted an increase in her headache frequency and fells like is related to warm temperature and allergies season.  In term of her memory complaint, she was told by PCP that is related to her anxiety and depression depression, she tells me that she is considering seeing a psychiatrist or therapist but have not done so.   INTERVAL HISTORY 04/05/2023:  Patient presents today for follow-up, she tells me that she does not remember meeting me in March.  In terms of his her memory, she is still reporting the same symptoms.  She has not followed up with psychiatry and has not had her neuropsych evaluation completed. When it comes to the headaches, she continued to have daily headaches.  She reports the week that she was on magnesium, her headache seems to get better but she did develop worsening heartburn therefore she discontinued her magnesium.  She did not call the office.  Currently she is not on any preventive medication but states that the sumatriptan does help ease the headaches.  She continued to have headaches, reports daily headaches, with varying intensities.   HISTORY OF PRESENT ILLNESS:  This is a 43 year old  woman with multiple reported medical conditions including anxiety, depression, bipolar 1 with manic episodes, currently not on any medications, diabetes, obesity who is presenting with memory problems going on for the past few years.  Patient described memory problem as being forgetful, she is forgetting names, forgetting faces. The other day she reported talking to her brother over the phone, initially she did not recognize it was her brother then she reported she was not aware of her brother spoke spanish and lastly she reported she was not aware that her brother moved to [place].  She does report recent trip in [place] that she does not remember.  She reported 2 years ago she forgot to pick up her daughter at school. Patient does tell me that she forgot she is diabetic, forgets to take her meds and now she has write everything down, put everything on her phone as alarm or else she will forget.  She is still able to drive, denies any recent accident but will get loss going to familiar places.  When going shopping she will spend extra additional time looking for items, because she forget to wrote them down. She denies any family history of dementia but report currently experiencing anxiety, depression, has a history of bipolar.  Currently she is not taking any medication because the previous medications have given her gastroparesis.  She is not getting therapy but she had does have a psychiatrist   Patient also complains of headaches, right-sided headache described as cramping squeezing sometimes entire head.  On average in  a week she will have 6 days of headaches, sometimes she will go to sleep with a headache and wake up with a headache.  Headaches are mainly right sided with light sensitivity, no nausea, no vomiting. At most she will try Tylenol but otherwise has not tried any other medications and has not been on a preventive medicine.    TBI:   Larey Seat as a child and landed on cement  Stroke:   no past  history of stroke Seizures:   no past history of seizures Sleep:   no history of sleep apnea.   Mood: Yes, diagnosed with anxiety and depression Family history of Dementia:   Denies  Functional status: independent in most ADLs and IADLs Patient lives with husband, and daughter and 2 step sons. Cooking: yes, but will forget she is cooking and burn the food  Cleaning: yes Shopping: Yes, but will stay longer but will forgot what she needs  Bathing: no help needed  Toileting: no help needed  Driving: Yes, but has been lost while driving  Bills: Been late on her bills because she forget to pay them  Medications: Yes, sometime will forget to pay her meds  Ever left the stove on by accident?: Yes  Forget how to use items around the house?: No  Getting lost going to familiar places?: yes Forgetting loved ones names?: Yes  Word finding difficulty? Yes  Sleep: Good    OTHER MEDICAL CONDITIONS: Anxiety, Depression, Bipolar I with manic, (not on any meds), Diabetes, Obesity    REVIEW OF SYSTEMS: Full 14 system review of systems performed and negative with exception of: As noted in the HPI   ALLERGIES: Allergies  Allergen Reactions   Neosporin [Neomycin-Polymyxin-Gramicidin] Dermatitis and Rash    HOME MEDICATIONS: Outpatient Medications Prior to Visit  Medication Sig Dispense Refill   ACCU-CHEK FASTCLIX LANCETS MISC 1 Units by Percutaneous route 4 (four) times daily. 100 each 12   atorvastatin (LIPITOR) 10 MG tablet Take 10 mg by mouth daily.     azelastine (OPTIVAR) 0.05 % ophthalmic solution Use as directed as needed for allergies     betamethasone valerate ointment (VALISONE) 0.1 % Apply 1 application. topically 2 (two) times daily. Use for up to 2 weeks as needed 30 g 0   Continuous Blood Gluc Sensor (FREESTYLE LIBRE 3 SENSOR) MISC SMARTSIG:1 SUB-Q Every 2 Weeks     dapagliflozin propanediol (FARXIGA) 10 MG TABS tablet Take 10 mg by mouth daily.     Dapagliflozin-metFORMIN HCl ER  11-998 MG TB24 Take by mouth.     Fexofenadine HCl (ALLEGRA PO) Take 1 tablet by mouth daily.     FIASP FLEXTOUCH 100 UNIT/ML FlexTouch Pen Inject into the skin 3 (three) times daily as needed.     glucose blood (BAYER CONTOUR TEST) test strip Check blood sugar level 4 times daily AM and 2 hours PP 100 each 12   hyoscyamine (LEVSIN SL) 0.125 MG SL tablet DISSOLVE 2 TABLETS IN MOUTH EVERY 4 HOURS AS NEEDED UP TO FOR 30 DAYS FOR CRAMPING     Insulin Degludec (TRESIBA FLEXTOUCH [place]) Inject into the skin. 60 units     metFORMIN (GLUCOPHAGE-XR) 500 MG 24 hr tablet Take 1,000 mg by mouth 2 (two) times daily with a meal.     montelukast (SINGULAIR) 10 MG tablet Take 10 mg by mouth at bedtime.     pioglitazone (ACTOS) 30 MG tablet Take 1 tablet once a day for diabetes     Vitamin D,  Ergocalciferol, (DRISDOL) 1.25 MG (50000 UNIT) CAPS capsule Take 50,000 Units by mouth once a week.     amitriptyline (ELAVIL) 25 MG tablet Take 25 mg by mouth.     SUMAtriptan (IMITREX) 50 MG tablet Take 1 tablet (50 mg total) by mouth every 2 (two) hours as needed for migraine. May repeat in 2 hours if headache persists or recurs. 10 tablet 11   medroxyPROGESTERone (PROVERA) 10 MG tablet Take 1 tablet (10 mg total) by mouth daily. (Patient not taking: Reported on 10/17/2023) 10 tablet 0   topiramate (TOPAMAX) 50 MG tablet 1/2 tablet for one week then increase to full tablet. (Patient not taking: Reported on 10/17/2023) 30 tablet 3   No facility-administered medications prior to visit.    PAST MEDICAL HISTORY: Past Medical History:  Diagnosis Date   Abnormal Pap smear 2005   ASCUS +HPV   Anxiety    Anxiety and depression    Depression    Diabetes mellitus without complication (HCC)    Genital warts    Hypertension    Manic bipolar I disorder (HCC)    Syphilis in female     PAST SURGICAL HISTORY: Past Surgical History:  Procedure Laterality Date   CHOLECYSTECTOMY  6/10   with liver infection, also occluded bile  duct    FAMILY HISTORY: Family History  Problem Relation Age of Onset   Cancer Father        lung   Hypertension Father    Stroke Father    Heart disease Father    Hypertension Mother    Diabetes Paternal Grandmother    Cancer Paternal Grandfather        prostate    SOCIAL HISTORY: Social History   Socioeconomic History   Marital status: Married    Spouse name: Not on file   Number of children: Not on file   Years of education: Not on file   Highest education level: Not on file  Occupational History   Not on file  Tobacco Use   Smoking status: Former    Current packs/day: 0.00    Types: Cigarettes    Quit date: 06/07/2020    Years since quitting: 3.3   Smokeless tobacco: Never   Tobacco comments:    less than 1/2 a pack a day  Substance and Sexual Activity   Alcohol use: Not Currently    Alcohol/week: 0.0 standard drinks of alcohol   Drug use: No   Sexual activity: Yes    Partners: Male    Birth control/protection: None  Other Topics Concern   Not on file  Social History Narrative   Right handed   Caffeine-10-12 cups daily   Social Drivers of Health   Financial Resource Strain: Low Risk  (08/02/2021)   [facility] prior to 09/24/2022., [facility] visits prior to 09/24/2022.   Overall Financial Resource Strain (CARDIA)    Difficulty of Paying Living Expenses: Not very hard  Food Insecurity: Low Risk  (10/25/2022)   [facility]   Hunger Vital Sign    Worried About Running Out of Food in the Last Year: Never true    Ran Out of Food in the Last Year: Never true  Transportation Needs: Not on file (10/25/2022)  Physical Activity: Inactive (08/02/2021)   Received from [facility] visits prior to 09/24/2022., [facility] visits prior to 09/24/2022.   Exercise Vital Sign  Days of Exercise per Week: 0 days    Minutes of Exercise per Session: 0 min   Stress: Stress Concern Present (08/02/2021)   [facility] visits prior to 09/24/2022., [facility] visits prior to 09/24/2022.   [employer] of Occupational Health - Occupational Stress Questionnaire    Feeling of Stress : Rather much  Social Connections: Moderately Isolated (08/02/2021)   Received from [facility] visits prior to 09/24/2022., [facility] visits prior to 09/24/2022.   Social Connection and Isolation Panel [NHANES]    Frequency of Communication with Friends and Family: Three times a week    Frequency of Social Gatherings with Friends and Family: Once a week    Attends Religious Services: Never    Database administrator or Organizations: No    Attends Banker Meetings: Never    Marital Status: Married  Catering manager Violence: Not At Risk (08/02/2021)   [facility] visits prior to 09/24/2022., [facility] visits prior to 09/24/2022.   Humiliation, Afraid, Rape, and Kick questionnaire    Fear of Current or Ex-Partner: No    Emotionally Abused: No    Physically Abused: No    Sexually Abused: No    PHYSICAL EXAM  GENERAL EXAM/CONSTITUTIONAL: Vitals:  Vitals:   10/17/23 1029  BP: 135/89  Pulse: 90  Weight: (!) 334 lb (151.5 kg)  Height: 5\' 5"  (1.651 m)   Body mass index is 55.58 kg/m. Wt Readings from Last 3 Encounters:  10/17/23 (!) 334 lb (151.5 kg)  04/13/23 (!) 354 lb (160.6 kg)  04/05/23 (!) 355 lb (161 kg)   Patient is in no distress; well developed, nourished and groomed; neck is supple. Obese woman   MUSCULOSKELETAL: Gait, strength, tone, movements noted in Neurologic exam below  NEUROLOGIC: MENTAL STATUS:      No data to display         awake, alert, oriented to person, place and time language fluent, comprehension intact, naming intact fund of knowledge appropriate    10/17/2023    10:31 AM 10/03/2022    9:05 AM  Montreal Cognitive Assessment   Visuospatial/ Executive (0/5) 3 3  Naming (0/3) 3 3  Attention: Read list of digits (0/2) 2 2  Attention: Read list of letters (0/1) 1 1  Attention: Serial 7 subtraction starting at 100 (0/3) 3 3  Language: Repeat phrase (0/2) 2 0  Language : Fluency (0/1) 1 0  Abstraction (0/2) 2 2  Delayed Recall (0/5) 3 3  Orientation (0/6) 6 5  Total 26 22    CRANIAL NERVE:  2nd, 3rd, 4th, 6th- visual fields full to confrontation, extraocular muscles intact, no nystagmus 5th - facial sensation symmetric 7th - facial strength symmetric 8th - hearing intact 9th - palate elevates symmetrically, uvula midline 11th - shoulder shrug symmetric 12th - tongue protrusion midline  MOTOR:  normal bulk and tone, full strength in the BUE, BLE  SENSORY:  normal and symmetric to light touch  COORDINATION:  finger-nose-finger, fine finger movements normal  GAIT/STATION:  normal   DIAGNOSTIC DATA (LABS, IMAGING, TESTING) - I reviewed patient records, labs, notes, testing and imaging myself where available.  Lab Results  Component Value Date   WBC 9.0 12/06/2018   HGB 13.6 12/06/2018   HCT 42.7 12/06/2018   MCV 89.0 12/06/2018   PLT 295 12/06/2018  Component Value Date/Time   NA 134 (L) 12/06/2018 1947   K 4.0 12/06/2018 1947   CL 100 12/06/2018 1947   CO2 23 12/06/2018 1947   GLUCOSE 365 (H) 12/06/2018 1947   BUN 12 12/06/2018 1947   CREATININE 0.69 12/06/2018 1947   CREATININE 0.57 07/29/2013 1036   CALCIUM 9.1 12/06/2018 1947   PROT 7.4 12/06/2018 1947   ALBUMIN 3.7 12/06/2018 1947   AST 13 (L) 12/06/2018 1947   ALT 18 12/06/2018 1947   ALKPHOS 88 12/06/2018 1947   BILITOT 0.4 12/06/2018 1947   GFRNONAA >60 12/06/2018 1947   GFRAA >60 12/06/2018 1947   No results found for: "CHOL", "HDL", "LDLCALC", "LDLDIRECT", "TRIG", "CHOLHDL" Lab Results  Component Value Date   HGBA1C 11.8 (H) 09/17/2020   No  results found for: "VITAMINB12" Lab Results  Component Value Date   TSH 1.650 01/02/2020    Outside labs  B12   380 Folate  9.9 RPR   Negative  TSH  2.54 Vit D  11    Head CT 08/29/2022 1. No intracranial abnormalities.    MRI Brain 05/15/2023 This MRI of the brain with and without contrast shows the following: Few punctate T2/FLAIR hyperintense foci in the subcortical white matter.  This is a nonspecific finding and could be due to sequela of migraine headaches or minimal chronic microvascular ischemic changes.  None of these appear to be acute.  They do not enhance. Mild right mastoid effusion.  This might be due to eustachian tube dysfunction. Normal enhancement pattern.  No acute findings.   ASSESSMENT AND PLAN  43 y.o. year old female with multiple reported medical conditions including anxiety, depression, bipolar type I, diabetes mellitus, obesity who is presenting for management of her headaches and memory loss.  In terms of her memory, again told patient that this is likely combination of untreated anxiety, depression, bipolar disorder and she need to seek help with a psychiatrist. She voiced understanding and states that she is going to follow up with PCP for referrals.   In term of the headaches, we will restarted her on amitriptyline 25 mg, half tablet nightly for 1 week then increase to full tablet.  Advised patient to contact me for headache frequency get worse.  I did also advise her to control her allergies and I will see her in 6 months for follow-up.   1. Chronic nonintractable headache, unspecified headache type   2. Depression, unspecified depression type   3. Anxiety     Patient Instructions  Start Amitriptyline 25 mg, 1/2 tablet nightly for one week then increase to full tablet  Continue with Sumatriptan as scheduled  Continue to follow up with PCP  Consider setting up care with Psychiatry or Psychology  Return in 6 months or sooner if worse   No orders  of the defined types were placed in this encounter.   Meds ordered this encounter  Medications   amitriptyline (ELAVIL) 25 MG tablet    Sig: Take 1 tablet (25 mg total) by mouth at bedtime.    Dispense:  30 tablet    Refill:  6   SUMAtriptan (IMITREX) 50 MG tablet    Sig: Take 1 tablet (50 mg total) by mouth every 2 (two) hours as needed for migraine. May repeat in 2 hours if headache persists or recurs.    Dispense:  10 tablet    Refill:  11    Return in about 6 months (around 04/18/2024).   Windell Norfolk, MD  10/17/2023, 12:41 PM  [facility] [address], [address] [place], [place] [address] [phone]

## 2023-10-17 NOTE — Patient Instructions (Signed)
 Start Amitriptyline 25 mg, 1/2 tablet nightly for one week then increase to full tablet  Continue with Sumatriptan as scheduled  Continue to follow up with PCP  Consider setting up care with Psychiatry or Psychology  Return in 6 months or sooner if worse

## 2023-10-20 ENCOUNTER — Other Ambulatory Visit: Payer: Self-pay | Admitting: Family Medicine

## 2023-10-20 DIAGNOSIS — Z1231 Encounter for screening mammogram for malignant neoplasm of breast: Secondary | ICD-10-CM

## 2023-12-05 ENCOUNTER — Ambulatory Visit
Admission: RE | Admit: 2023-12-05 | Discharge: 2023-12-05 | Disposition: A | Discharge status: Home / Self Care | Source: Ambulatory Visit | Attending: Family Medicine

## 2023-12-05 DIAGNOSIS — Z1231 Encounter for screening mammogram for malignant neoplasm of breast: Secondary | ICD-10-CM

## 2024-04-24 ENCOUNTER — Encounter: Payer: Self-pay | Admitting: Nurse Practitioner

## 2024-04-24 ENCOUNTER — Telehealth: Payer: Self-pay | Admitting: Neurology

## 2024-04-24 ENCOUNTER — Telehealth: Payer: Self-pay | Admitting: *Deleted

## 2024-04-24 ENCOUNTER — Ambulatory Visit: Admitting: Nurse Practitioner

## 2024-04-24 VITALS — BP 118/80 | HR 70 | Resp 16

## 2024-04-24 DIAGNOSIS — B9689 Other specified bacterial agents as the cause of diseases classified elsewhere: Secondary | ICD-10-CM

## 2024-04-24 DIAGNOSIS — N760 Acute vaginitis: Secondary | ICD-10-CM | POA: Diagnosis not present

## 2024-04-24 LAB — WET PREP FOR TRICH, YEAST, CLUE

## 2024-04-24 MED ORDER — METRONIDAZOLE 500 MG PO TABS: 500.0000 mg | ORAL_TABLET | Freq: Two times a day (BID) | ORAL | 0 refills | Status: DC

## 2024-04-24 MED ORDER — FLUCONAZOLE 150 MG PO TABS: 150.0000 mg | ORAL_TABLET | ORAL | 0 refills | Status: DC

## 2024-04-24 NOTE — Telephone Encounter (Signed)
 Patient notified. Encounter closed

## 2024-04-24 NOTE — Telephone Encounter (Signed)
 Spoke with patient. Patient reports she was treated by urgent care for for Thrush 8 days ago, treated with Nystatin  suspension 4-5 days.   Reports flat bumps inside vagina, itchy with white reside. Noticed last night about 10:30PM. Vagina is irritated and red. Symptoms extend to rectum.   Denies urinary symptoms, bleeding, fever/chills. No rash or any other areas of the body.   States she stopped Nystatin , has tried to reach out to Urgent Care for alternative to Nystatin , as she was uncertain if the Nystatin  was contributing to the symptoms. No return call from UC per patient.   Advised I will provide update to Tiffany, I will return call to advise if OV appropriate at [facility] or Urgent Care, patient agreeable.  Routing to Tiffany to review and advise.

## 2024-04-24 NOTE — Progress Notes (Signed)
   Acute Office Visit  Subjective:    Patient ID: Diamond Pace, female    DOB: [birth year], 43 y.o.   MRN: [id number]   HPI 43 y.o. presents today for vaginal burning and itching x 1 week. Feels like she has bumps on inside of vagina. Some white discharge. Treated for oral thrush at [facility] 04/14/24. Used oral nystatin  for 4-5 days. Still having symptoms but wasn't sure if she should continue. Reports getting steroid shot for allergies and blood sugars were hard to control prior to all this.   No LMP recorded. Period Duration (Days): 5 Period Pattern: (!) Irregular Menstrual Flow: Moderate Menstrual Control: Maxi pad Dysmenorrhea: (!) Mild Dysmenorrhea Symptoms: Cramping  Review of Systems  Constitutional: Negative.   HENT:  Positive for mouth sores.   Genitourinary:  Positive for genital sores, vaginal discharge and vaginal pain (Itching, burning). Frequency: Bumps.      Objective:    Physical Exam Constitutional:      Appearance: Normal appearance.  Genitourinary:    Vagina: Vaginal discharge present. No erythema.     Cervix: Normal.     Comments: Generalized external redness    BP 118/80   Pulse 70   Resp 16  Wt Readings from Last 3 Encounters:  10/17/23 (!) 334 lb (151.5 kg)  04/13/23 (!) 354 lb (160.6 kg)  04/05/23 (!) 355 lb (161 kg)        [name], CMA present as Biomedical engineer.   Wet prep + clue cells (+ odor)  Assessment & Plan:   Problem List Items Addressed This Visit   None Visit Diagnoses       Acute vaginitis    -  Primary   Relevant Medications   fluconazole  (DIFLUCAN ) 150 MG tablet   Other Relevant Orders   WET PREP FOR TRICH, YEAST, CLUE     Bacterial vaginosis       Relevant Medications   metroNIDAZOLE  (FLAGYL ) 500 MG tablet         Plan: Wet prep positive for BV - Flagyl  500 mg BID x 7 days. Diflucan  #2 provided to take at end of antibiotic course for prevention. Recommend restarting oral nystatin  for thrush for full 10 days  as recommended.   Return if symptoms worsen or fail to improve.    Diamond DELENA Shutter DNP, 12:31 PM 04/24/2024

## 2024-04-24 NOTE — Telephone Encounter (Signed)
 Pt called to cancel appt due to feeling better   Appt canceled

## 2024-04-24 NOTE — Telephone Encounter (Signed)
 Ok to OV appt for today for vaginal symptoms.

## 2024-05-02 ENCOUNTER — Ambulatory Visit: Admitting: Neurology

## 2024-05-10 ENCOUNTER — Encounter: Payer: Self-pay | Admitting: Nurse Practitioner

## 2024-05-10 MED ORDER — METRONIDAZOLE 0.75 % VA GEL: 1.0000 | Freq: Every day | VAGINAL | 0 refills | Status: AC

## 2024-05-10 NOTE — Addendum Note (Signed)
 Addended by: BRUTUS KATE SAILOR on: 05/10/2024 01:16 PM   Modules accepted: Orders

## 2024-05-10 NOTE — Telephone Encounter (Signed)
 Spoke with patient. Patient tx for BV on 10/1 with flagyl , followed by diflcuan and then oral nystatin  for thrush. Patient reports symptoms resolved. Symptoms returned on 05/08/24.   Patient reports internal and external vaginal itching, burning and white/gray vaginal d/c, no odor. Has been using OTC monistat, helps itching, burning is worse.   Advised OV recommended for further evaluation, patient declines OV today. Scheduled with Dr. Dallie for 10/20 at 1400. Patient will try coconut oil in the meantime for relief.  Routing to provider for final review. Patient is agreeable to disposition. Will close encounter.  Cc: Dr. Dallie

## 2024-05-10 NOTE — Telephone Encounter (Signed)
 Patient notified. Rx sent. OV canceled.   Encounter closed.

## 2024-05-13 ENCOUNTER — Ambulatory Visit: Admitting: Obstetrics and Gynecology

## 2024-06-03 ENCOUNTER — Ambulatory Visit: Admitting: Obstetrics and Gynecology

## 2024-06-03 ENCOUNTER — Encounter: Payer: Self-pay | Admitting: Obstetrics and Gynecology

## 2024-06-03 VITALS — BP 120/74 | HR 95

## 2024-06-03 DIAGNOSIS — B379 Candidiasis, unspecified: Secondary | ICD-10-CM | POA: Diagnosis not present

## 2024-06-03 DIAGNOSIS — N760 Acute vaginitis: Secondary | ICD-10-CM | POA: Diagnosis not present

## 2024-06-03 DIAGNOSIS — E1165 Type 2 diabetes mellitus with hyperglycemia: Secondary | ICD-10-CM | POA: Diagnosis not present

## 2024-06-03 DIAGNOSIS — B9689 Other specified bacterial agents as the cause of diseases classified elsewhere: Secondary | ICD-10-CM

## 2024-06-03 LAB — WET PREP FOR TRICH, YEAST, CLUE

## 2024-06-03 MED ORDER — CLINDAMYCIN PHOSPHATE 2 % VA CREA: 1.0000 | TOPICAL_CREAM | Freq: Every day | VAGINAL | 0 refills | Status: DC

## 2024-06-03 MED ORDER — NYSTATIN 100000 UNIT/GM EX OINT: 1.0000 | TOPICAL_OINTMENT | Freq: Two times a day (BID) | CUTANEOUS | 0 refills | Status: DC

## 2024-06-03 MED ORDER — TRIAMCINOLONE ACETONIDE 0.025 % EX OINT: 1.0000 | TOPICAL_OINTMENT | Freq: Two times a day (BID) | CUTANEOUS | 0 refills | Status: DC

## 2024-06-03 MED ORDER — FLUCONAZOLE 150 MG PO TABS: ORAL_TABLET | ORAL | 0 refills | Status: DC

## 2024-06-03 NOTE — Patient Instructions (Signed)

## 2024-06-03 NOTE — Progress Notes (Signed)
 GYNECOLOGY  VISIT   HPI: 43 y.o.   Married  Hispanic female   [phone] with Patient's last menstrual period was 05/12/2024 (exact date).   here for: Bacterial vaginosis, has been treated twice since October for BV. Itching, clumpy discharge, odor, discomfort at urethra and rectum.    Itching is waking her up at night.   Feels like she has a rash near her anus.    Has a clumpy discharge.  Wet prep 04/24/24:  clue cells. Treated with Flagyl  and Diflucan .  In mid October treated by phone with Metrogel .    A1C 14.5 on 05/09/24, estimated average glucose 369.  Cr 0.57 on 05/09/24.  She is waiting on a referral to [facility] endocrinology.   GYNECOLOGIC HISTORY: Patient's last menstrual period was 05/12/2024 (exact date). 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   Throat irritation 10/20/2017   Mixed dyslipidemia 11/01/2016   Severe obesity (BMI >= 40) (HCC) 11/01/2016   Smoking 11/01/2016   Vitamin D deficiency 11/01/2016   Atopic dermatitis 03/26/2014   Anxiety state 08/16/2013   Fetal macrosomia during pregnancy in third trimester 07/11/2013   Benign essential hypertension, antepartum 06/24/2013   Hypersomnia 06/24/2013   Diabetes mellitus during pregnancy, antepartum 02/05/2013   Obesity complicating peripregnancy, antepartum 02/05/2013   Pregnancy 01/29/2013   Esophageal reflux 01/04/2013   Hypertension, essential 01/01/2013   Type 2 diabetes mellitus with hyperglycemia, with long-term current use of insulin (HCC) 01/01/2013   Pregnancy  with history of ectopic pregnancy in first trimester 01/01/2013   Depressive disorder 10/03/2012   Venous (peripheral) insufficiency 10/03/2012   Hyperlipidemia LDL goal <70 10/03/2012    Past Medical History:  Diagnosis Date   Abnormal Pap smear 2005   ASCUS +HPV   Anxiety    Anxiety and depression    Depression    Diabetes mellitus without complication (HCC)    Genital warts    Hypertension    Manic bipolar I disorder (HCC)    Syphilis in female     Past Surgical History:  Procedure Laterality Date   CHOLECYSTECTOMY  6/10   with liver infection, also occluded bile duct    Current Outpatient Medications  Medication Sig Dispense Refill   ACCU-CHEK FASTCLIX LANCETS MISC 1 Units by Percutaneous route 4 (four) times daily. 100 each 12   azelastine (OPTIVAR) 0.05 % ophthalmic solution Use as directed as needed for allergies     betamethasone  valerate ointment (VALISONE ) 0.1 % Apply 1 application. topically 2 (two) times daily. Use for up to 2 weeks as needed 30 g 0   buPROPion (WELLBUTRIN XL) 150 MG 24 hr tablet Take 150 mg by mouth.     Continuous Blood Gluc Sensor (FREESTYLE  LIBRE 3 SENSOR) MISC SMARTSIG:1 SUB-Q Every 2 Weeks     dapagliflozin propanediol (FARXIGA) 10 MG TABS tablet Take 10 mg by mouth daily.     Dapagliflozin-metFORMIN HCl ER 11-998 MG TB24 Take by mouth.     Fexofenadine HCl (ALLEGRA PO) Take 1 tablet by mouth daily.     FIASP FLEXTOUCH 100 UNIT/ML FlexTouch Pen Inject into the skin 3 (three) times daily as needed.     fluticasone (FLONASE) 50 MCG/ACT nasal spray Place 1 spray into the nose.     glucose blood (BAYER CONTOUR TEST) test strip Check blood sugar level 4 times daily AM and 2 hours PP 100 each 12   hyoscyamine (LEVSIN SL) 0.125 MG SL tablet DISSOLVE 2 TABLETS IN MOUTH EVERY 4 HOURS AS NEEDED UP TO FOR 30 DAYS FOR CRAMPING     Insulin Degludec (TRESIBA FLEXTOUCH [place]) Inject into the skin. 60 units     metFORMIN (GLUCOPHAGE-XR) 500 MG 24 hr tablet  Take 1,000 mg by mouth 2 (two) times daily with a meal.     montelukast (SINGULAIR) 10 MG tablet Take 10 mg by mouth at bedtime.     pioglitazone (ACTOS) 30 MG tablet Take 1 tablet once a day for diabetes     ROSUVASTATIN CALCIUM PO Take by mouth.     SUMAtriptan  (IMITREX ) 50 MG tablet Take 1 tablet (50 mg total) by mouth every 2 (two) hours as needed for migraine. May repeat in 2 hours if headache persists or recurs. 10 tablet 11   Vitamin D, Ergocalciferol, (DRISDOL) 1.25 MG (50000 UNIT) CAPS capsule Take 50,000 Units by mouth once a week.     VRAYLAR 1.5 MG capsule Take 1.5 mg by mouth daily.     nystatin  (MYCOSTATIN ) 100000 UNIT/ML suspension Take 500,000 Units by mouth. (Patient not taking: Reported on 06/03/2024)     No current facility-administered medications for this visit.     ALLERGIES: Neosporin [neomycin-polymyxin-gramicidin]  Family History  Problem Relation Age of Onset   Hypertension Mother    Cancer Father        lung   Hypertension Father    Stroke Father    Heart disease Father    Breast cancer Paternal Aunt    Diabetes Paternal Grandmother    Cancer Paternal Grandfather        prostate    Social History   Socioeconomic History   Marital status: Married    Spouse name: Not on file   Number of children: Not on file   Years of education: Not on file   Highest education level: Not on file  Occupational History   Not on file  Tobacco Use   Smoking status: Every Day    Current packs/day: 0.00    Types: Cigarettes    Last attempt to quit: 06/07/2020    Years since quitting: 3.9   Smokeless tobacco: Never   Tobacco comments:    less than 1/2 a pack a day  Substance and Sexual Activity   Alcohol use: Not Currently    Alcohol/week: 0.0 standard drinks of alcohol   Drug use: No   Sexual activity: Yes    Partners: Male    Birth control/protection: None  Other Topics Concern   Not on file  Social History Narrative   Right handed   Caffeine-10-12 cups  daily   Social Drivers of Health   Financial Resource Strain: Low Risk  (08/02/2021)   Received from [facility] visits prior to  09/24/2022.   Overall Financial Resource Strain (CARDIA)    Difficulty of Paying Living Expenses: Not very hard  Food Insecurity: Low Risk  (10/25/2022)   Received from [facility]   Hunger Vital Sign    Within the past 12 months, you worried that your food would run out before you got money to buy more: Never true    Within the past 12 months, the food you bought just didn't last and you didn't have money to get more. : Never true  Transportation Needs: Not on file (10/25/2022)  Physical Activity: Inactive (08/02/2021)   Received from [facility] visits prior to 09/24/2022.   Exercise Vital Sign    On average, how many days per week do you engage in moderate to strenuous exercise (like a brisk walk)?: 0 days    On average, how many minutes do you engage in exercise at this level?: 0 min  Stress: Stress Concern Present (08/02/2021)   Received from [facility] visits prior to 09/24/2022.   [employer] of Occupational Health - Occupational Stress Questionnaire    Feeling of Stress : Rather much  Social Connections: Moderately Isolated (08/02/2021)   Received from [facility] visits prior to 09/24/2022.   Social Connection and Isolation Panel    In a typical week, how many times do you talk on the phone with family, friends, or neighbors?: Three times a week    How often do you get together with friends or relatives?: Once a week    How often do you attend church or religious services?: Never    Do you belong to any clubs or organizations such as church groups, unions, fraternal or athletic groups, or school groups?: No    How often do you attend meetings of the clubs or organizations you belong to?: Never    Are you married, widowed, divorced, separated, never married, or living with a  partner?: Married  Intimate Partner Violence: Not At Risk (08/02/2021)   Received from [facility] visits prior to 09/24/2022.   Humiliation, Afraid, Rape, and Kick questionnaire    Within the last year, have you been afraid of your partner or ex-partner?: No    Within the last year, have you been humiliated or emotionally abused in other ways by your partner or ex-partner?: No    Within the last year, have you been kicked, hit, slapped, or otherwise physically hurt by your partner or ex-partner?: No    Within the last year, have you been raped or forced to have any kind of sexual activity by your partner or ex-partner?: No    Review of Systems  Genitourinary:  Positive for vaginal discharge.  All other systems reviewed and are negative.   PHYSICAL EXAMINATION:   BP 120/74 (BP Location: Left Arm, Patient Position: Sitting)   Pulse 95   LMP 05/12/2024 (Exact Date)   SpO2 95%     General appearance: alert, cooperative and appears stated age  Pelvic: External genitalia:  rosy erythema of the vulva              Urethra:  normal appearing urethra with no masses, tenderness or lesions              Bartholins and Skenes: normal                 Vagina: normal appearing vagina with normal color and discharge, no lesions  Cervix: no lesions                Bimanual Exam:  Uterus:  normal size, contour, position, consistency, mobility, non-tender              Adnexa: no mass, fullness, tenderness            Chaperone was present for exam:  Kari HERO, CMA  ASSESSMENT:  Vulvovaginitis.  Recent bacterial vaginosis.  Poorly controlled diabetes.  PLAN:  Wet prep:  yeast and clue cells, negative trichomonas. Diflucan  150 gm po q 72 hours x 3 doses.  Clindamycin cream 2% pv at hs x 7 nights.  Nystatin  ointment and Triamcinolone  ointment to external skin twice daily for up to one week as needed.  She will check on her referral to [facility] Endocrinology. I  encouraged good blood sugar control, using loose fitting and cotton clothing.  Fu prn.   30 min  total time was spent for this patient encounter, including preparation, face-to-face counseling with the patient, coordination of care, and documentation of the encounter.

## 2024-06-26 ENCOUNTER — Ambulatory Visit: Attending: Cardiology | Admitting: Cardiology

## 2024-06-26 ENCOUNTER — Encounter: Payer: Self-pay | Admitting: Cardiology

## 2024-06-26 VITALS — BP 130/83 | HR 86 | Resp 12 | Ht 65.0 in | Wt 333.0 lb

## 2024-06-26 DIAGNOSIS — R002 Palpitations: Secondary | ICD-10-CM

## 2024-06-26 DIAGNOSIS — Z6841 Body Mass Index (BMI) 40.0 and over, adult: Secondary | ICD-10-CM

## 2024-06-26 DIAGNOSIS — E785 Hyperlipidemia, unspecified: Secondary | ICD-10-CM | POA: Diagnosis not present

## 2024-06-26 DIAGNOSIS — Z794 Long term (current) use of insulin: Secondary | ICD-10-CM

## 2024-06-26 DIAGNOSIS — R072 Precordial pain: Secondary | ICD-10-CM | POA: Diagnosis not present

## 2024-06-26 DIAGNOSIS — E66813 Obesity, class 3: Secondary | ICD-10-CM

## 2024-06-26 DIAGNOSIS — E782 Mixed hyperlipidemia: Secondary | ICD-10-CM

## 2024-06-26 DIAGNOSIS — E1165 Type 2 diabetes mellitus with hyperglycemia: Secondary | ICD-10-CM

## 2024-06-26 NOTE — Patient Instructions (Addendum)
 Medication Instructions:  Your physician recommends that you continue on your current medications as directed. Please refer to the Current Medication list given to you today.  *If you need a refill on your cardiac medications before your next appointment, please call your pharmacy*  Testing/Procedures: Echocardiogram  Your physician has requested that you have an echocardiogram. Echocardiography is a painless test that uses sound waves to create images of your heart. It provides your doctor with information about the size and shape of your heart and how well your heart's chambers and valves are working. This procedure takes approximately one hour. There are no restrictions for this procedure. Please do NOT wear cologne, perfume, aftershave, or lotions (deodorant is allowed). Please arrive 15 minutes prior to your appointment time.  Please note: We ask at that you not bring children with you during ultrasound (echo/ vascular) testing. Due to room size and safety concerns, children are not allowed in the ultrasound rooms during exams. Our front office staff cannot provide observation of children in our lobby area while testing is being conducted. An adult accompanying a patient to their appointment will only be allowed in the ultrasound room at the discretion of the ultrasound technician under special circumstances. We apologize for any inconvenience.  Cardiac PET Stress Test - see below for further instructions. Someone will call you to schedule this.   Follow-Up: At [facility], you and your health needs are our priority.  As part of our continuing mission to provide you with exceptional heart care, our providers are all part of one team.  This team includes your primary Cardiologist (physician) and Advanced Practice Providers or APPs (Physician Assistants and Nurse Practitioners) who all work together to provide you with the care you need, when you need it.  Your next appointment:   As  needed with  Gordy Bergamo, MD       Please report to Radiology at the [facility] Main Entrance 30 minutes early for your test.  [address] [place], [place] [address]                         OR   Please report to Radiology at [facility] Main Entrance, medical mall, 30 mins prior to your test.  [address]  [place], [place]  How to Prepare for Your Cardiac PET/CT Stress Test:  Nothing to eat or drink, except water, 3 hours prior to arrival time.  NO caffeine/decaffeinated products, or chocolate 12 hours prior to arrival. (Please note decaffeinated beverages (teas/coffees) still contain caffeine).  If you have caffeine within 12 hours prior, the test will need to be rescheduled.  Medication instructions: Do not take erectile dysfunction medications for 72 hours prior to test (sildenafil, tadalafil) Do not take nitrates (isosorbide mononitrate, Ranexa) the day before or day of test Do not take tamsulosin the day before or morning of test Hold theophylline containing medications for 12 hours. Hold Dipyridamole 48 hours prior to the test.  Diabetic Preparation: If able to eat breakfast prior to 3 hour fasting, you may take all medications, including your insulin. Do not worry if you miss your breakfast dose of insulin - start at your next meal. If you do not eat prior to 3 hour fast-Hold all diabetes (oral and insulin) medications. Patients who wear a continuous glucose monitor MUST remove the device prior to scanning.  You may take your remaining medications with water.  NO perfume,  cologne or lotion on chest or abdomen area. FEMALES - Please avoid wearing dresses to this appointment.  Total time is 1 to 2 hours; you may want to bring reading material for the waiting time.  IF YOU THINK YOU MAY BE PREGNANT, OR ARE NURSING PLEASE INFORM THE TECHNOLOGIST.  In preparation for your appointment, medication and supplies will be purchased.   Appointment availability is limited, so if you need to cancel or reschedule, please call the Radiology Department Scheduler at [phone] 24 hours in advance to avoid a cancellation fee of $100.00  What to Expect When you Arrive:  Once you arrive and check in for your appointment, you will be taken to a preparation room within the Radiology Department.  A technologist or Nurse will obtain your medical history, verify that you are correctly prepped for the exam, and explain the procedure.  Afterwards, an IV will be started in your arm and electrodes will be placed on your skin for EKG monitoring during the stress portion of the exam. Then you will be escorted to the PET/CT scanner.  There, staff will get you positioned on the scanner and obtain a blood pressure and EKG.  During the exam, you will continue to be connected to the EKG and blood pressure machines.  A small, safe amount of a radioactive tracer will be injected in your IV to obtain a series of pictures of your heart along with an injection of a stress agent.    After your Exam:  It is recommended that you eat a meal and drink a caffeinated beverage to counter act any effects of the stress agent.  Drink plenty of fluids for the remainder of the day and urinate frequently for the first couple of hours after the exam.  Your doctor will inform you of your test results within 7-10 business days.  For more information and frequently asked questions, please visit our website: [email or web address]  For questions about your test or how to prepare for your test, please call: Cardiac Imaging Nurse Navigators Office: [phone]

## 2024-06-26 NOTE — Progress Notes (Signed)
 Cardiology Office Note:  .   Date:  06/26/2024  ID:  Diamond Pace, DOB May 06, 1981, MRN [id number] PCP: Jolee Madelin Patch, MD  [facility] Providers Cardiologist:  Gordy Bergamo, MD   History of Present Illness: .   Diamond Pace is a 43 y.o. Hispanic female patient with uncontrolled diabetes mellitus, hypercholesterolemia referred to me for evaluation of palpitations and chest pain and abnormal EKG. episodes started on 05/08/2024 but had been going on a week to 10 days prior to that with severe heartburn, difficulty swallowing given water.  Eventually on 05/08/2024, she had such severe chest pain and marked weakness states that she passed out and slept for the whole night and could not even wake her husband up.  She has multiple anxiety issues and stress at work.  She also endorses chronic fatigue.    Discussed the use of AI scribe software for clinical note transcription with the patient, who gave verbal consent to proceed.  History of Present Illness Diamond Pace is a 42 year old female with GERD and obesity who presents with chest pain and palpitations. She was referred by Dr. Madelin Jolee for evaluation of chest pain and possible cardiac issues.  On October 15th, she had severe arm pain, sweating, burning chest pain, and a sensation of her heart churning and skipping beats, followed by syncope. After regaining consciousness she was fatigued and the chest pain resolved. In the week and a half prior she had worsening heartburn unrelieved by Pepcid, Mylanta, or Zantac, and burning from her chest down even with water. She now has occasional baseline heartburn and intermittent palpitations described as churning with brief pauses before her heart resumes.  She has obesity with major weight loss from 420 pounds, diabetes treated with insulin complicated by gastroparesis, and leg swelling with neuropathic foot pain. She smokes but has reduced since October. Her  physical activity is limited to caring for her daughter and work tasks.  She has significant work-related stress and chronic fatigue. She has not completed testing for suspected sleep apnea.  Cardiac Studies relevent.      Labs  Care everywhere/Faxed External Labs:  Labs 05/09/2024:  A1c 14.5%.  TSH 2.13. Vitamin D 15.6.  Labs 08/30/2023:  Sodium 137, potassium 4.9, BUN 13, creatinine 0.73, eGFR >90 mL.  LFTs normal.  Total cholesterol 165, triglycerides 259, HDL 36, LDL 103.  ROS  Review of Systems  Constitutional: Positive for malaise/fatigue.  Cardiovascular:  Positive for chest pain, dyspnea on exertion and palpitations. Negative for leg swelling.   Physical Exam:   VS:  BP 130/83 (BP Location: Left Arm, Patient Position: Sitting, Cuff Size: Large)   Pulse 86   Resp 12   Ht 5' 5 (1.651 m)   Wt (!) 333 lb (151 kg)   LMP 05/12/2024 (Exact Date)   SpO2 96%   BMI 55.41 kg/m    Wt Readings from Last 3 Encounters:  06/26/24 (!) 333 lb (151 kg)  10/17/23 (!) 334 lb (151.5 kg)  04/13/23 (!) 354 lb (160.6 kg)    BP Readings from Last 3 Encounters:  06/26/24 130/83  06/03/24 120/74  04/24/24 118/80   Physical Exam Constitutional:      Comments: Morbidly obese in no acute distress.  Neck:     Vascular: No carotid bruit or JVD.  Cardiovascular:     Rate and Rhythm: Normal rate and regular rhythm.     Pulses: Intact distal pulses.          Carotid  pulses are 2+ on the right side and 2+ on the left side.    Heart sounds: Normal heart sounds. No murmur heard.    No gallop.  Pulmonary:     Effort: Pulmonary effort is normal.     Breath sounds: Normal breath sounds.  Abdominal:     General: Bowel sounds are normal.     Palpations: Abdomen is soft.     Comments: Obese. Pannus present  Musculoskeletal:     Right lower leg: No edema.     Left lower leg: No edema.    EKG:    EKG Interpretation Date/Time:  Wednesday June 26 2024 14:07:11 EST Ventricular Rate:   88 PR Interval:  142 QRS Duration:  82 QT Interval:  368 QTC Calculation: 445 R Axis:   2  Text Interpretation: EKG 06/26/2024: Normal sinus rhythm with rate of 88 bpm, normal axis.  Poor R wave progression, cannot exclude anteroseptal infarct old.  Low-voltage complexes.  Pulmonary disease pattern.  Compared to PCP EKG 05/09/2024, no significant change. Confirmed by Kalonji Zurawski, Jagadeesh [phone]) on 06/26/2024 2:31:33 PM  EKG 05/09/2024: Normal sinus rhythm at the rate of 74 bpm, normal axis, poor R progression, cannot exclude anteroseptal infarct old.  Low-voltage complexes.  Pulmonary disease pattern.  ASSESSMENT AND PLAN: .      ICD-10-CM   1. Precordial pain  R07.2 EKG 12-Lead    NM PET CT CARDIAC PERFUSION MULTI W/ABSOLUTE BLOODFLOW    ECHOCARDIOGRAM COMPLETE    Cardiac Stress Test: Informed Consent Details: Physician/Practitioner Attestation; Transcribe to consent form and obtain patient signature    AMB Referral to [facility] Pharm-D    2. Palpitations  R00.2 NM PET CT CARDIAC PERFUSION MULTI W/ABSOLUTE BLOODFLOW    ECHOCARDIOGRAM COMPLETE    AMB Referral to [facility] Pharm-D    3. Mixed hyperlipidemia  E78.2 NM PET CT CARDIAC PERFUSION MULTI W/ABSOLUTE BLOODFLOW    ECHOCARDIOGRAM COMPLETE    AMB Referral to [facility] Pharm-D    4. Dyslipidemia, goal LDL below 55  E78.5 AMB Referral to [facility] Pharm-D    5. Type 2 diabetes mellitus with hyperglycemia, with long-term current use of insulin (HCC)  E11.65 NM PET CT CARDIAC PERFUSION MULTI W/ABSOLUTE BLOODFLOW   Z79.4 ECHOCARDIOGRAM COMPLETE    Cardiac Stress Test: Informed Consent Details: Physician/Practitioner Attestation; Transcribe to consent form and obtain patient signature    AMB Referral to [facility] Pharm-D    6. Class 3 severe obesity due to excess calories with serious comorbidity and body mass index (BMI) of 50.0 to 59.9 in adult [facility])  Z33.186 NM PET CT CARDIAC PERFUSION MULTI W/ABSOLUTE BLOODFLOW   Z68.43 ECHOCARDIOGRAM  COMPLETE    AMB Referral to [facility] Pharm-D     Assessment & Plan Precordial pain and palpitations in the setting of high cardiac risk Intermittent chest pain and palpitations likely related to GERD, but high cardiac risk due to obesity, smoking, and diabetes. EKG shows low voltage, likely due to obesity, but no signs of myocardial infarction. Differential includes GERD and potential cardiac issues due to risk factors. 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 High A1c at 14.5%. Current medications include insulin and bupropion. Gastroparesis limits use of certain weight loss medications. Discussed potential use of Rybelsus as an alternative  to Ozempic due to gastroparesis. - Discussed potential use of Rybelsus for weight loss  Class 3 severe obesity with serious comorbidity (BMI 55) Severe obesity with BMI of 55, contributing to high cardiac risk and complicating diabetes management. Discussed lifestyle modifications including dietary changes and increased physical activity. Emphasized the importance of weight loss to improve overall health and reduce risk of complications. - Encouraged dietary modifications and increased physical activity - Discussed potential use of Rybelsus for weight loss  Mixed hyperlipidemia with suboptimal LDL despite statin therapy LDL cholesterol remains elevated at 103 despite 20 mg of rosuvastatin. High cardiac risk necessitates more aggressive lipid management. Discussed initiation of Repatha to achieve LDL goal of less than 55. - Initiated Repatha for cholesterol management - Referred to clinical pharmacist for  Repatha initiation  Suspected obstructive sleep apnea Symptoms of daytime somnolence and fatigue suggest possible obstructive sleep apnea, which could also impact diabetes control. No formal diagnosis yet. - Ordered sleep study to evaluate for obstructive sleep apnea  Counseling regarding obesity and health care issues - I had extensive discussion with the patient that she is extreme high risk with her  poor habits including tobacco use, ongoing excess calories, uncontrolled diabetes mellitus.  She may need referral to wellness clinic and psychotherapy may help.  Follow up: I will personally perform the test and if I find abnormalities,  will perform further evaluation. Otherwise unless new on ongoing symptoms(patient advised to contact us ), preventive  therapy is recommended. I will then see the patient on a PRN basis.  Once her lipids are well-managed, we can release her back to her PCP for further management and continuation of care.  Signed,  Gordy Bergamo, MD, [facility] 06/26/2024, 7:11 PM [facility] [address] [place], [place] [address] Phone: [phone]. Fax:  [phone]

## 2024-07-22 ENCOUNTER — Encounter (HOSPITAL_COMMUNITY): Payer: Self-pay

## 2024-07-23 ENCOUNTER — Telehealth (HOSPITAL_COMMUNITY): Payer: Self-pay | Admitting: *Deleted

## 2024-07-23 NOTE — Telephone Encounter (Signed)

## 2024-07-24 ENCOUNTER — Encounter (HOSPITAL_COMMUNITY)
Admission: RE | Admit: 2024-07-24 | Discharge: 2024-07-24 | Disposition: A | Discharge status: Home / Self Care | Source: Ambulatory Visit | Attending: Cardiology | Admitting: Cardiology

## 2024-07-24 DIAGNOSIS — R002 Palpitations: Secondary | ICD-10-CM | POA: Insufficient documentation

## 2024-07-24 DIAGNOSIS — R072 Precordial pain: Secondary | ICD-10-CM | POA: Diagnosis present

## 2024-07-24 DIAGNOSIS — Z6841 Body Mass Index (BMI) 40.0 and over, adult: Secondary | ICD-10-CM | POA: Diagnosis present

## 2024-07-24 DIAGNOSIS — E1165 Type 2 diabetes mellitus with hyperglycemia: Secondary | ICD-10-CM | POA: Diagnosis present

## 2024-07-24 DIAGNOSIS — E782 Mixed hyperlipidemia: Secondary | ICD-10-CM | POA: Insufficient documentation

## 2024-07-24 DIAGNOSIS — Z794 Long term (current) use of insulin: Secondary | ICD-10-CM | POA: Diagnosis present

## 2024-07-24 DIAGNOSIS — E66813 Obesity, class 3: Secondary | ICD-10-CM | POA: Insufficient documentation

## 2024-07-24 LAB — NM PET CT CARDIAC PERFUSION MULTI W/ABSOLUTE BLOODFLOW
LV dias vol: 105 mL (ref 46–106)
LV sys vol: 46 mL
MBFR: 2.96
Nuc Rest EF: 56 %
Nuc Stress EF: 66 %
Peak HR: 96 {beats}/min
Rest HR: 77 {beats}/min
Rest MBF: 0.75 ml/g/min
Rest Nuclear Isotope Dose: 29.9 mCi
ST Depression (mm): 0 mm
Stress MBF: 2.22 ml/g/min
Stress Nuclear Isotope Dose: 29.9 mCi

## 2024-07-24 MED ORDER — REGADENOSON 0.4 MG/5ML IV SOLN
INTRAVENOUS | Status: AC
  Filled 2024-07-24: qty 5

## 2024-07-24 MED ORDER — RUBIDIUM RB82 GENERATOR (RUBYFILL)
30.0000 | PACK | Freq: Once | INTRAVENOUS | Status: AC
  Administered 2024-07-24: 29.93 via INTRAVENOUS

## 2024-07-24 MED ORDER — REGADENOSON 0.4 MG/5ML IV SOLN
0.4000 mg | Freq: Once | INTRAVENOUS | Status: AC
  Administered 2024-07-24: 0.4 mg via INTRAVENOUS

## 2024-07-25 ENCOUNTER — Ambulatory Visit: Payer: Self-pay | Admitting: Cardiology

## 2024-08-06 NOTE — Progress Notes (Unsigned)
 "  Diamond Pace [birth year] [id number]   History:  44 y.o. [id number] presents for annual exam. Slightly irregular periods. Not new for her. 2005 ASCUS + HR HPV, normal paps since. Smoker. H/O DM, depression, HTN. 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 H/O syphilis ~ 10 years ago.   Gynecologic History No LMP recorded.   Contraception/Family planning: none Sexually active: Yes  Health Maintenance Last Pap: 11/10/2021. Results were: Normal neg HPV Last mammogram: 12/05/2023. Results were: Normal Last colonoscopy: Not indicated Last Dexa: Not indicated      No data to display           Past medical history, past surgical history, family history and social history were all reviewed and documented in the EPIC chart. Married. Works in therapist, sports. 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 83 yo daughter.   ROS:  A ROS was performed and pertinent positives and negatives are included.  Exam:  There were no vitals filed for this visit.  There is no height or weight on file to calculate BMI.  General appearance:  Normal Thyroid:  Symmetrical, normal in size, without palpable masses or nodularity. Respiratory  Auscultation:  Clear without wheezing or rhonchi Cardiovascular  Auscultation:  Regular rate, without rubs, murmurs or gallops  Edema/varicosities:  Not grossly evident Abdominal  Soft,nontender, without masses, guarding or rebound.  Liver/spleen:  No organomegaly noted  Hernia:  None appreciated  Skin  Inspection:  Grossly normal Breasts: Examined lying and sitting.   Right: ~5 mm, oval mass in outer right breast (c/w previous fibroadenoma seen on imaging). No retractions, nipple discharge or axillary adenopathy.   Left: Without masses, retractions, nipple discharge or axillary adenopathy. Pelvic: External genitalia:  no lesions              Urethra:  normal appearing urethra with no masses, tenderness or lesions              Bartholins and Skenes: normal                  Vagina: normal appearing vagina with normal color and discharge, no lesions              Cervix: no lesions Bimanual Exam:  Uterus:  Difficult to palpate due to body habitus but no gross masses or tenderness              Adnexa: no mass, fullness, tenderness              Rectovaginal: Deferred              Anus:  normal, no lesions   Patient informed chaperone available to be present for breast and pelvic exam. Patient has requested no chaperone to be present. Patient has been advised what will be completed during breast and pelvic exam.   Assessment/Plan:  44 y.o. H5E7977 for annual exam.   Well female exam with routine gynecological exam - Education provided on SBEs, importance of preventative screenings, current guidelines, high calcium diet, regular exercise, and multivitamin daily. Labs with PCP.   HSV-2 (herpes simplex virus 2) infection - reviewed results and accuracy of culture. Educated on use of Valtrex. Take a first sign of outbreak BID x 3-5 days.   Screening for cervical cancer - Normal Pap history.  Will repeat at 5-year interval per guidelines.  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 Continue annual screenings. Normal breast exam today.  No follow-ups on file.     Diamond Pace A  Prentiss DNP, 3:05 PM 08/06/2024  "

## 2024-08-07 ENCOUNTER — Ambulatory Visit: Admitting: Nurse Practitioner

## 2024-08-07 ENCOUNTER — Encounter: Payer: Self-pay | Admitting: Nurse Practitioner

## 2024-08-07 VITALS — BP 118/80 | HR 78 | Ht 65.25 in | Wt 327.0 lb

## 2024-08-07 DIAGNOSIS — Z1331 Encounter for screening for depression: Secondary | ICD-10-CM | POA: Diagnosis not present

## 2024-08-07 DIAGNOSIS — N898 Other specified noninflammatory disorders of vagina: Secondary | ICD-10-CM

## 2024-08-07 DIAGNOSIS — L0292 Furuncle, unspecified: Secondary | ICD-10-CM | POA: Diagnosis not present

## 2024-08-07 DIAGNOSIS — E1165 Type 2 diabetes mellitus with hyperglycemia: Secondary | ICD-10-CM | POA: Diagnosis not present

## 2024-08-07 DIAGNOSIS — Z01419 Encounter for gynecological examination (general) (routine) without abnormal findings: Secondary | ICD-10-CM

## 2024-08-07 DIAGNOSIS — B9689 Other specified bacterial agents as the cause of diseases classified elsewhere: Secondary | ICD-10-CM | POA: Diagnosis not present

## 2024-08-07 DIAGNOSIS — N760 Acute vaginitis: Secondary | ICD-10-CM | POA: Diagnosis not present

## 2024-08-07 DIAGNOSIS — B009 Herpesviral infection, unspecified: Secondary | ICD-10-CM

## 2024-08-07 LAB — WET PREP FOR TRICH, YEAST, CLUE

## 2024-08-07 MED ORDER — FLUCONAZOLE 150 MG PO TABS: 150.0000 mg | ORAL_TABLET | ORAL | 0 refills | Status: AC

## 2024-08-07 MED ORDER — METRONIDAZOLE 500 MG PO TABS: 500.0000 mg | ORAL_TABLET | Freq: Two times a day (BID) | ORAL | 0 refills | Status: AC

## 2024-08-08 ENCOUNTER — Ambulatory Visit (HOSPITAL_COMMUNITY)
Admission: RE | Admit: 2024-08-08 | Discharge: 2024-08-08 | Disposition: A | Discharge status: Home / Self Care | Source: Ambulatory Visit | Attending: Internal Medicine | Admitting: Internal Medicine

## 2024-08-08 DIAGNOSIS — Z6841 Body Mass Index (BMI) 40.0 and over, adult: Secondary | ICD-10-CM | POA: Insufficient documentation

## 2024-08-08 DIAGNOSIS — E1165 Type 2 diabetes mellitus with hyperglycemia: Secondary | ICD-10-CM | POA: Diagnosis present

## 2024-08-08 DIAGNOSIS — R072 Precordial pain: Secondary | ICD-10-CM | POA: Diagnosis present

## 2024-08-08 DIAGNOSIS — R002 Palpitations: Secondary | ICD-10-CM | POA: Insufficient documentation

## 2024-08-08 DIAGNOSIS — E782 Mixed hyperlipidemia: Secondary | ICD-10-CM | POA: Diagnosis present

## 2024-08-08 DIAGNOSIS — E66813 Obesity, class 3: Secondary | ICD-10-CM | POA: Diagnosis present

## 2024-08-08 DIAGNOSIS — Z794 Long term (current) use of insulin: Secondary | ICD-10-CM | POA: Diagnosis present

## 2024-08-08 LAB — ECHOCARDIOGRAM COMPLETE
AR max vel: 2.44 cm2
AV Area VTI: 2.58 cm2
AV Area mean vel: 2.57 cm2
AV Mean grad: 6 mmHg
AV Peak grad: 11 mmHg
Ao pk vel: 1.66 m/s
Area-P 1/2: 2.86 cm2
Calc EF: 63.1 %
S' Lateral: 3.2 cm
Single Plane A2C EF: 59.8 %
Single Plane A4C EF: 64.2 %

## 2024-08-08 MED ORDER — PERFLUTREN LIPID MICROSPHERE
1.0000 mL | INTRAVENOUS | Status: AC | PRN
  Administered 2024-08-08: 2 mL via INTRAVENOUS

## 2024-08-20 NOTE — Progress Notes (Unsigned)
 Patient ID: Diamond Pace                 DOB: 1981/07/08                    MRN: [id number]      HPI: Diamond Pace is a 44 y.o. female patient referred to lipid clinic by Dr. Ladona. PMH is significant for Obesity, OSA, T2DM, tobacco use and HLD  Since Oct 2025, patient has had intermittent chest pain and palpitations likely related to GERD, but high cardiac risk due to obesity, smoking, and diabetes. Further work up with EKG showed no signs of myocardial infarction. Normal nuclear stress test and normal ECHO as well.   LDL is elevated at 103 (08/30/23) despite being on  rosuvastatin 20mg .   Dr. Ladona: High cardiac risk necessitates more aggressive lipid management. Discussed initiation of Repatha to achieve LDL goal of less than 55  Reviewed options for lowering LDL cholesterol, including ezetimibe, PCSK-9 inhibitors, bempedoic acid and inclisiran.  Discussed mechanisms of action, dosing, side effects and potential decreases in LDL cholesterol.  Also reviewed cost information and potential options for patient assistance.  Current Medications: Rosuvastatin 20 mg  Intolerances:  Risk Factors: Obesity, tobacco use, T2DM LDL goal: <100?  Diet:   Exercise:   Family History:  Relation Problem Comments  Mother (Alive) Hypertension     Father (Deceased) Cancer lung  Heart disease   Hypertension   Stroke     Paternal Aunt (Deceased) Breast cancer     Paternal Grandmother Diabetes     Paternal Grandfather Cancer prostate     Social History:  Alcohol: Smoking:  Labs:  Lipid Panel (08/30/23)  Total: 165 Trig: 159 LDL: 103 HDL: 36  No results found for: CHOL, TRIG, HDL, CHOLHDL, VLDL, LDLCALC, LDLDIRECT, LABVLDL  Past Medical History:  Diagnosis Date   Abnormal Pap smear 2005   ASCUS +HPV   Anxiety    Anxiety and depression    Depression    Diabetes mellitus without complication (HCC)    Genital warts    Hypertension    Manic  bipolar I disorder (HCC)    Syphilis in female     Medications Ordered Prior to Encounter[1]  Allergies[2]  Assessment/Plan:  1. Hyperlipidemia -  No problems updated. No problem-specific Assessment & Plan notes found for this encounter.    Thank you, Mariya Mottley, PharmD Candidate   Robbi Blanch, Pharm.D [facility]. [facility] [address], [place], [place] [address] Phone: [phone]; Fax: [phone]        [1]  Current Outpatient Medications on File Prior to Visit  Medication Sig Dispense Refill   ACCU-CHEK FASTCLIX LANCETS MISC 1 Units by Percutaneous route 4 (four) times daily. 100 each 12   azelastine (OPTIVAR) 0.05 % ophthalmic solution Use as directed as needed for allergies     buPROPion (WELLBUTRIN XL) 150 MG 24 hr tablet Take 150 mg by mouth.     Continuous Blood Gluc Sensor (FREESTYLE LIBRE 3 SENSOR) MISC SMARTSIG:1 SUB-Q Every 2 Weeks     dapagliflozin propanediol (FARXIGA) 10 MG TABS tablet Take 10 mg by mouth daily.     Dapagliflozin-metFORMIN HCl ER 11-998 MG TB24 Take by mouth.     Fexofenadine HCl (ALLEGRA PO) Take 1 tablet by mouth daily.     FIASP FLEXTOUCH 100 UNIT/ML FlexTouch Pen Inject into the skin 3 (three) times daily as needed.  fluconazole  (DIFLUCAN ) 150 MG tablet Take 1 tablet (150 mg total) by mouth every 3 (three) days. 2 tablet 0   fluticasone (FLONASE) 50 MCG/ACT nasal spray Place 1 spray into the nose.     glucose blood (BAYER CONTOUR TEST) test strip Check blood sugar level 4 times daily AM and 2 hours PP 100 each 12   hyoscyamine (LEVSIN SL) 0.125 MG SL tablet DISSOLVE 2 TABLETS IN MOUTH EVERY 4 HOURS AS NEEDED UP TO FOR 30 DAYS FOR CRAMPING     Insulin Degludec (TRESIBA FLEXTOUCH [place]) Inject into the skin. 60 units     metFORMIN (GLUCOPHAGE-XR) 500 MG 24 hr tablet Take 1,000 mg by mouth 2 (two) times daily with a meal.     metroNIDAZOLE  (FLAGYL ) 500 MG tablet Take 1 tablet (500  mg total) by mouth 2 (two) times daily. 14 tablet 0   montelukast (SINGULAIR) 10 MG tablet Take 10 mg by mouth at bedtime.     pioglitazone (ACTOS) 30 MG tablet Take 1 tablet once a day for diabetes     rosuvastatin (CRESTOR) 20 MG tablet Take 20 mg by mouth daily.     SUMAtriptan  (IMITREX ) 50 MG tablet Take 1 tablet (50 mg total) by mouth every 2 (two) hours as needed for migraine. May repeat in 2 hours if headache persists or recurs. 10 tablet 11   VRAYLAR 1.5 MG capsule Take 1.5 mg by mouth daily.     No current facility-administered medications on file prior to visit.  [2]  Allergies Allergen Reactions   Neosporin [Neomycin-Polymyxin-Gramicidin] Dermatitis and Rash

## 2024-08-21 ENCOUNTER — Ambulatory Visit: Admitting: Pharmacist

## 2024-09-26 ENCOUNTER — Ambulatory Visit: Admitting: Pharmacist

## 2024-12-12 ENCOUNTER — Ambulatory Visit: Admitting: "Endocrinology
